# Patient Record
Sex: Female | Born: 1950 | Hispanic: No
Health system: Southern US, Community
[De-identification: ages and names within clinical notes are randomized; demographics above are authoritative.]

## PROBLEM LIST (undated history)

## (undated) DIAGNOSIS — S0990XA Unspecified injury of head, initial encounter: Secondary | ICD-10-CM

## (undated) DIAGNOSIS — E119 Type 2 diabetes mellitus without complications: Secondary | ICD-10-CM

## (undated) DIAGNOSIS — F419 Anxiety disorder, unspecified: Secondary | ICD-10-CM

## (undated) DIAGNOSIS — E785 Hyperlipidemia, unspecified: Secondary | ICD-10-CM

## (undated) DIAGNOSIS — F32A Depression, unspecified: Secondary | ICD-10-CM

## (undated) DIAGNOSIS — K219 Gastro-esophageal reflux disease without esophagitis: Secondary | ICD-10-CM

## (undated) DIAGNOSIS — F329 Major depressive disorder, single episode, unspecified: Secondary | ICD-10-CM

## (undated) HISTORY — DX: Hyperlipidemia, unspecified: E78.5

## (undated) HISTORY — PX: ABDOMINAL HYSTERECTOMY: SHX81

## (undated) HISTORY — DX: Depression, unspecified: F32.A

## (undated) HISTORY — DX: Anxiety disorder, unspecified: F41.9

## (undated) HISTORY — DX: Major depressive disorder, single episode, unspecified: F32.9

## (undated) HISTORY — PX: TUBAL LIGATION: SHX77

## (undated) HISTORY — PX: BACK SURGERY: SHX140

---

## 1998-06-04 ENCOUNTER — Other Ambulatory Visit: Admission: RE | Admit: 1998-06-04 | Discharge: 1998-06-04 | Payer: Self-pay | Admitting: Obstetrics and Gynecology

## 1999-02-13 ENCOUNTER — Inpatient Hospital Stay (HOSPITAL_COMMUNITY): Admission: RE | Admit: 1999-02-13 | Discharge: 1999-02-14 | Payer: Self-pay | Admitting: Neurosurgery

## 1999-02-13 ENCOUNTER — Encounter: Payer: Self-pay | Admitting: Neurosurgery

## 2000-03-24 ENCOUNTER — Other Ambulatory Visit: Admission: RE | Admit: 2000-03-24 | Discharge: 2000-03-24 | Payer: Self-pay | Admitting: Obstetrics and Gynecology

## 2000-04-12 ENCOUNTER — Encounter: Payer: Self-pay | Admitting: Neurology

## 2000-04-12 ENCOUNTER — Encounter: Admission: RE | Admit: 2000-04-12 | Discharge: 2000-04-12 | Payer: Self-pay | Admitting: Neurology

## 2000-05-25 ENCOUNTER — Inpatient Hospital Stay (HOSPITAL_COMMUNITY): Admission: EM | Admit: 2000-05-25 | Discharge: 2000-06-04 | Payer: Self-pay

## 2000-05-25 ENCOUNTER — Encounter: Payer: Self-pay | Admitting: General Surgery

## 2000-05-26 ENCOUNTER — Encounter: Payer: Self-pay | Admitting: General Surgery

## 2000-05-27 ENCOUNTER — Encounter: Payer: Self-pay | Admitting: General Surgery

## 2000-06-03 ENCOUNTER — Encounter: Payer: Self-pay | Admitting: General Surgery

## 2000-07-05 ENCOUNTER — Other Ambulatory Visit: Admission: RE | Admit: 2000-07-05 | Discharge: 2000-07-05 | Payer: Self-pay | Admitting: Gynecology

## 2001-01-28 ENCOUNTER — Emergency Department (HOSPITAL_COMMUNITY): Admission: EM | Admit: 2001-01-28 | Discharge: 2001-01-28 | Payer: Self-pay | Admitting: Emergency Medicine

## 2001-02-09 ENCOUNTER — Encounter: Admission: RE | Admit: 2001-02-09 | Discharge: 2001-02-09 | Payer: Self-pay

## 2001-04-11 ENCOUNTER — Encounter: Admission: RE | Admit: 2001-04-11 | Discharge: 2001-04-11 | Payer: Self-pay | Admitting: Internal Medicine

## 2001-05-30 ENCOUNTER — Inpatient Hospital Stay (HOSPITAL_COMMUNITY): Admission: AD | Admit: 2001-05-30 | Discharge: 2001-05-30 | Payer: Self-pay | Admitting: Obstetrics

## 2001-08-17 ENCOUNTER — Encounter: Admission: RE | Admit: 2001-08-17 | Discharge: 2001-08-17 | Payer: Self-pay

## 2001-08-17 ENCOUNTER — Encounter (INDEPENDENT_AMBULATORY_CARE_PROVIDER_SITE_OTHER): Payer: Self-pay | Admitting: Internal Medicine

## 2001-08-17 ENCOUNTER — Other Ambulatory Visit: Admission: RE | Admit: 2001-08-17 | Discharge: 2001-09-02 | Payer: Self-pay | Admitting: Obstetrics & Gynecology

## 2001-11-02 ENCOUNTER — Encounter: Admission: RE | Admit: 2001-11-02 | Discharge: 2001-11-02 | Payer: Self-pay

## 2001-11-14 ENCOUNTER — Ambulatory Visit (HOSPITAL_COMMUNITY): Admission: RE | Admit: 2001-11-14 | Discharge: 2001-11-14 | Payer: Self-pay | Admitting: Obstetrics

## 2002-01-05 ENCOUNTER — Emergency Department (HOSPITAL_COMMUNITY): Admission: EM | Admit: 2002-01-05 | Discharge: 2002-01-05 | Payer: Self-pay | Admitting: Emergency Medicine

## 2002-01-05 ENCOUNTER — Encounter: Payer: Self-pay | Admitting: Emergency Medicine

## 2002-06-09 ENCOUNTER — Encounter: Admission: RE | Admit: 2002-06-09 | Discharge: 2002-06-09 | Payer: Self-pay | Admitting: Internal Medicine

## 2002-08-18 ENCOUNTER — Emergency Department (HOSPITAL_COMMUNITY): Admission: EM | Admit: 2002-08-18 | Discharge: 2002-08-18 | Payer: Self-pay

## 2002-11-22 ENCOUNTER — Encounter: Admission: RE | Admit: 2002-11-22 | Discharge: 2002-11-22 | Payer: Self-pay | Admitting: Internal Medicine

## 2002-11-22 ENCOUNTER — Ambulatory Visit (HOSPITAL_COMMUNITY): Admission: RE | Admit: 2002-11-22 | Discharge: 2002-11-22 | Payer: Self-pay | Admitting: Internal Medicine

## 2002-11-22 ENCOUNTER — Encounter: Payer: Self-pay | Admitting: Internal Medicine

## 2003-01-05 ENCOUNTER — Emergency Department (HOSPITAL_COMMUNITY): Admission: EM | Admit: 2003-01-05 | Discharge: 2003-01-05 | Payer: Self-pay | Admitting: Emergency Medicine

## 2003-02-11 ENCOUNTER — Emergency Department (HOSPITAL_COMMUNITY): Admission: EM | Admit: 2003-02-11 | Discharge: 2003-02-11 | Payer: Self-pay | Admitting: Emergency Medicine

## 2003-02-15 ENCOUNTER — Encounter: Admission: RE | Admit: 2003-02-15 | Discharge: 2003-02-15 | Payer: Self-pay | Admitting: Internal Medicine

## 2003-02-27 ENCOUNTER — Encounter: Admission: RE | Admit: 2003-02-27 | Discharge: 2003-03-28 | Payer: Self-pay | Admitting: *Deleted

## 2003-03-28 ENCOUNTER — Encounter: Admission: RE | Admit: 2003-03-28 | Discharge: 2003-03-28 | Payer: Self-pay | Admitting: Internal Medicine

## 2003-11-12 ENCOUNTER — Emergency Department (HOSPITAL_COMMUNITY): Admission: EM | Admit: 2003-11-12 | Discharge: 2003-11-12 | Payer: Self-pay | Admitting: Emergency Medicine

## 2003-12-07 ENCOUNTER — Encounter: Admission: RE | Admit: 2003-12-07 | Discharge: 2003-12-07 | Payer: Self-pay | Admitting: Internal Medicine

## 2004-01-02 ENCOUNTER — Ambulatory Visit (HOSPITAL_COMMUNITY): Admission: RE | Admit: 2004-01-02 | Discharge: 2004-01-02 | Payer: Self-pay | Admitting: Internal Medicine

## 2005-01-12 ENCOUNTER — Emergency Department (HOSPITAL_COMMUNITY): Admission: EM | Admit: 2005-01-12 | Discharge: 2005-01-12 | Payer: Self-pay | Admitting: Family Medicine

## 2005-01-21 ENCOUNTER — Ambulatory Visit: Payer: Self-pay | Admitting: Internal Medicine

## 2005-05-22 ENCOUNTER — Ambulatory Visit: Payer: Self-pay | Admitting: Internal Medicine

## 2006-03-16 ENCOUNTER — Ambulatory Visit: Payer: Self-pay | Admitting: Internal Medicine

## 2006-03-25 ENCOUNTER — Ambulatory Visit (HOSPITAL_COMMUNITY): Admission: RE | Admit: 2006-03-25 | Discharge: 2006-03-25 | Payer: Self-pay | Admitting: Internal Medicine

## 2006-04-07 ENCOUNTER — Ambulatory Visit: Payer: Self-pay | Admitting: Internal Medicine

## 2006-04-23 ENCOUNTER — Ambulatory Visit: Payer: Self-pay | Admitting: Internal Medicine

## 2006-05-24 ENCOUNTER — Ambulatory Visit: Payer: Self-pay | Admitting: Internal Medicine

## 2006-09-28 ENCOUNTER — Encounter (INDEPENDENT_AMBULATORY_CARE_PROVIDER_SITE_OTHER): Payer: Self-pay | Admitting: Internal Medicine

## 2006-09-28 DIAGNOSIS — M549 Dorsalgia, unspecified: Secondary | ICD-10-CM | POA: Insufficient documentation

## 2006-09-28 DIAGNOSIS — F411 Generalized anxiety disorder: Secondary | ICD-10-CM | POA: Insufficient documentation

## 2006-09-28 DIAGNOSIS — F3289 Other specified depressive episodes: Secondary | ICD-10-CM | POA: Insufficient documentation

## 2006-09-28 DIAGNOSIS — J309 Allergic rhinitis, unspecified: Secondary | ICD-10-CM | POA: Insufficient documentation

## 2006-09-28 DIAGNOSIS — F329 Major depressive disorder, single episode, unspecified: Secondary | ICD-10-CM

## 2006-09-28 DIAGNOSIS — N951 Menopausal and female climacteric states: Secondary | ICD-10-CM

## 2006-11-02 DIAGNOSIS — E785 Hyperlipidemia, unspecified: Secondary | ICD-10-CM | POA: Insufficient documentation

## 2006-11-24 ENCOUNTER — Telehealth: Payer: Self-pay | Admitting: *Deleted

## 2006-12-23 ENCOUNTER — Ambulatory Visit: Payer: Self-pay | Admitting: Internal Medicine

## 2006-12-23 ENCOUNTER — Encounter (INDEPENDENT_AMBULATORY_CARE_PROVIDER_SITE_OTHER): Payer: Self-pay | Admitting: Unknown Physician Specialty

## 2006-12-23 DIAGNOSIS — K219 Gastro-esophageal reflux disease without esophagitis: Secondary | ICD-10-CM | POA: Insufficient documentation

## 2006-12-23 DIAGNOSIS — R209 Unspecified disturbances of skin sensation: Secondary | ICD-10-CM

## 2007-03-24 ENCOUNTER — Telehealth: Payer: Self-pay | Admitting: *Deleted

## 2007-03-28 ENCOUNTER — Ambulatory Visit (HOSPITAL_COMMUNITY): Admission: RE | Admit: 2007-03-28 | Discharge: 2007-03-28 | Payer: Self-pay | Admitting: Internal Medicine

## 2007-05-13 ENCOUNTER — Telehealth: Payer: Self-pay | Admitting: *Deleted

## 2007-05-31 ENCOUNTER — Ambulatory Visit: Payer: Self-pay | Admitting: Internal Medicine

## 2007-09-08 ENCOUNTER — Telehealth: Payer: Self-pay | Admitting: *Deleted

## 2007-09-20 ENCOUNTER — Telehealth: Payer: Self-pay | Admitting: Internal Medicine

## 2008-01-05 ENCOUNTER — Encounter (INDEPENDENT_AMBULATORY_CARE_PROVIDER_SITE_OTHER): Payer: Self-pay | Admitting: *Deleted

## 2008-01-05 ENCOUNTER — Ambulatory Visit: Payer: Self-pay | Admitting: Infectious Diseases

## 2008-01-13 ENCOUNTER — Encounter (INDEPENDENT_AMBULATORY_CARE_PROVIDER_SITE_OTHER): Payer: Self-pay | Admitting: *Deleted

## 2008-01-13 ENCOUNTER — Ambulatory Visit: Payer: Self-pay | Admitting: Hospitalist

## 2008-01-16 LAB — CONVERTED CEMR LAB
HDL: 54 mg/dL (ref >39)
LDL Cholesterol: 95 mg/dL (ref 0–99)

## 2008-03-28 ENCOUNTER — Ambulatory Visit (HOSPITAL_COMMUNITY): Admission: RE | Admit: 2008-03-28 | Discharge: 2008-03-28 | Payer: Self-pay | Admitting: Internal Medicine

## 2008-03-28 LAB — HM MAMMOGRAPHY: HM Mammogram: NEGATIVE

## 2008-06-08 ENCOUNTER — Telehealth (INDEPENDENT_AMBULATORY_CARE_PROVIDER_SITE_OTHER): Payer: Self-pay | Admitting: Internal Medicine

## 2008-09-25 ENCOUNTER — Telehealth: Payer: Self-pay | Admitting: Internal Medicine

## 2008-10-04 ENCOUNTER — Telehealth: Payer: Self-pay | Admitting: *Deleted

## 2008-11-26 ENCOUNTER — Encounter: Payer: Self-pay | Admitting: Internal Medicine

## 2008-11-26 ENCOUNTER — Ambulatory Visit: Payer: Self-pay | Admitting: Internal Medicine

## 2008-11-26 ENCOUNTER — Telehealth: Payer: Self-pay | Admitting: *Deleted

## 2008-11-29 DIAGNOSIS — S0990XA Unspecified injury of head, initial encounter: Secondary | ICD-10-CM | POA: Insufficient documentation

## 2009-01-07 ENCOUNTER — Telehealth: Payer: Self-pay | Admitting: *Deleted

## 2009-01-22 ENCOUNTER — Telehealth (INDEPENDENT_AMBULATORY_CARE_PROVIDER_SITE_OTHER): Payer: Self-pay | Admitting: Internal Medicine

## 2009-04-03 ENCOUNTER — Telehealth: Payer: Self-pay | Admitting: Internal Medicine

## 2010-07-28 ENCOUNTER — Ambulatory Visit: Payer: Self-pay | Admitting: Internal Medicine

## 2010-07-29 LAB — CONVERTED CEMR LAB
ALT: 35 units/L (ref 0–35)
Albumin: 4.3 g/dL (ref 3.5–5.2)
CO2: 28 meq/L (ref 19–32)
Chloride: 104 meq/L (ref 96–112)
Cholesterol: 230 mg/dL — ABNORMAL HIGH (ref 0–200)
MCV: 79.7 fL (ref >=78.0)
Platelets: 276 10*3/uL (ref 150–400)
Potassium: 4 meq/L (ref 3.5–5.3)
Sodium: 143 meq/L (ref 135–145)
Total Bilirubin: 0.6 mg/dL (ref 0.3–1.2)
Total CHOL/HDL Ratio: 4.2
Total Protein: 6.9 g/dL (ref 6.0–8.3)
Triglycerides: 73 mg/dL (ref <150)
WBC: 5.9 10*3/uL (ref 4.0–10.5)

## 2010-07-31 ENCOUNTER — Telehealth: Payer: Self-pay | Admitting: Internal Medicine

## 2010-10-29 ENCOUNTER — Encounter: Payer: Self-pay | Admitting: Internal Medicine

## 2010-11-25 NOTE — Assessment & Plan Note (Signed)
Summary: EST-CK/FU/MEDS/CFB   Vital Signs:  Patient profile:   60 year old female Height:      64 inches (162.56 cm) Weight:      201.6 pounds (91.64 kg) BMI:     34.73 Temp:     97.1 degrees F (36.17 degrees C) oral Pulse rate:   75 / minute BP sitting:   118 / 81  (right arm)  Vitals Entered By: Stanton Kidney Ditzler RN (July 28, 2010 9:15 AM) Is Patient Diabetic? No Pain Assessment Patient in pain? yes     Location: head and right arm Intensity: 3 Type: nagging Onset of pain  past year Nutritional Status BMI of > 30 = obese Nutritional Status Detail appetite good  Have you ever been in a relationship where you felt threatened, hurt or afraid?denies   Does patient need assistance? Functional Status Self care Ambulation Normal Comments Needs med for seasonal allergies. Discuss right arm pain, h/a and back pain. Refills on meds.   Primary Care Provider:  Laren Everts MD   History of Present Illness: 60 yr old woman with pmhx as described below comes to the clinic for follow up. Reports that now that is getiing cold her back is acting up. Denies any bowel or bladder incontinence. Reports that she hasn't taken anything for pain in about 7 months. Her pain was controlled with Ibuprofen.   Patient had a Colonoscopy in 2009 which was normal.   Depression History:      The patient denies a depressed mood most of the day and a diminished interest in her usual daily activities.         Preventive Screening-Counseling & Management  Alcohol-Tobacco     Smoking Status: never  Caffeine-Diet-Exercise     Does Patient Exercise: no     Type of exercise:   GYM  Problems Prior to Update: 1)  Head Trauma, Mild  (ICD-959.01) 2)  Fall From Other Slipping Tripping or Stumbling  (ICD-E885.9) 3)  Hx of Gerd  (ICD-530.81) 4)  Hx of Numbness  (ICD-782.0) 5)  Hyperlipidemia  (ICD-272.4) 6)  Hot Flashes  (ICD-627.2) 7)  Screen For Condition Nos  (ICD-V82.9) 8)  Back Pain   (ICD-724.5) 9)  Depression  (ICD-311) 10)  Anxiety  (ICD-300.00) 11)  Allergic Rhinitis  (ICD-477.9)  Medications Prior to Update: 1)  Zyrtec  Tabs (Cetirizine Hcl Tabs) .... Take 1 Tablet By Mouth Once A Day 2)  Xanax 0.25 Mg Tabs (Alprazolam) .... Two Times A Day As Needed 3)  Zocor 10 Mg Tabs (Simvastatin) .... Take 1 Tablet By Mouth Once A Day 4)  Ibuprofen 800 Mg Tabs (Ibuprofen) .... Take 1 Tablet By Mouth Three Times A Day As Needed For Pain 5)  Neurontin 300 Mg Caps (Gabapentin) .... Take 1 Tab At Bedtime 6)  Prilosec 20 Mg  Cpdr (Omeprazole) .... Take 1 Tablet By Mouth Once A Day  Current Medications (verified): 1)  Zocor 10 Mg Tabs (Simvastatin) .... Take 1 Tablet By Mouth Once A Day 2)  Ibuprofen 800 Mg Tabs (Ibuprofen) .... Take 1 Tablet By Mouth Three Times A Day As Needed For Pain  Allergies: 1)  ! Aspirin  Past History:  Past Medical History: Last updated: 09/28/2006 Allergic rhinitis Anxiety Depression Hyperlipidemia  Social History: Last updated: 01/05/2008 Works as nanny  Risk Factors: Exercise: no (07/28/2010)  Risk Factors: Smoking Status: never (07/28/2010)  Review of Systems       The patient complains of weight gain.  The  patient denies fever, chest pain, dyspnea on exertion, hemoptysis, abdominal pain, melena, hematochezia, hematuria, and difficulty walking.    Physical Exam  General:  Well-developed,well-nourished,in no acute distress; alert,appropriate and cooperative throughout examination Mouth:  normal OP Neck:  supple and no masses.   Lungs:  normal respiratory effort, normal breath sounds, and no wheezes.   Heart:  normal rate, regular rhythm, and no murmur.   Abdomen:  soft, non-tender, and normal bowel sounds.   Msk:  ttp lumbar paraspinal area, no point tenderness, no joint swelling, no joint warmth, no redness over joints, no joint deformities, and no joint instability.   Extremities:  no edema Neurologic:   Nonfocal   Impression & Recommendations:  Problem # 1:  BACK PAIN (ICD-724.5) Stable. No red flags concerns for cauda equina syndrome.  Her updated medication list for this problem includes:    Ibuprofen 800 Mg Tabs (Ibuprofen) .Marland Kitchen... Take 1 tablet by mouth three times a day as needed for pain  Problem # 2:  HYPERLIPIDEMIA (ICD-272.4) Check labs and restart patient on statin.   Her updated medication list for this problem includes:    Zocor 10 Mg Tabs (Simvastatin) .Marland Kitchen... Take 1 tablet by mouth once a day  Orders: T-CMP with Estimated GFR ([id number]) T-Lipid Profile ([id number]) T-CBC No Diff ([id number]) T-TSH ([id number])  Problem # 3:  Preventive Health Care (ICD-V70.0) Will order Mammogram. Colonsocopy uptodate.  Complete Medication List: 1)  Zocor 10 Mg Tabs (Simvastatin) .... Take 1 tablet by mouth once a day 2)  Ibuprofen 800 Mg Tabs (Ibuprofen) .... Take 1 tablet by mouth three times a day as needed for pain  Other Orders: Mammogram (Screening) (Mammo)  Patient Instructions: 1)  Please schedule a follow-up appointment in 6 months. 2)  Take all medication as directed. 3)  You will be called with any abnormalities in the tests scheduled or performed today.  If you don't hear from [place] within a week from when the test was performed, you can assume that your test was normal.  Prescriptions: ZOCOR 10 MG TABS (SIMVASTATIN) Take 1 tablet by mouth once a day  #30 x 3   Entered and Authorized by:   Laren Everts MD   Signed by:   Laren Everts MD on 07/28/2010   Method used:   Print then Give to Patient   RxID:   [id number] IBUPROFEN 800 MG TABS (IBUPROFEN) Take 1 tablet by mouth three times a day as needed for pain  #90 Tablet x 6   Entered and Authorized by:   Laren Everts MD   Signed by:   Laren Everts MD on 07/28/2010   Method used:   Print then Give to Patient   RxID:   [id number]  Process Orders Tests Sent for  requisitioning (July 28, 2010 9:11 PM):     07/28/2010: [facility] -- T-CMP with Estimated GFR [[id number]] (signed)     07/28/2010: [facility] -- T-Lipid Profile [id number] (signed)     07/28/2010: [facility] -- T-CBC No Diff [[id number]] (signed)     07/28/2010: [facility] -- T-TSH [[id number]] (signed)    Prevention & Chronic Care Immunizations   Influenza vaccine: Not documented   Influenza vaccine deferral: Refused  (07/28/2010)    Tetanus booster: Not documented    Pneumococcal vaccine: Not documented  Colorectal Screening   Hemoccult: Not documented    Colonoscopy: Not documented  Other Screening   Pap smear: Normal  (08/17/2001)    Mammogram: Assessment:  BIRADS 1. [facility]     (03/28/2008)   Mammogram action/deferral: Ordered  (07/28/2010)   Smoking status: never  (07/28/2010)  Lipids   Total Cholesterol: 161  (01/13/2008)   LDL: 95  (01/13/2008)   LDL Direct: Not documented   HDL: 54  (01/13/2008)   Triglycerides: 59  (01/13/2008)    SGOT (AST): Not documented   SGPT (ALT): Not documented   Alkaline phosphatase: Not documented   Total bilirubin: Not documented    Lipid flowsheet reviewed?: Yes   Progress toward LDL goal: Unchanged  Self-Management Support :   Personal Goals (by the next clinic visit) :      Personal LDL goal: 100  (07/28/2010)    Patient will work on the following items until the next clinic visit to reach self-care goals:     Medications and monitoring: take my medicines every day, bring all of my medications to every visit  (07/28/2010)     Eating: eat more vegetables, use fresh or frozen vegetables, eat foods that are low in salt, eat fruit for snacks and desserts  (07/28/2010)     Activity: take a 30 minute walk every day  (07/28/2010)    Lipid self-management support: Written self-care plan, Education handout, Resources for patients  handout  (07/28/2010)   Lipid self-care plan printed.   Lipid education handout printed      Resource handout printed.   Nursing Instructions: Schedule screening mammogram (see order)   Process Orders Tests Sent for requisitioning (July 28, 2010 9:11 PM):     07/28/2010: [facility] -- T-CMP with Estimated GFR [[id number]] (signed)     07/28/2010: [facility] -- T-Lipid Profile [id number] (signed)     07/28/2010: [facility] -- T-CBC No Diff [[id number]] (signed)     07/28/2010: [facility] -- T-TSH [id number] (signed)

## 2010-11-25 NOTE — Progress Notes (Signed)
Summary: REfill/gh  Phone Note Refill Request Message from:  Patient on July 31, 2010 4:36 PM  Refills Requested: Medication #1:  Gabapentin  Method Requested: Electronic Initial call taken by: Angelina Ok RN,  July 31, 2010 4:41 PM  Follow-up for Phone Call        Not on med list. Will send to Dr Cena Benton as he just saw pt. Follow-up by: Blanch Media MD,  August 01, 2010 10:50 AM    New/Updated Medications: NEURONTIN 300 MG CAPS (GABAPENTIN) Take 1 tab by mouth at bedtime Prescriptions: NEURONTIN 300 MG CAPS (GABAPENTIN) Take 1 tab by mouth at bedtime  #30 x 2   Entered and Authorized by:   Laren Everts MD   Signed by:   Laren Everts MD on 08/01/2010   Method used:   Electronically to        [facility]  [address] [phone].* (retail)       [address]       [place], [place]  [address]       Ph: [phone] or [phone]       Fax: [phone]   RxID:   [id number]   Appended Document: REfill/gh Pt. called about Gabapentin Rx ; informed her the Rx was electronic sent Oct. 7.

## 2010-11-27 NOTE — Letter (Signed)
Summary: [facility]  [facility]   Imported By: Shon Hough 11/07/2010 12:31:47  _____________________________________________________________________  External Attachment:    Type:   Image     Comment:   External Document

## 2010-12-19 ENCOUNTER — Other Ambulatory Visit: Payer: Self-pay | Admitting: Internal Medicine

## 2011-01-22 ENCOUNTER — Ambulatory Visit: Payer: Self-pay | Admitting: Internal Medicine

## 2011-01-29 ENCOUNTER — Encounter: Payer: Self-pay | Admitting: Internal Medicine

## 2011-02-06 ENCOUNTER — Ambulatory Visit: Payer: Self-pay | Admitting: Internal Medicine

## 2011-03-13 ENCOUNTER — Encounter: Payer: Self-pay | Admitting: Internal Medicine

## 2011-03-13 NOTE — Discharge Summary (Signed)
[facility]  Patient:    Teresa [name], Teresa [name]                     MRN: [id number] Adm. Date:  [id number] Disc. Date: [id number] Attending:  Henrene Dodge                           Discharge Summary  DISCHARGE DIAGNOSES: 1. Small-bowel obstruction secondary to adhesions. 2. "Hepatitis."  OPERATIONS PERFORMED:  Exploratory laparotomy and lysis of adhesions for mechanical small-bowel obstruction under general anesthesia.  HISTORY OF PRESENT ILLNESS:  Teresa [name] is a 60 year old black female three years status post vaginal hysterectomy who had a 36-hour history of abdominal pain, bloating, nausea, and vomiting.  She has had a chronic problem with gas and constipation.  She takes Correctol on a fairly regular basis about once a week.  She presented here approximately 8 a.m. complaining of cramping, abdominal pain, and was seen, evaluated, and had a CT with p.o. contrast.  There appeared to be a small-bowel obstruction in the distal small bowel.  ADMISSION MEDICATIONS:  She is on no chronic medications.  ALLERGIES:  She has no allergies.  HOSPITAL COURSE:  On admission she had a large amount of stool in her rectum and was given soap suds enemas and appeared improved.  The following morning a KUB showed an improved gas pattern, and I thought clinically she was definitely improving.  There was a large amount of bile from the nasogastric tube, and then repeat enemas had very little additional stool.  The following day the KUB still showed a loop of dilated small intestine, and there was still bile in the nasogastric tube.  At this time we thought that really we were not improving. The initial improvement was really because of the large amount of nasogastric drainage that had been removed with the NG tube. Therefore, on May 27, 2000, she was taken to surgery.  Dr. Carolynne Edouard assisted, and at time of surgery she was noted to have kind of a band sort of  low down in the pelvis and how this had been possibly related to the vaginal hysterectomy I am not sure, but it was thought to be the point of obstruction. Postoperatively, she did satisfactorily, did not have any flatus for the first several days, NG tube was continued, and she had NG drainage that decreased in amounts.  We thought we had basically kind of a postoperative ileus, and on approximately the third or fourth postoperative day she started passing flatus and clinically was definitely better.  Repeat lab studies however at time we noted that really we were having a change in her liver function studies, and even though the patient was not really symptomatic at this time because of the abnormal liver tests, I did send her down for an ultrasound of the gallbladder which did not show any stones and we did not see any dilated common bile duct. The patients admission liver studies had been normal.  Alkaline phosphatase of 19, total bilirubin of 0.8.  Amylase had been 66, but then on August 9 her alkaline phosphatase was 460.  Bilirubin actually went as high as 4.7. Clinically, the patient was completely asymptomatic at this time and wanting to go home.  Her abdomen was soft, her incision appeared to be healed nicely, and she is aware that there may be an abnormality going on in the liver.  If this is possibly related  to some type of medications we are not sure exactly what as she only had antibiotics in the perioperative period and besides pain medication postoperatively was not on a lot of medications.  The patient stated that what she would prefer is to be seen in my office in approximately three days and will repeat a liver test at that time.  She has not had any nausea, vomiting, or symptoms like sort of a hepatitis and do not have a definite explanation for the elevated liver test in this postoperative period.   CONDITION ON DISCHARGE:  The patient was discharged on June 04, 2000, in improved condition.  DISCHARGE MEDICATIONS:  States that she does not need any strong pain medication.  Will use Tylenol, but she is aware that she should not take over six or eight a day especially in this elevated liver test findings.  DISCHARGE INSTRUCTIONS:  She will have repeat liver profile drawn in approximately three days and be seen in my office later that day.  The patient presently is not working since she has had some back problems which I think she is out of work for, but I do not think that that is contributing to her acute problem this hospitalization. DD:  07/09/00 TD:  07/12/00 Job: [id number]

## 2011-03-13 NOTE — Op Note (Signed)
[facility]  Patient:    Teresa [name], Teresa [name]                     MRN: [id number] Proc. Date: 05/27/00 Adm. Date:  [id number] Attending:  Henrene Dodge                           Operative Report  PREOPERATIVE DIAGNOSIS:  Small-bowel obstruction with probably adhesions.  POSTOPERATIVE DIAGNOSIS:  Small-bowel obstruction with probably adhesions.  OPERATION PERFORMED:  Exploratory laparotomy and lysis of adhesions for mechanical small-bowel obstruction.  SURGEON:  Anselm Pancoast. Zachery Dakins, M.D.  ASSISTANT:  Hardie Shackleton, M.D.  ANESTHESIA:  General.  BRIEF HISTORY:  Teresa [name] is a 60 year old female who was admitted to the emergency room on June 31, 2001 with an approximately two day history of progressive severe bleeding, cramping, nausea, vomiting and abdominal pain. 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  The patient was nauseated and given pain medications, and x-rays were obtained which appeared to be dilated small bowel and then a CT was performed and this showed basically a very dilated proximal small bowel and decompressed distal small bowel, and it was felt that this was a high-grade partial blockage.  I saw her and an NG tube was placed, and we repeated the x-ray later that evening and could not tell whether this was better or not.  The patient states she had felt better after the NG tube was placed, therefore we waited an additional 12 hours and repeated the abdominal films.  From these, they thought it was definitely better.  There was some gas in the transverse colon.  The small-bowel loops were certainly not as dilated.  She was however having a large amount of NG drainage that was quite bilious.  We waited another 24 hours and repeated x-rays, and  she still has dilated loops of proximal small bowel, still bilious NG drainage and I recommended we proceed on with exploratory laparotomy.  On the CT, you could see that there was a little weakness at the umbilicus, but I could not appreciate an actual hernia on clinical physical exam.  DESCRIPTION OF PROCEDURE:  The patient preoperatively was given 3 g of Unasyn, PAS stockings and permit for operation performed.  Induction general anesthesia.  Endotracheal tube position.  She already had an NG tube.  A Foley catheter was inserted sterilely.  The abdomen was prepped with Betadine surgical scrub solution and draped in a sterile manner.  A small incision above and below the umbilicus was made.  Sharp dissection down through the skin identifying the fascia.  I made a small opening into the peritoneum just above the umbilicus and placed my finger in the peritoneal cavity and then opened up the fascia right at the midline through the umbilical hernia.  The small bowel proximally was very dilated and I made the incision just big enough to get my hand in and I could feel the pulling of obstruction down as the CT had shown right below the umbilicus slightly to the right of the midline.  I could cut a single band that was the actual focus point of the adhesions and there were numerous other kind of chronic adhesions that were all taken down carefully and freeing up the  small bowel.  The adhesions were really not down into the true pelvis like you would expect if it was somehow another related to the vaginal hysterectomy, but this area was not incarcerated up into the umbilical fascia defect either.  The appendix we inspected that and it was normal by inspection.  I did not remove it.  I could not find any other abnormalities.  We ran the small bowel and then placed it back into its normal anatomical position and then brought the omentum down over the small bowel.  First I removed the umbilical  hernia sac and dissected back to good fascia on both sides and then closed this kind of small midline incision with interrupted sutures of 0 Prolene.  The skin was then closed with staples.  The patient tolerated the procedure nicely.  We will keep the NG tube and Foley in, and hopefully she will start having bowel function in a couple of days.  We will use PCA morphine of postoperative pain control and plan on giving her another couple of doses of Unasyn and discharge on this.  Estimated blood loss is minimal.  Sponge and needle counts were correct x 2. DD:  05/27/00 TD:  05/30/00 Job: [id number]

## 2011-03-13 NOTE — H&P (Signed)
[facility]  Patient:    Teresa [name], Teresa [name]                    MRN: [id number] Adm. Date:  05/25/00 Attending:  Anselm Pancoast. Zachery Dakins, M.D. CC:         Anselm Pancoast. Zachery Dakins, M.D.                         History and Physical  CHIEF COMPLAINT:  Nausea, vomiting and abdominal pain.  HISTORY OF PRESENT ILLNESS:  Teresa [name] is a 60 year old black female, who presented to the emergency room with approximately a 36-hour history of bloating, gaseous and significant nausea and vomiting.  The patient states that she has had kind of a sluggish bowel function, and occasionally takes Correctol but has never had any symptoms like this, and I think she has vomited approximately six times before presenting the emergency room.  She was seen by the ER physician and on abdominal examination she was somewhat bloated.  A plain abdominal film was obtained that showed some dilated small bowel loops with some gas in the colon.  The CT was performed next and this showed a high grade partial obstruction with a very dilated by the first half of the small bowel and then decompressed in the distal portion and it appears that the transition appeared sort of below the umbilicus kind of in the mesentery of the iliac area ileum.  She had sort of a small umbilical hernia but there was no loops of bowel up in this.  The patient states that she has had no abdominal surgery with the exception of the vaginal hysterectomy.  ALLERGIES:  She states she is allergic to ASPIRIN.  CHRONIC MEDICATIONS:  I do not think she was actually on any.  REVIEW OF SYSTEMS:  EYES, EARS, NOSE, AND THROAT:  Denies symptoms. CARDIAC:  Denies high blood pressure or angina.  PULMONARY:  She does not smoke.  GASTROINTESTINAL:  No chronic problems except for a little chronic constipation.  GYNECOLOGIC:  She has had a vaginal hysterectomy.  Her ovaries are still in place.  FAMILY HISTORY:  I think she has two  children.  She is married.  Her husband works for the city.  PHYSICAL EXAMINATION:  GENERAL:  She is a slightly overweight, pleasant, black female in no real acute distress, but a little uncomfortable and an NG tube has just been placed.  VITAL SIGNS:  Blood pressure 143/98, pulse 84, respirations 24, temperature 98.5.  HEENT:  Eyes, ears, nose, and throat appear adequately hydrated.  Throat: Nodes:  No cervical or supraclavicular.  No bruits.  LUNGS:  Clear.  CARDIAC:  Normal sinus rhythm.  BREASTS:  Unremarkable.  ABDOMEN:  She has some bowel sounds and there is no quadrant of her abdomen that is acutely tender.  I do not appreciate an umbilical hernia.  RECTAL:  Examination is unremarkable.  Stool is minimal but Hemoccult negative.  EXTREMITIES:  Unremarkable.  Good peripheral pulses and no edema.  CNS:  Physiologic.  ADMITTING IMPRESSION:  Partial small-bowel obstruction probably secondary to adhesions.  PLAN:  The patient has an NG tube and has been started on intravenous fluids. We will repeat an abdominal x-ray in approximately six hours and make a decision as to whether this is a complete or partial obstruction and whether surgery will or will not be urgently needed. DD:  05/27/00 TD:  05/28/00 Job: [id number]

## 2011-03-15 ENCOUNTER — Encounter: Payer: Self-pay | Admitting: Internal Medicine

## 2011-04-22 ENCOUNTER — Other Ambulatory Visit: Payer: Self-pay | Admitting: *Deleted

## 2011-04-22 MED ORDER — GABAPENTIN 300 MG PO CAPS: 300.0000 mg | ORAL_CAPSULE | Freq: Every day | ORAL | Status: DC

## 2011-04-22 MED ORDER — IBUPROFEN 800 MG PO TABS: 800.0000 mg | ORAL_TABLET | Freq: Three times a day (TID) | ORAL | Status: DC | PRN

## 2011-04-22 MED ORDER — SIMVASTATIN 10 MG PO TABS: 10.0000 mg | ORAL_TABLET | Freq: Every day | ORAL | Status: DC

## 2011-12-09 ENCOUNTER — Encounter: Payer: Self-pay | Admitting: Internal Medicine

## 2011-12-09 ENCOUNTER — Ambulatory Visit (INDEPENDENT_AMBULATORY_CARE_PROVIDER_SITE_OTHER): Payer: Self-pay | Admitting: Internal Medicine

## 2011-12-09 DIAGNOSIS — R200 Anesthesia of skin: Secondary | ICD-10-CM

## 2011-12-09 DIAGNOSIS — N971 Female infertility of tubal origin: Secondary | ICD-10-CM

## 2011-12-09 DIAGNOSIS — E669 Obesity, unspecified: Secondary | ICD-10-CM

## 2011-12-09 DIAGNOSIS — Z79899 Other long term (current) drug therapy: Secondary | ICD-10-CM | POA: Diagnosis not present

## 2011-12-09 DIAGNOSIS — G8929 Other chronic pain: Secondary | ICD-10-CM | POA: Diagnosis not present

## 2011-12-09 DIAGNOSIS — Z0000 Encounter for general adult medical examination without abnormal findings: Secondary | ICD-10-CM

## 2011-12-09 DIAGNOSIS — M549 Dorsalgia, unspecified: Secondary | ICD-10-CM

## 2011-12-09 DIAGNOSIS — R030 Elevated blood-pressure reading, without diagnosis of hypertension: Secondary | ICD-10-CM

## 2011-12-09 DIAGNOSIS — E785 Hyperlipidemia, unspecified: Secondary | ICD-10-CM

## 2011-12-09 DIAGNOSIS — N951 Menopausal and female climacteric states: Secondary | ICD-10-CM

## 2011-12-09 DIAGNOSIS — R209 Unspecified disturbances of skin sensation: Secondary | ICD-10-CM

## 2011-12-09 LAB — POCT GLYCOSYLATED HEMOGLOBIN (HGB A1C): Hemoglobin A1C: 5.7

## 2011-12-09 LAB — LIPID PANEL
Cholesterol: 184 mg/dL (ref 0–200)
HDL: 60 mg/dL (ref >39)
Total CHOL/HDL Ratio: 3.1 Ratio

## 2011-12-09 MED ORDER — SIMVASTATIN 10 MG PO TABS: 10.0000 mg | ORAL_TABLET | Freq: Every day | ORAL | Status: DC

## 2011-12-09 MED ORDER — IBUPROFEN 800 MG PO TABS: 800.0000 mg | ORAL_TABLET | Freq: Three times a day (TID) | ORAL | Status: DC | PRN

## 2011-12-09 MED ORDER — GABAPENTIN 300 MG PO CAPS: 300.0000 mg | ORAL_CAPSULE | Freq: Every day | ORAL | Status: DC

## 2011-12-09 NOTE — Progress Notes (Signed)
Subjective:     Patient ID: Teresa [name], female   DOB: May 05, 1951, 61 y.o.   MRN: [id number]  HPI Teresa [name] presents to clinic for refill of medications. He is requesting that her medications be transferred to a mail order pharmacy. I would like to have her mammogram conducted at [facility]. Reports that she has recently been taking Zyrtec-D for sinus congestion.  Review of Systems  Constitutional: Negative for fever.  HENT: Positive for congestion and rhinorrhea. Negative for sneezing.   Respiratory: Negative for chest tightness and shortness of breath.   Cardiovascular: Negative for chest pain.  Genitourinary: Negative for difficulty urinating and pelvic pain.  Musculoskeletal: Positive for back pain.  Neurological: Negative for dizziness and weakness.  Psychiatric/Behavioral: Negative for dysphoric mood.       Objective:   Physical Exam General: Vital signs reviewed and noted. Well-developed, well-nourished, in no acute distress; alert, appropriate and cooperative throughout examination. Head: Normocephalic, atraumatic. Eyes: PERRL, EOMI, No signs of anemia or jaundince. Nose: Mucous membranes moist, not inflammed, nonerythematous. Throat: Oropharynx nonerythematous, no exudate appreciated.  Neck: No deformities, masses, or tenderness noted.Supple, No carotid Bruits, no JVD. Lungs: Normal respiratory effort. Clear to auscultation BL without crackles or wheezes. Heart: RRR. S1 and S2 normal without gallop, murmur, or rubs. Abdomen: BS normoactive. Soft, Nondistended, non-tender. No masses or organomegaly. Extremities: No pretibial edema. Neurologic: nonfocal       Assessment:         Plan:     #1 hyperlipidemia: Recheck her lipid panel today and refill her simvastatin 10 mg per her mail order pharmacy  #2 chronic back pain: Stable on 800 mg Motrin Q8 hours  #3 lower extremity numbness: Patient reports her symptoms are stable on gabapentin 300 mg  daily  #4 elevated blood pressure: Blood pressure is 150/88 today. Have asked the patient to recheck her blood pressure for the next several days and call Monday with the report. Of note the patient has been taking Zyrtec D. for sinus congestion. Will defer antihypertensive medication for now.  #5 disposition: Patient reports that she has not followed up in the clinic since 2011 because she has moved to [facility] but wishes to continue her care at this clinic. I have given her referral for mammogram at [facility]

## 2011-12-09 NOTE — Assessment & Plan Note (Signed)
Stable on gabapentin.   

## 2011-12-09 NOTE — Assessment & Plan Note (Signed)
We'll check a lipid panel today and continue Zocor

## 2011-12-09 NOTE — Patient Instructions (Signed)
It was nice to meet you Teresa [name]. As we discussed your blood pressure was high today. Over the next several days please check your blood pressure at a local drugstore and right down the numbers. Call our clinic on Monday and leave a message with the numbers. Your blood pressure is still too high I will need to see you back again in 3 months. Until then, I would like you to resume your diet and exercise which will help to lower your blood pressure. I will also give you a prescription for screening mammogram. In addition prescriptions were called into your mail order pharmacy.

## 2011-12-09 NOTE — Assessment & Plan Note (Signed)
Stable patient reports that they have not worsened since last visit in 2011.

## 2011-12-09 NOTE — Assessment & Plan Note (Signed)
Stable on 800 mg Motrin

## 2011-12-15 ENCOUNTER — Telehealth: Payer: Self-pay | Admitting: *Deleted

## 2011-12-15 NOTE — Telephone Encounter (Signed)
Pt called to report Blood Pressure Readings. 2/14    126/78 2/15    125/75   Pt's number [phone]

## 2012-03-14 ENCOUNTER — Ambulatory Visit: Payer: Self-pay

## 2012-03-14 DIAGNOSIS — Z1231 Encounter for screening mammogram for malignant neoplasm of breast: Secondary | ICD-10-CM | POA: Diagnosis not present

## 2012-10-06 ENCOUNTER — Encounter: Payer: Self-pay | Admitting: Internal Medicine

## 2012-10-06 DIAGNOSIS — Z9889 Other specified postprocedural states: Secondary | ICD-10-CM

## 2012-10-06 DIAGNOSIS — Z9181 History of falling: Secondary | ICD-10-CM | POA: Insufficient documentation

## 2012-10-06 HISTORY — DX: History of falling: Z91.81

## 2012-10-06 HISTORY — DX: Other specified postprocedural states: Z98.890

## 2012-12-05 ENCOUNTER — Encounter: Payer: Medicare Other | Admitting: Internal Medicine

## 2012-12-12 ENCOUNTER — Encounter: Payer: Self-pay | Admitting: Internal Medicine

## 2012-12-12 ENCOUNTER — Ambulatory Visit (INDEPENDENT_AMBULATORY_CARE_PROVIDER_SITE_OTHER): Payer: Medicare Other | Admitting: Internal Medicine

## 2012-12-12 VITALS — BP 130/80 | HR 75 | Temp 97.0°F | Ht 64.5 in | Wt 192.1 lb

## 2012-12-12 DIAGNOSIS — F411 Generalized anxiety disorder: Secondary | ICD-10-CM | POA: Diagnosis not present

## 2012-12-12 DIAGNOSIS — J309 Allergic rhinitis, unspecified: Secondary | ICD-10-CM

## 2012-12-12 DIAGNOSIS — F329 Major depressive disorder, single episode, unspecified: Secondary | ICD-10-CM

## 2012-12-12 DIAGNOSIS — R202 Paresthesia of skin: Secondary | ICD-10-CM

## 2012-12-12 DIAGNOSIS — N951 Menopausal and female climacteric states: Secondary | ICD-10-CM

## 2012-12-12 DIAGNOSIS — R209 Unspecified disturbances of skin sensation: Secondary | ICD-10-CM

## 2012-12-12 DIAGNOSIS — R232 Flushing: Secondary | ICD-10-CM

## 2012-12-12 DIAGNOSIS — Z0000 Encounter for general adult medical examination without abnormal findings: Secondary | ICD-10-CM | POA: Insufficient documentation

## 2012-12-12 DIAGNOSIS — M549 Dorsalgia, unspecified: Secondary | ICD-10-CM

## 2012-12-12 DIAGNOSIS — E785 Hyperlipidemia, unspecified: Secondary | ICD-10-CM | POA: Diagnosis not present

## 2012-12-12 MED ORDER — IBUPROFEN 800 MG PO TABS: 800.0000 mg | ORAL_TABLET | Freq: Three times a day (TID) | ORAL | Status: DC | PRN

## 2012-12-12 MED ORDER — SIMVASTATIN 10 MG PO TABS: 10.0000 mg | ORAL_TABLET | Freq: Every day | ORAL | Status: DC

## 2012-12-12 MED ORDER — TRAMADOL HCL 50 MG PO TABS: 50.0000 mg | ORAL_TABLET | Freq: Four times a day (QID) | ORAL | Status: DC | PRN

## 2012-12-12 MED ORDER — GABAPENTIN 300 MG PO CAPS: 300.0000 mg | ORAL_CAPSULE | Freq: Every day | ORAL | Status: DC

## 2012-12-12 NOTE — Assessment & Plan Note (Signed)
Stable on Gabapentin.

## 2012-12-12 NOTE — Patient Instructions (Signed)
It was nice to see you again Teresa [name].   For your back pain, I have prescribed Tramadol.  Take as directed.  It will also help to take before bedtime to help with the discomfort that has been preventing a restful sleep. You may also take the ibuprofen along with the Tramadol to help with the back pain. We will check your cholesterol and other blood tests today.  If anything is abnormal, we will contact you. If you change your mind about the flu shot, contact the clinic. I will like to see you in another year, unless you need to be seen earlier. Have a Happy 62th birthday! I hope that you get the party that you have been wishing for over this past year.

## 2012-12-12 NOTE — Assessment & Plan Note (Signed)
Denies

## 2012-12-12 NOTE — Progress Notes (Signed)
Mammogram was ordered at [facility] at [phone]  at [facility] in [place].  Pt is scheduled for 03/15/2013 at 11:40 AM.  Angelina Ok, RN 12/12/2012 3:49 PM.

## 2012-12-12 NOTE — Assessment & Plan Note (Signed)
Occasional, pt believes that she "has already gone through the change" ie menopause

## 2012-12-12 NOTE — Assessment & Plan Note (Signed)
Controlled with Benadryl Allergy OTC

## 2012-12-12 NOTE — Progress Notes (Signed)
  Subjective:    Patient ID: Teresa [name], female    DOB: July 10, 1951, 62 y.o.   MRN: [id number]  HPI Pt with hx significant for chronic back pain s/p lumbar laminectomy, paresthesia of lower extremities, hyperlipidemia, and allergic rhinitis Presents for yearly f/u. Pt states that the ibuprofen no longer alleviates her back pain which is preventing her from having a restful sleep.  She uses Benadryl allergy as needed for her rhinitis which is sufficient.  Managed with Gabapentin for her leg numbness which has been persistent since lumbar laminectomy.  Denies chest pain, shortness of breath or leg swelling.  States that her feet and legs get achy after standing for hours at her job at [employer].  Otherwise she is without complaint and anticipating her 62th birthday.  Review of Systems As per HPI otherwise negative    Objective:   Physical Exam  Constitutional: She is oriented to person, place, and time. She appears well-developed and well-nourished.  Pleasant AA female  HENT:  Head: Normocephalic and atraumatic.  Eyes: Conjunctivae and EOM are normal. Pupils are equal, round, and reactive to light.  Neck: Normal range of motion. Neck supple. No thyromegaly present.  Cardiovascular: Normal rate, regular rhythm, normal heart sounds and intact distal pulses.   Pulmonary/Chest: Effort normal and breath sounds normal. No respiratory distress. She has no wheezes. She has no rales.  Abdominal: Soft. Bowel sounds are normal. There is no tenderness.  Musculoskeletal: Normal range of motion.  Neurological: She is alert and oriented to person, place, and time.  Skin: Skin is warm and dry.  Psychiatric: She has a normal mood and affect.          Assessment & Plan:  1. Chronic back pain: start Tramadol 50-100 mg q6h prn 2. Hyperlipidemia: cont Zocor 10 mg; check lipid panel today 3.Allergic rhinitis: controlled with Benadryl allergy OTC 4. Leg numbness: stable on Gabapentin, no  recent falls, check CBC today 5. Preventative Health Care -coloconoscopy due 2018 -Mammogram to be schedlued at [facility] in [place] -declines Flu shot -declines Pap Smear 6. Dispo: return in 1 year

## 2012-12-12 NOTE — Assessment & Plan Note (Addendum)
-  Scheduled mammogram at [facility] in [place] per pt request. -pt declines flu shot -colonoscopy due 2018 with normal colonoscopy 2008 per pt report -declines pap smear (feels that she has already "gone through the change")

## 2012-12-12 NOTE — Assessment & Plan Note (Signed)
Controlled on Zocor 10 mg qd; Will check lipid panel today  Lipid Panel     Component Value Date/Time   CHOL 184 12/09/2011 1559   TRIG 69 12/09/2011 1559   HDL 60 12/09/2011 1559   CHOLHDL 3.1 12/09/2011 1559   VLDL 14 12/09/2011 1559   LDLCALC 110* 12/09/2011 1559

## 2012-12-12 NOTE — Assessment & Plan Note (Signed)
S/p lumbar laminectomy in early  2000s.  Pt reporst ibuprofen no longer controlling her discomfort and now preventing restful sleep. -will start Tramadol 50 -100 mg q6hr prn.  Advised to take before work (works at [employer]) and before bedtime.

## 2012-12-13 LAB — CBC
HCT: 39 % (ref 36.0–46.0)
MCHC: 32.8 g/dL (ref 30.0–36.0)
MCV: 79.4 fL (ref 78.0–100.0)
Platelets: 270 10*3/uL (ref 150–400)
RBC: 4.91 MIL/uL (ref 3.87–5.11)
RDW: 14.1 % (ref 11.5–15.5)
WBC: 6.8 10*3/uL (ref 4.0–10.5)

## 2012-12-13 LAB — LIPID PANEL
Cholesterol: 176 mg/dL (ref 0–200)
HDL: 56 mg/dL (ref >39)
LDL Cholesterol: 107 mg/dL — ABNORMAL HIGH (ref 0–99)
Total CHOL/HDL Ratio: 3.1 Ratio

## 2012-12-21 ENCOUNTER — Other Ambulatory Visit: Payer: Self-pay | Admitting: Internal Medicine

## 2013-02-13 ENCOUNTER — Other Ambulatory Visit: Payer: Self-pay | Admitting: *Deleted

## 2013-02-13 ENCOUNTER — Encounter: Payer: Self-pay | Admitting: *Deleted

## 2013-02-13 DIAGNOSIS — E785 Hyperlipidemia, unspecified: Secondary | ICD-10-CM

## 2013-02-13 DIAGNOSIS — R202 Paresthesia of skin: Secondary | ICD-10-CM

## 2013-02-13 DIAGNOSIS — R200 Anesthesia of skin: Secondary | ICD-10-CM

## 2013-02-13 DIAGNOSIS — M549 Dorsalgia, unspecified: Secondary | ICD-10-CM

## 2013-02-14 MED ORDER — SIMVASTATIN 10 MG PO TABS: 10.0000 mg | ORAL_TABLET | Freq: Every day | ORAL | Status: DC

## 2013-02-14 MED ORDER — GABAPENTIN 300 MG PO CAPS: 300.0000 mg | ORAL_CAPSULE | Freq: Every day | ORAL | Status: DC

## 2013-02-14 MED ORDER — IBUPROFEN 800 MG PO TABS: 800.0000 mg | ORAL_TABLET | Freq: Three times a day (TID) | ORAL | Status: DC | PRN

## 2013-03-15 ENCOUNTER — Ambulatory Visit: Payer: Self-pay

## 2013-03-15 DIAGNOSIS — Z1231 Encounter for screening mammogram for malignant neoplasm of breast: Secondary | ICD-10-CM | POA: Diagnosis not present

## 2013-08-15 ENCOUNTER — Other Ambulatory Visit: Payer: Self-pay | Admitting: *Deleted

## 2013-08-15 DIAGNOSIS — M549 Dorsalgia, unspecified: Secondary | ICD-10-CM

## 2013-08-21 MED ORDER — TRAMADOL HCL 50 MG PO TABS: 50.0000 mg | ORAL_TABLET | Freq: Four times a day (QID) | ORAL | Status: DC | PRN

## 2013-08-21 NOTE — Telephone Encounter (Addendum)
Tramadol rx faxed to [facility].

## 2014-01-11 ENCOUNTER — Encounter: Payer: Self-pay | Admitting: Internal Medicine

## 2014-01-22 ENCOUNTER — Ambulatory Visit (INDEPENDENT_AMBULATORY_CARE_PROVIDER_SITE_OTHER): Payer: Medicare Other | Admitting: Internal Medicine

## 2014-01-22 ENCOUNTER — Encounter: Payer: Self-pay | Admitting: Internal Medicine

## 2014-01-22 VITALS — BP 130/89 | HR 78 | Temp 97.2°F | Ht 65.0 in | Wt 191.0 lb

## 2014-01-22 DIAGNOSIS — E785 Hyperlipidemia, unspecified: Secondary | ICD-10-CM

## 2014-01-22 DIAGNOSIS — M549 Dorsalgia, unspecified: Secondary | ICD-10-CM | POA: Diagnosis not present

## 2014-01-22 DIAGNOSIS — Z1239 Encounter for other screening for malignant neoplasm of breast: Secondary | ICD-10-CM | POA: Diagnosis not present

## 2014-01-22 DIAGNOSIS — J309 Allergic rhinitis, unspecified: Secondary | ICD-10-CM

## 2014-01-22 DIAGNOSIS — Z0000 Encounter for general adult medical examination without abnormal findings: Secondary | ICD-10-CM

## 2014-01-22 LAB — LIPID PANEL
CHOL/HDL RATIO: 2.9 ratio
Cholesterol: 174 mg/dL (ref 0–200)
HDL: 60 mg/dL (ref >39)
LDL CALC: 99 mg/dL (ref 0–99)
Triglycerides: 75 mg/dL (ref <150)
VLDL: 15 mg/dL (ref 0–40)

## 2014-01-22 MED ORDER — TRAMADOL HCL 50 MG PO TABS: 50.0000 mg | ORAL_TABLET | Freq: Four times a day (QID) | ORAL | Status: DC | PRN

## 2014-01-22 MED ORDER — CHLORPHENIRAMINE MALEATE 4 MG PO TABS: 4.0000 mg | ORAL_TABLET | Freq: Two times a day (BID) | ORAL | Status: DC | PRN

## 2014-01-22 MED ORDER — CETIRIZINE HCL 10 MG PO TABS: 10.0000 mg | ORAL_TABLET | Freq: Every day | ORAL | Status: DC

## 2014-01-22 NOTE — Patient Instructions (Addendum)
We have refilled your Tramadol and prescribed medications for your allergies. We will schedule get you scheduled for a mammogram in June 2015.  They will call you to [facility] out a date. Thank you for bringing your medicines today. This helps [place] keep you safe from mistakes.   Allergic Rhinitis Allergic rhinitis is when the mucous membranes in the nose respond to allergens. Allergens are particles in the air that cause your body to have an allergic reaction. This causes you to release allergic antibodies. Through a chain of events, these eventually cause you to release histamine into the blood stream. Although meant to protect the body, it is this release of histamine that causes your discomfort, such as frequent sneezing, congestion, and an itchy, runny nose.  CAUSES  Seasonal allergic rhinitis (hay fever) is caused by pollen allergens that may come from grasses, trees, and weeds. Year-round allergic rhinitis (perennial allergic rhinitis) is caused by allergens such as house dust mites, pet dander, and mold spores.  SYMPTOMS   Nasal stuffiness (congestion).  Itchy, runny nose with sneezing and tearing of the eyes. DIAGNOSIS  Your health care provider can help you determine the allergen or allergens that trigger your symptoms. If you and your health care provider are unable to determine the allergen, skin or blood testing may be used. TREATMENT  Allergic Rhinitis does not have a cure, but it can be controlled by:  Medicines and allergy shots (immunotherapy).  Avoiding the allergen. Hay fever may often be treated with antihistamines in pill or nasal spray forms. Antihistamines block the effects of histamine. There are over-the-counter medicines that may help with nasal congestion and swelling around the eyes. Check with your health care provider before taking or giving this medicine.  If avoiding the allergen or the medicine prescribed do not work, there are many new medicines your health care  provider can prescribe. Stronger medicine may be used if initial measures are ineffective. Desensitizing injections can be used if medicine and avoidance does not work. Desensitization is when a patient is given ongoing shots until the body becomes less sensitive to the allergen. Make sure you follow up with your health care provider if problems continue. HOME CARE INSTRUCTIONS It is not possible to completely avoid allergens, but you can reduce your symptoms by taking steps to limit your exposure to them. It helps to know exactly what you are allergic to so that you can avoid your specific triggers. SEEK MEDICAL CARE IF:   You have a fever.  You develop a cough that does not stop easily (persistent).  You have shortness of breath.  You start wheezing.  Symptoms interfere with normal daily activities. Document Released: 07/07/2001 Document Revised: 08/02/2013 Document Reviewed: 06/19/2013 [facility] Patient Information 2014 [place], [place].  Chlorpheniramine tablets What is this medicine? CHLORPHENIRAMINE (klor fen IR a meen) is an antihistamine. It is used to treat a runny nose from allergies or a cold. It is also used to treat the symptoms an allergic reaction. This medicine will not treat an infection. This medicine may be used for other purposes; ask your health care provider or pharmacist if you have questions. COMMON BRAND NAME(S): AHIST, Aller-Chlor , Allergy , Chlor-Pheniton, Chlor-Trimeton, Diabetic Tussin Allergy Relief, ED-Chlortan, Teldrin HBP What should I tell my health care provider before I take this medicine? They need to know if you have any of these conditions: -glaucoma -heart disease -high blood pressure -lung or breathing disease, like asthma -pain or difficulty passing urine -prostate trouble -ulcers or  other stomach problems -an unusual or allergic reaction to chlorpheniramine, other medicines, foods, dyes, or preservatives -pregnant or trying to get  pregnant -breast-feeding How should I use this medicine? Take this medicine by mouth with a full glass of water. Follow the directions on the prescription label. Take your doses at regular intervals. Do not take your medicine more often than directed. Talk to your pediatrician regarding the use of this medicine in children. While this drug may be prescribed for selected conditions, precautions do apply. Patients over 22 years old may have a stronger reaction and need a smaller dose. Overdosage: If you think you have taken too much of this medicine contact a poison control center or emergency room at once. NOTE: This medicine is only for you. Do not share this medicine with others. What if I miss a dose? If you miss a dose, take it as soon as you can. If it is almost time for your next dose, take only that dose. Do not take double or extra doses. What may interact with this medicine? -alcohol -barbiturate medicines for sleep or treating seizures -MAOIs like Carbex, Eldepryl, Marplan, Nardil, and Parnate -medicines for allergies -medicines for depression, anxiety, or psychotic disturbances -medicines for sleep -some antibiotics This list may not describe all possible interactions. Give your health care provider a list of all the medicines, herbs, non-prescription drugs, or dietary supplements you use. Also tell them if you smoke, drink alcohol, or use illegal drugs. Some items may interact with your medicine. What should I watch for while using this medicine? Visit your doctor or health care professional for regular check ups. Tell your doctor if your symptoms do not improve or if they get worse. Your mouth may get dry. Chewing sugarless gum or sucking hard candy, and drinking plenty of water may help. Contact your doctor if the problem does not go away or is severe. This medicine may cause dry eyes and blurred vision. If you wear contact lenses you may feel some discomfort. Lubricating drops may  help. See your eye doctor if the problem does not go away or is severe. You may get drowsy or dizzy. Do not drive, use machinery, or do anything that needs mental alertness until you know how this medicine affects you. Do not stand or sit up quickly, especially if you are an older patient. This reduces the risk of dizzy or fainting spells. Alcohol may interfere with the effect of this medicine. Avoid alcoholic drinks. This medicine can make you more sensitive to the sun. Keep out of the sun. If you cannot avoid being in the sun, wear protective clothing and use sunscreen. Do not use sun lamps or tanning beds/booths. What side effects may I notice from receiving this medicine? Side effects that you should report to your doctor or health care professional as soon as possible: -allergic reactions like skin rash, itching or hives, swelling of the face, lips, or tongue -breathing problems -changes in vision -confused, agitated, nervous -fast, irregular heartbeat -feeling faint, dizzy -seizures -tremor -trouble passing urine or change in the amount of urine -unusual sweating -unusually weak or tired Side effects that usually do not require medical attention (report to your doctor or health care professional if they continue or are bothersome): -constipation or diarrhea -drowsy -dry mouth, nose, throat -headache -loss of appetite -stomach upset, vomiting -trouble sleeping This list may not describe all possible side effects. Call your doctor for medical advice about side effects. You may report side effects to FDA  at 1-800-FDA-1088. Where should I keep my medicine? Keep out of the reach of children. Store at room temperature between 15 and 30 degrees C (59 and 86 degrees F). Keep container tightly closed. Throw away any unused medicine after the expiration date. NOTE: This sheet is a summary. It may not cover all possible information. If you have questions about this medicine, talk to your  doctor, pharmacist, or health care provider.  2014, Elsevier/Gold Standard. (2008-01-25 17:37:35)  Cetirizine tablets What is this medicine? CETIRIZINE (se TI ra zeen) is an antihistamine. This medicine is used to treat or prevent symptoms of allergies. It is also used to help reduce itchy skin rash and hives. This medicine may be used for other purposes; ask your health care provider or pharmacist if you have questions. COMMON BRAND NAME(S): All Day Allergy , Zyrtec Hives Relief , Zyrtec What should I tell my health care provider before I take this medicine? They need to know if you have any of these conditions: -kidney disease -liver disease -an unusual or allergic reaction to cetirizine, hydroxyzine, other medicines, foods, dyes, or preservatives -pregnant or trying to get pregnant -breast-feeding How should I use this medicine? Take this medicine by mouth with a glass of water. Follow the directions on the prescription label. You can take this medicine with food or on an empty stomach. Take your medicine at regular times. Do not take more often than directed. You may need to take this medicine for several days before your symptoms improve. Talk to your pediatrician regarding the use of this medicine in children. Special care may be needed. While this drug may be prescribed for children as young as 626 years of age for selected conditions, precautions do apply. Overdosage: If you think you have taken too much of this medicine contact a poison control center or emergency room at once. NOTE: This medicine is only for you. Do not share this medicine with others. What if I miss a dose? If you miss a dose, take it as soon as you can. If it is almost time for your next dose, take only that dose. Do not take double or extra doses. What may interact with this medicine? -other medicines for colds or allergies -theophylline This list may not describe all possible interactions. Give your health care  provider a list of all the medicines, herbs, non-prescription drugs, or dietary supplements you use. Also tell them if you smoke, drink alcohol, or use illegal drugs. Some items may interact with your medicine. What should I watch for while using this medicine? Visit your doctor or health care professional for regular checks on your health. Tell your doctor if your symptoms do not improve. You may get drowsy or dizzy. Do not drive, use machinery, or do anything that needs mental alertness until you know how this medicine affects you. Do not stand or sit up quickly, especially if you are an older patient. This reduces the risk of dizzy or fainting spells. Your mouth may get dry. Chewing sugarless gum or sucking hard candy, and drinking plenty of water may help. Contact your doctor if the problem does not go away or is severe. What side effects may I notice from receiving this medicine? Side effects that you should report to your doctor or health care professional as soon as possible: -allergic reactions like skin rash, itching or hives, swelling of the face, lips, or tongue -changes in vision or hearing -fast heartbeat -high blood pressure -infection -trouble passing urine or  change in the amount of urine Side effects that usually do not require medical attention (report to your doctor or health care professional if they continue or are bothersome): -irritability -loss of sleep -sore throat -stomach pain -swelling This list may not describe all possible side effects. Call your doctor for medical advice about side effects. You may report side effects to FDA at 1-800-FDA-1088. Where should I keep my medicine? Keep out of the reach of children. Store at room temperature between 15 and 30 degrees C (59 and 86 degrees F). Throw away any unused medicine after the expiration date. NOTE: This sheet is a summary. It may not cover all possible information. If you have questions about this medicine, talk  to your doctor, pharmacist, or health care provider.  2014, Elsevier/Gold Standard. (2007-12-19 18:28:02)

## 2014-01-22 NOTE — Assessment & Plan Note (Signed)
Mammo for June 2014 Colonoscopy due 2018 Declines vaccination including flu and tDap

## 2014-01-22 NOTE — Assessment & Plan Note (Signed)
Zyrtec and chlorpheniramine added to regimen of OTC nasacort and saline spray

## 2014-01-22 NOTE — Progress Notes (Signed)
   Subjective:    Patient ID: Teresa [name], female    DOB: Jun 10, 1951, 63 y.o.   MRN: [id number]  HPI  63 yo presents today for general follow-up.  Hx is significant for hyperlipidemia, allergic rhinitis, and chronic back pain with leg numbness s/p lumbar laminectomy.  She reports doing will with complaints of "allergies" starting to bother her including runny, stuffy nose, and watery eyes especially if she is outdoors.  States that she has been trying to eat healthy and lately has been making home made smoothies.  She plans to get a stationary inclined bike to lose 10 pounds over the next few months.  Review of Systems  Constitutional: Negative.   HENT: Positive for postnasal drip, rhinorrhea and sinus pressure.   Eyes: Negative for visual disturbance.  Respiratory: Negative for shortness of breath.   Cardiovascular: Negative for chest pain.  Gastrointestinal: Negative.   Genitourinary: Negative.   Skin: Negative.   Neurological: Negative.   Psychiatric/Behavioral: Negative.        Objective:   Physical Exam  Constitutional: She is oriented to person, place, and time. She appears well-developed and well-nourished. No distress.  HENT:  Head: Normocephalic and atraumatic.  Right Ear: External ear normal.  Left Ear: External ear normal.  Nose: Mucosal edema and rhinorrhea present. Right sinus exhibits no maxillary sinus tenderness and no frontal sinus tenderness. Left sinus exhibits no maxillary sinus tenderness and no frontal sinus tenderness.  Mouth/Throat: Oropharynx is clear and moist.  Eyes: Conjunctivae and EOM are normal. Pupils are equal, round, and reactive to light.  Neck: Neck supple. No thyromegaly present.  Cardiovascular: Normal rate, regular rhythm, normal heart sounds and intact distal pulses.   Pulmonary/Chest: Effort normal and breath sounds normal.  Abdominal: Soft. Bowel sounds are normal.  Musculoskeletal: Normal range of motion. She exhibits no edema.    Neurological: She is alert and oriented to person, place, and time.  Skin: Skin is warm and dry.  Psychiatric: She has a normal mood and affect.          Assessment & Plan:  See separate problem-list charting for detailed A/P.

## 2014-01-22 NOTE — Assessment & Plan Note (Signed)
Tramadol refilled for intermittent back pain.

## 2014-01-25 NOTE — Progress Notes (Signed)
Case discussed with Dr. Schooler soon after the resident saw the patient.  We reviewed the resident's history and exam and pertinent patient test results.  I agree with the assessment, diagnosis, and plan of care documented in the resident's note. 

## 2014-02-27 ENCOUNTER — Other Ambulatory Visit: Payer: Self-pay | Admitting: Internal Medicine

## 2014-03-08 ENCOUNTER — Other Ambulatory Visit: Payer: Self-pay | Admitting: *Deleted

## 2014-03-08 DIAGNOSIS — M549 Dorsalgia, unspecified: Secondary | ICD-10-CM

## 2014-03-08 DIAGNOSIS — R200 Anesthesia of skin: Secondary | ICD-10-CM

## 2014-03-08 DIAGNOSIS — J309 Allergic rhinitis, unspecified: Secondary | ICD-10-CM

## 2014-03-08 DIAGNOSIS — R202 Paresthesia of skin: Secondary | ICD-10-CM

## 2014-03-08 MED ORDER — GABAPENTIN 300 MG PO CAPS: 300.0000 mg | ORAL_CAPSULE | Freq: Every day | ORAL | Status: DC

## 2014-03-08 MED ORDER — CHLORPHENIRAMINE MALEATE 4 MG PO TABS: 4.0000 mg | ORAL_TABLET | Freq: Two times a day (BID) | ORAL | Status: DC | PRN

## 2014-03-08 MED ORDER — TRAMADOL HCL 50 MG PO TABS: 50.0000 mg | ORAL_TABLET | Freq: Four times a day (QID) | ORAL | Status: DC | PRN

## 2014-03-09 NOTE — Telephone Encounter (Signed)
Tramadol faxed.

## 2014-03-20 ENCOUNTER — Other Ambulatory Visit: Payer: Self-pay | Admitting: *Deleted

## 2014-03-20 DIAGNOSIS — M549 Dorsalgia, unspecified: Secondary | ICD-10-CM

## 2014-03-21 MED ORDER — IBUPROFEN 800 MG PO TABS: 800.0000 mg | ORAL_TABLET | Freq: Three times a day (TID) | ORAL | Status: DC | PRN

## 2014-04-02 ENCOUNTER — Ambulatory Visit (INDEPENDENT_AMBULATORY_CARE_PROVIDER_SITE_OTHER): Payer: Medicare Other | Admitting: Internal Medicine

## 2014-04-02 ENCOUNTER — Encounter: Payer: Self-pay | Admitting: Internal Medicine

## 2014-04-02 VITALS — BP 122/77 | HR 88 | Temp 98.3°F | Ht 64.0 in | Wt 194.6 lb

## 2014-04-02 DIAGNOSIS — J309 Allergic rhinitis, unspecified: Secondary | ICD-10-CM | POA: Diagnosis not present

## 2014-04-02 DIAGNOSIS — Z0000 Encounter for general adult medical examination without abnormal findings: Secondary | ICD-10-CM | POA: Diagnosis not present

## 2014-04-02 DIAGNOSIS — Z1239 Encounter for other screening for malignant neoplasm of breast: Secondary | ICD-10-CM | POA: Diagnosis not present

## 2014-04-02 DIAGNOSIS — N3000 Acute cystitis without hematuria: Secondary | ICD-10-CM | POA: Diagnosis not present

## 2014-04-02 DIAGNOSIS — E785 Hyperlipidemia, unspecified: Secondary | ICD-10-CM | POA: Diagnosis not present

## 2014-04-02 DIAGNOSIS — R319 Hematuria, unspecified: Secondary | ICD-10-CM | POA: Diagnosis not present

## 2014-04-02 DIAGNOSIS — M549 Dorsalgia, unspecified: Secondary | ICD-10-CM | POA: Diagnosis not present

## 2014-04-02 HISTORY — DX: Acute cystitis without hematuria: N30.00

## 2014-04-02 LAB — POCT URINALYSIS DIPSTICK
Bilirubin, UA: NEGATIVE
GLUCOSE UA: NEGATIVE
Ketones, UA: NEGATIVE
NITRITE UA: NEGATIVE
UROBILINOGEN UA: 0.2
pH, UA: 5.5

## 2014-04-02 MED ORDER — SULFAMETHOXAZOLE-TRIMETHOPRIM 400-80 MG PO TABS: 1.0000 | ORAL_TABLET | Freq: Two times a day (BID) | ORAL | Status: AC

## 2014-04-02 NOTE — Patient Instructions (Signed)
General Instructions:   Please try to bring all your medicines next time. This will help [place] keep you safe from mistakes.   Progress Toward Treatment Goals:  No flowsheet data found.  Self Care Goals & Plans:  No flowsheet data found.  No flowsheet data found.   Care Management & Community Referrals:  No flowsheet data found.     Urinary Tract Infection A urinary tract infection (UTI) can occur any place along the urinary tract. The tract includes the kidneys, ureters, bladder, and urethra. A type of germ called bacteria often causes a UTI. UTIs are often helped with antibiotic medicine.  HOME CARE   If given, take antibiotics as told by your doctor. Finish them even if you start to feel better.  Drink enough fluids to keep your pee (urine) clear or pale yellow.  Avoid tea, drinks with caffeine, and bubbly (carbonated) drinks.  Pee often. Avoid holding your pee in for a long time.  Pee before and after having sex (intercourse).  Wipe from front to back after you poop (bowel movement) if you are a woman. Use each tissue only once. GET HELP RIGHT AWAY IF:   You have back pain.  You have lower belly (abdominal) pain.  You have chills.  You feel sick to your stomach (nauseous).  You throw up (vomit).  Your burning or discomfort with peeing does not go away.  You have a fever.  Your symptoms are not better in 3 days. MAKE SURE YOU:   Understand these instructions.  Will watch your condition.  Will get help right away if you are not doing well or get worse. Document Released: 03/30/2008 Document Revised: 07/06/2012 Document Reviewed: 05/12/2012 [facility] Patient Information 2014 [place], [place].

## 2014-04-02 NOTE — Progress Notes (Signed)
Subjective:    Patient ID: Teresa [name], female    DOB: [birth year], 63 y.o.   MRN: [id number]  HPI Ms. Kopitzke is a 63 yo woman pmh as listed below presents for "blood in urine."  Pt states that she has noticed darkness in her urine for the past 1-2 days. Other symptoms include some intermittent very brief suprapubic pelvic pain that was relieved after micturition. She has also noticed some increased urinary frequency going anywhere between 8-10 times per day which is abnormal for the patient. She denies any new sexual partners, increased sexual activity, new douches, or new vaginal creams. She has not noticed any frank blood or new vaginal discharge. She denied any fevers or chills or nausea/vomiting/diarrhea. She does have chronic back pain but nothing new. The patient is not a smoker but her husband is a smoker. The patient does not have a personal history or family history of kidney stones.  Using the bathroom relieves the symptoms and nothing really seems to exacerbates her symptoms. She has not had episodes like this in the past.   Past Medical History  Diagnosis Date  . Depression   . Hyperlipidemia   . Anxiety   . Allergic rhinitis    Current Outpatient Prescriptions on File Prior to Visit  Medication Sig Dispense Refill  . cetirizine (ZYRTEC) 10 MG tablet Take 1 tablet (10 mg total) by mouth daily.  30 tablet  3  . chlorpheniramine (CHLOR-TRIMETON) 4 MG tablet Take 1 tablet (4 mg total) by mouth 2 (two) times daily as needed for allergies.  14 tablet  0  . gabapentin (NEURONTIN) 300 MG capsule Take 1 capsule (300 mg total) by mouth daily.  90 capsule  4  . ibuprofen (ADVIL,MOTRIN) 800 MG tablet Take 1 tablet (800 mg total) by mouth every 8 (eight) hours as needed.  30 tablet  11  . simvastatin (ZOCOR) 10 MG tablet TAKE 1 TABLET BY MOUTH AT  BEDTIME  30 tablet  11  . traMADol (ULTRAM) 50 MG tablet Take 1-2 tablets (50-100 mg total) by mouth every 6 (six) hours as needed.  60  tablet  3   No current facility-administered medications on file prior to visit.   Social, surgical, family history reviewed with patient and updated in appropriate chart locations.   Review of Systems  History obtained from chart review and the patient General ROS: negative for - chills or fever Respiratory ROS: no cough, shortness of breath, or wheezing Cardiovascular ROS: no chest pain or dyspnea on exertion Gastrointestinal ROS: no abdominal pain, change in bowel habits, or black or bloody stools positive for - one episode of lower pelvic pain Genito-Urinary ROS: positive for - dysuria, hematuria, pelvic pain and urinary frequency/urgency negative for - change in urinary stream, dyspareunia, genital discharge, genital ulcers, incontinence, nocturia or vulvar/vaginal symptoms Neurological ROS: negative for - dizziness or headaches     Objective:   Physical Exam Filed Vitals:   04/02/14 1350  BP: 122/77  Pulse: 88  Temp: 98.3 F (36.8 C)   General: sitting in chair, NAD  HEENT: PERRL, EOMI, no scleral icterus Cardiac: RRR, no rubs, murmurs or gallops Pulm: clear to auscultation bilaterally, moving normal volumes of air Abd: soft, nontender, nondistended, BS present, no CVA tenderness, no vaginal discharge, vaginal dryness, no suprapubic tenderness Ext: warm and well perfused, no pedal edema Neuro: alert and oriented X3, cranial nerves II-XII grossly intact     Assessment & Plan:  Please see problem oriented  charting  Pt discussed with Dr. Meredith PelJoines

## 2014-04-02 NOTE — Assessment & Plan Note (Addendum)
Pt presents with "dark urine" she thought was blood and some suprapubic tenderness (not found on exam today) along with some blood and leuks in her urine in the setting of the patient increased frequency thought maybe UTI vs acute cystitis. Doesn't appear to be kidney stones w/o CVA tenderness or pyelo given lack of systemic findings such as fever. This is also less likely malignancy in the setting of no hx of smoking (although should be considered in setting of significant second hand smoke exposure) but pt is symptomatic therefore will treat for infection and then follow up if symptoms don't resolve. Another caveat would be if the micro doesn't show blood investigation into other causes should be pursued with further testing.  -Ua with reflex micro, UCx -Bactrim DS BID x 3 days -supportive management with oral fluids for hydration

## 2014-04-03 LAB — URINALYSIS, ROUTINE W REFLEX MICROSCOPIC
Bilirubin Urine: NEGATIVE
GLUCOSE, UA: NEGATIVE mg/dL
Nitrite: NEGATIVE
PROTEIN: 30 mg/dL — AB
Specific Gravity, Urine: 1.028 (ref 1.005–1.030)
UROBILINOGEN UA: 0.2 mg/dL (ref 0.0–1.0)
pH: 5.5 (ref 5.0–8.0)

## 2014-04-03 LAB — URINE CULTURE
COLONY COUNT: NO GROWTH
Organism ID, Bacteria: NO GROWTH

## 2014-04-03 LAB — URINALYSIS, MICROSCOPIC ONLY
CASTS: NONE SEEN
CRYSTALS: NONE SEEN

## 2014-04-03 NOTE — Progress Notes (Signed)
Case discussed with Dr. Sadek at the time of the visit.  We reviewed the resident's history and exam and pertinent patient test results.  I agree with the assessment, diagnosis, and plan of care documented in the resident's note. 

## 2014-04-19 ENCOUNTER — Ambulatory Visit: Payer: Self-pay | Admitting: Internal Medicine

## 2014-04-19 DIAGNOSIS — Z1231 Encounter for screening mammogram for malignant neoplasm of breast: Secondary | ICD-10-CM | POA: Diagnosis not present

## 2015-02-19 ENCOUNTER — Telehealth: Payer: Self-pay | Admitting: Internal Medicine

## 2015-02-19 NOTE — Telephone Encounter (Signed)
Call to patient to confirm appointment for 02/20/15 at 8:15 lmtcb

## 2015-02-20 ENCOUNTER — Ambulatory Visit (INDEPENDENT_AMBULATORY_CARE_PROVIDER_SITE_OTHER): Payer: Medicare Other | Admitting: Internal Medicine

## 2015-02-20 ENCOUNTER — Encounter: Payer: Self-pay | Admitting: Internal Medicine

## 2015-02-20 VITALS — BP 140/87 | HR 76 | Temp 97.9°F | Ht 64.0 in | Wt 195.4 lb

## 2015-02-20 DIAGNOSIS — M5416 Radiculopathy, lumbar region: Secondary | ICD-10-CM

## 2015-02-20 DIAGNOSIS — E785 Hyperlipidemia, unspecified: Secondary | ICD-10-CM

## 2015-02-20 DIAGNOSIS — J309 Allergic rhinitis, unspecified: Secondary | ICD-10-CM

## 2015-02-20 DIAGNOSIS — Z0000 Encounter for general adult medical examination without abnormal findings: Secondary | ICD-10-CM

## 2015-02-20 DIAGNOSIS — R748 Abnormal levels of other serum enzymes: Secondary | ICD-10-CM | POA: Insufficient documentation

## 2015-02-20 DIAGNOSIS — R519 Headache, unspecified: Secondary | ICD-10-CM | POA: Insufficient documentation

## 2015-02-20 DIAGNOSIS — R51 Headache: Secondary | ICD-10-CM | POA: Diagnosis not present

## 2015-02-20 HISTORY — DX: Radiculopathy, lumbar region: M54.16

## 2015-02-20 LAB — COMPLETE METABOLIC PANEL WITH GFR
ALBUMIN: 4.2 g/dL (ref 3.5–5.2)
ALT: 22 U/L (ref 0–35)
AST: 21 U/L (ref 0–37)
Alkaline Phosphatase: 56 U/L (ref 39–117)
BUN: 15 mg/dL (ref 6–23)
CALCIUM: 9.2 mg/dL (ref 8.4–10.5)
CHLORIDE: 104 meq/L (ref 96–112)
CO2: 26 meq/L (ref 19–32)
CREATININE: 0.69 mg/dL (ref 0.50–1.10)
GFR, Est Non African American: >89 mL/min
Glucose, Bld: 104 mg/dL — ABNORMAL HIGH (ref 70–99)
POTASSIUM: 3.9 meq/L (ref 3.5–5.3)
SODIUM: 140 meq/L (ref 135–145)
TOTAL PROTEIN: 6.5 g/dL (ref 6.0–8.3)
Total Bilirubin: 0.6 mg/dL (ref 0.2–1.2)

## 2015-02-20 LAB — CK TOTAL AND CKMB (NOT AT ARMC)
CK, MB: 3.2 ng/mL (ref 0.3–4.0)
Relative Index: 1.2 (ref 0.0–2.5)
Total CK: 267 U/L — ABNORMAL HIGH (ref 7–177)

## 2015-02-20 MED ORDER — GABAPENTIN 300 MG PO CAPS: 300.0000 mg | ORAL_CAPSULE | Freq: Every day | ORAL | Status: DC

## 2015-02-20 MED ORDER — SIMVASTATIN 10 MG PO TABS: ORAL_TABLET | ORAL | Status: DC

## 2015-02-20 MED ORDER — CETIRIZINE HCL 10 MG PO TABS: 10.0000 mg | ORAL_TABLET | Freq: Every day | ORAL | Status: DC

## 2015-02-20 MED ORDER — ACETAMINOPHEN 500 MG PO TABS: 500.0000 mg | ORAL_TABLET | ORAL | Status: DC | PRN

## 2015-02-20 MED ORDER — IBUPROFEN 800 MG PO TABS: 800.0000 mg | ORAL_TABLET | Freq: Three times a day (TID) | ORAL | Status: DC | PRN

## 2015-02-20 NOTE — Addendum Note (Signed)
Addended by: Bufford SpikesFULCHER, Caleb Prigmore N on: 02/20/2015 09:35 AM   Modules accepted: Orders

## 2015-02-20 NOTE — Progress Notes (Signed)
   Subjective:    Patient ID: Teresa LaymanMable T [name], female    DOB: 1950-11-17, 64 y.o.   MRN: [id number]  HPI Comments: 64 yo PMH lumbar laminectomy, HLD, GERD, chronic back pain (back), allergies   She presents for 1. Leg pain (entire post. Leg) 8/10 intermittent worse with cold weather, walking and standing and at times assoc with low back pain. She denies GU incontinence, saddle anesthesia, leg weakness currently.  She states since the winter time this year her posterior legs hurt so bad that she has to sit and rest and it "hurts" and feels like a "pulled muscle".  She tried her son in laws muscle relaxer with some relief last night   2. H/o recent h/a this week x 1 which was frontal, b/l temples w/o radiation.  She does drink caffeine and has a h/o allergies   3. Obesity-trying to exercise to lose wt  HM-will refer for mammogram      Review of Systems  Respiratory: Negative for shortness of breath.   Cardiovascular: Negative for chest pain and leg swelling.  Gastrointestinal: Negative for abdominal pain.  Musculoskeletal: Positive for back pain.       Objective:   Physical Exam  Constitutional: She is oriented to person, place, and time. Vital signs are normal. She appears well-developed and well-nourished. She is cooperative. No distress.  HENT:  Head: Normocephalic and atraumatic.  Eyes: Conjunctivae are normal. Pupils are equal, round, and reactive to light. Right eye exhibits no discharge. Left eye exhibits no discharge. No scleral icterus.  Cardiovascular: Normal rate, regular rhythm and normal heart sounds.   No murmur heard. Neg leg edema   Pulmonary/Chest: Effort normal and breath sounds normal. No respiratory distress. She has no wheezes.  Abdominal: Soft. Bowel sounds are normal. There is no tenderness.  Neurological: She is alert and oriented to person, place, and time. Gait normal.  CN 2-12 grossly intact   Skin: Skin is warm and dry. No rash noted. She is not  diaphoretic.  Psychiatric: She has a normal mood and affect. Her speech is normal and behavior is normal. Judgment and thought content normal. Cognition and memory are normal.  Nursing note and vitals reviewed.         Assessment & Plan:  F/u in 1-2 months prn otherwise establish with Dr. Bosie ClosSchooler 09/2015

## 2015-02-20 NOTE — Patient Instructions (Signed)
Please follow up in 1-2 months, sooner if needed  We will refer you for physical therapy and mammogram  Take care   Lumbosacral Radiculopathy Lumbosacral radiculopathy is a pinched nerve or nerves in the low back (lumbosacral area). When this happens you may have weakness in your legs and may not be able to stand on your toes. You may have pain going down into your legs. There may be difficulties with walking normally. There are many causes of this problem. Sometimes this may happen from an injury, or simply from arthritis or boney problems. It may also be caused by other illnesses such as diabetes. If there is no improvement after treatment, further studies may be done to find the exact cause. DIAGNOSIS  X-rays may be needed if the problems become long standing. Electromyograms may be done. This study is one in which the working of nerves and muscles is studied. HOME CARE INSTRUCTIONS   Applications of ice packs may be helpful. Ice can be used in a plastic bag with a towel around it to prevent frostbite to skin. This may be used every 2 hours for 20 to 30 minutes, or as needed, while awake, or as directed by your caregiver.  Only take over-the-counter or prescription medicines for pain, discomfort, or fever as directed by your caregiver.  If physical therapy was prescribed, follow your caregiver's directions. SEEK IMMEDIATE MEDICAL CARE IF:   You have pain not controlled with medications.  You seem to be getting worse rather than better.  You develop increasing weakness in your legs.  You develop loss of bowel or bladder control.  You have difficulty with walking or balance, or develop clumsiness in the use of your legs.  You have a fever. MAKE SURE YOU:   Understand these instructions.  Will watch your condition.  Will get help right away if you are not doing well or get worse. Document Released: 10/12/2005 Document Revised: 01/04/2012 Document Reviewed: 06/01/2008 [facility]  Patient Information 2015 [place], [place]. This information is not intended to replace advice given to you by your health care provider. Make sure you discuss any questions you have with your health care provider.

## 2015-02-20 NOTE — Assessment & Plan Note (Signed)
Rx refill Zyrtec  

## 2015-02-20 NOTE — Assessment & Plan Note (Signed)
Refer to mammogram

## 2015-02-20 NOTE — Assessment & Plan Note (Signed)
No alarm sx's could be allergic/tension Prn NSAID, Zyrtec RTC if not resolving

## 2015-02-20 NOTE — Progress Notes (Signed)
INTERNAL MEDICINE TEACHING ATTENDING ADDENDUM - Sulema Braid, MD: I reviewed and discussed at the time of visit with the resident Dr. McLean, the patient's medical history, physical examination, diagnosis and results of pertinent tests and treatment and I agree with the patient's care as documented.  

## 2015-02-20 NOTE — Addendum Note (Signed)
Addended by: Bufford SpikesFULCHER, Chelci [place] N on: 02/20/2015 09:45 AM   Modules accepted: Orders

## 2015-02-20 NOTE — Addendum Note (Signed)
Addended by: Bufford SpikesFULCHER, Lenzi Marmo N on: 02/20/2015 09:38 AM   Modules accepted: Orders

## 2015-02-20 NOTE — Assessment & Plan Note (Addendum)
No alarm sx's at this time. Likely lumbar radiculopathy less likely claudication b/c entire leg  Check CMET today. Also check CPK to see if leg pain could be related to statin use  Will try Tylenol alternating with Ibuprofen, Gabapentin qhs Will get PT outpatient

## 2015-02-21 ENCOUNTER — Encounter: Payer: Self-pay | Admitting: Internal Medicine

## 2015-03-05 ENCOUNTER — Encounter: Payer: Self-pay | Admitting: Physical Therapy

## 2015-03-05 ENCOUNTER — Ambulatory Visit: Payer: Medicare Other | Admitting: Physical Therapy

## 2015-03-05 ENCOUNTER — Encounter: Payer: Self-pay | Admitting: *Deleted

## 2015-03-05 DIAGNOSIS — M5416 Radiculopathy, lumbar region: Secondary | ICD-10-CM | POA: Diagnosis not present

## 2015-03-05 NOTE — Therapy (Signed)
[place] [facility] MAIN [facility] [address] [place], [place], [address] Phone: [phone]   Fax:  [phone]  Physical Therapy Evaluation  Patient Details  Name: Teresa [name] MRN: [id number] Date of Birth: 1951-04-07 Referring Provider:  Annett GulaMcLean, Tracy N, MD  Encounter Date: 03/05/2015      PT End of Session - 03/05/15 1203    Visit Number 1   Number of Visits 17   Date for PT Re-Evaluation 04/30/15   PT Start Time 1100   PT Stop Time 1204   PT Time Calculation (min) 64 min   Activity Tolerance Patient tolerated treatment well   Behavior During Therapy [facility] for tasks assessed/performed      Past Medical History  Diagnosis Date  . Depression   . Hyperlipidemia   . Anxiety   . Allergic rhinitis     Past Surgical History  Procedure Laterality Date  . Abdominal hysterectomy    . Tubal ligation    . Back surgery      2000    There were no vitals filed for this visit.  Visit Diagnosis:  Acute radicular low back pain - Plan: PT plan of care cert/re-cert, PT plan of care cert/re-cert      Subjective Assessment - 03/05/15 1110    Subjective Patient had back pain in 1999 and she is having pain in BLE buttocks to calf intermittent 5/10 to 9/10.   Currently in Pain? Yes   Pain Score 4    Pain Location Calf   Pain Orientation Right;Left   Pain Descriptors / Indicators Aching;Cramping   Pain Type Chronic pain   Pain Frequency Constant            [facility] PT Assessment - 03/05/15 0001    Assessment   Medical Diagnosis M54.16 (ICD-10-CM) - Lumbar radiculopathy   Onset Date 03/01/15   Prior Therapy no   Precautions   Precautions None   Required Braces or Orthoses Other Brace/Splint   Other Brace/Splint R AFO but no longer needs it   Balance Screen   Has the patient fallen in the past 6 months No   Has the patient had a decrease in activity level because of a fear of falling?  No   Is the patient reluctant to leave their  home because of a fear of falling?  No   Home Environment   Living Enviornment Private residence   Living Arrangements Spouse/significant other   Available Help at Discharge Family   Type of Home House   Home Access Stairs to enter   Entrance Stairs-Number of Steps 1   Entrance Stairs-Rails None   Home Layout One level   Home Equipment None   Observation/Other Assessments   Oswestry Disability Index  11%   Sensation   Light Touch Appears Intact  except right heel   Functional Tests   Functional tests Sit to Stand   Sit to Stand   Comments 15.04sec  for 5x   ROM / Strength   AROM / PROM / Strength Strength   Strength   Overall Strength Within functional limits for tasks performed   Overall Strength Comments except -3 with hip ext bilaterally   Palpation   Palpation L2-5 central with positive spring test   Special Tests    Special Tests Lumbar   FABER test   findings Positive   Side Right   Comment only   Prone Knee Bend Test   Findings Negative   Comment  bilaterally   Straight Leg Raise   Findings Negative   Comment bilaterally   Ambulation/Gait   Gait velocity 1.2755m/sec   6 Minute Walk- Baseline   6 Minute Walk- Baseline yes   Modified Borg Scale for Dyspnea --  123285ft   Timed Up and Go Test   Normal TUG (seconds) 10.02                   [facility] Adult PT Treatment/Exercise - 03/05/15 0001    Exercises   Exercises Lumbar   Lumbar Exercises: Stretches   Prone on Elbows Stretch 30 seconds   Press Ups 10 seconds  2 sets                PT Education - 03/05/15 1143    Education provided Yes   Education Details encouraged to return to walking program; prone pressups or standing trunk ext every hour.  Showed pt the spine model to explain exercise; lumbar roll   Person(s) Educated Patient   Methods Explanation;Demonstration   Comprehension Verbalized understanding;Verbal cues required;Returned demonstration          PT Short Term Goals -  03/05/15 1204    PT SHORT TERM GOAL #1   Title Pt independent in HEP for reducing back pain and radicular symptoms   Time 8   Period Weeks   Status New           PT Long Term Goals - 03/05/15 1237    PT LONG TERM GOAL #1   Title Patient will increase 10 meter walk test to >1.7557m/s as to improve gait speed for better community ambulation and to reduce fall risk.04/30/15   Status New   PT LONG TERM GOAL #2   Title Patient will be independent in home exercise program to improve strength/mobility for better functional independence with ADLs.04/30/15   Status New   PT LONG TERM GOAL #3   Title Patient will reduce modified Oswestry score to <20 as to demonstrate minimal disability with ADLs including improved sleeping tolerance, walking/sitting tolerance etc for better mobility with ADLs. 04/30/15   Baseline Patient will increase six minute walk test distance to >1500 for progression to community ambulator and improve gait ability 05/10/15   Status New               Plan - 03/05/15 1232    Clinical Impression Statement Patient is 64 year old female with constant LBP and BLE pain. She has weakness in BLE, + FABER RLE, decreased 5 x sit to stand, decreased 6 MW. Patient is not able to stand for longer than 1/2 hour secondary to pain. She will benefit from skilled PT to improve mobiity and decrease pain.    Pt will benefit from skilled therapeutic intervention in order to improve on the following deficits Decreased activity tolerance;Decreased endurance;Decreased safety awareness;Difficulty walking;Pain   Rehab Potential Good   PT Frequency 2x / week   PT Duration 4 weeks   PT Treatment/Interventions Moist Heat;Electrical Stimulation;Therapeutic exercise;Therapeutic activities;Patient/family education   PT Next Visit Plan assess benefits from prone press ups and if patient made a lumbar roll for home and car.   Consulted and Agree with Plan of Care Patient          G-Codes - 03/05/15  1255    Functional Assessment Tool Used 6mw odi   Functional Limitation Mobility: Walking and moving around   Mobility: Walking and Moving Around Current Status ([id number]) At least 60 percent but less  than 80 percent impaired, limited or restricted   Mobility: Walking and Moving Around Goal Status [phone]) At least 40 percent but less than 60 percent impaired, limited or restricted       Problem List Patient Active Problem List   Diagnosis Date Noted  . Lumbar radiculopathy 02/20/2015  . Headache 02/20/2015  . Elevated CPK 02/20/2015  . Cystitis, acute 04/02/2014  . Breast cancer screening 01/22/2014  . Preventative health care 12/12/2012  . Status post lumbar laminectomy 10/06/2012  . History of falling 10/06/2012  . HEAD TRAUMA, MILD 11/29/2008  . GERD 12/23/2006  . NUMBNESS, leg 12/23/2006  . Dyslipidemia 11/02/2006  . Allergic rhinitis 09/28/2006  . HOT FLASHES 09/28/2006  . BACK PAIN 09/28/2006   Thank you for this referral. Ezekiel Ina, PT, DPT  03/05/2015, 2:12 PM  [place] [facility] MAIN [facility] SERVICES [address] [place], [place], [address] Phone: [phone]   Fax:  [phone]

## 2015-03-05 NOTE — Patient Instructions (Signed)
Trunk: Prone Extension (Press-Ups)   Lie on stomach on firm, flat surface. Relax bottom and legs. Raise chest in air with elbows straight. Keep hips flat on surface, sag stomach. Hold ___1_ seconds. Repeat 10____ times. Do every hour during day. CAUTION: Movement should be gentle and slow.  Copyright  VHI. All rights reserved.  Trunk Extension   Standing, place back of open hands on low back. Straighten spine then arch the back and move shoulders back. Repeat __10__ times per session. Can do if bed not available Variation: Seated  Copyright  VHI. All rights reserved.

## 2015-03-07 ENCOUNTER — Other Ambulatory Visit: Payer: Self-pay | Admitting: *Deleted

## 2015-03-07 ENCOUNTER — Ambulatory Visit: Payer: Medicare Other | Attending: Internal Medicine | Admitting: Physical Therapy

## 2015-03-07 ENCOUNTER — Encounter: Payer: Self-pay | Admitting: Physical Therapy

## 2015-03-07 DIAGNOSIS — M5416 Radiculopathy, lumbar region: Secondary | ICD-10-CM | POA: Diagnosis not present

## 2015-03-07 DIAGNOSIS — J309 Allergic rhinitis, unspecified: Secondary | ICD-10-CM

## 2015-03-07 MED ORDER — CETIRIZINE HCL 10 MG PO TABS: 10.0000 mg | ORAL_TABLET | Freq: Every day | ORAL | Status: DC

## 2015-03-07 MED ORDER — ACETAMINOPHEN 500 MG PO TABS: 500.0000 mg | ORAL_TABLET | ORAL | Status: DC | PRN

## 2015-03-07 NOTE — Telephone Encounter (Signed)
Pt wants these meds sent to mail order.  Please resend

## 2015-03-07 NOTE — Therapy (Signed)
[place] [facility] [address] , [place], [address] Phone: [phone]   Fax:  [phone]  Physical Therapy Treatment  Patient Details  Name: Teresa [name] MRN: [id number] Date of Birth: October 19, 1951 Referring Provider:  Annett GulaMcLean, Tracy N, MD  Encounter Date: 03/07/2015      PT End of Session - 03/07/15 1322    Visit Number 2   Number of Visits 17   Date for PT Re-Evaluation 04/30/15   PT Start Time 1310   PT Stop Time 1348   PT Time Calculation (min) 38 min   Activity Tolerance Patient tolerated treatment well   Behavior During Therapy [facility] for tasks assessed/performed      Past Medical History  Diagnosis Date  . Depression   . Hyperlipidemia   . Anxiety   . Allergic rhinitis     Past Surgical History  Procedure Laterality Date  . Abdominal hysterectomy    . Tubal ligation    . Back surgery      2000    There were no vitals filed for this visit.  Visit Diagnosis:  Acute radicular low back pain      Subjective Assessment - 03/07/15 1321    Subjective Pateint is feeling much better with pain only in left posterior thigh and 3/10 and no pain in RLE.       manual therapy L1-L5 PA glides grade 2 and 3 x 30 bouts x 3 and 30 bouts with overpressure x 3 Patient progressed to theraeputic exericses for core strengtheing Therapeutic exericise  including TA contraction and hiooklying hip abd/ER with RTB x 20 x 3 sets, TA and hooklying marching x 20 x 3 sets, TA and heel slides x 20 x 3 BLE Patient educated in importance of HEP and walking.  Clinician recommended to continue her HEP of prone extension exercises every hour in prone or standing.  Educated in Estate manager/land agentbody mechanics for back pain.                            PT Education - 03/07/15 1322    Education provided Yes   Person(s) Educated Patient   Methods Demonstration;Explanation   Comprehension Verbalized understanding;Returned  demonstration;Verbal cues required          PT Short Term Goals - 03/05/15 1204    PT SHORT TERM GOAL #1   Title Pt independent in HEP for reducing back pain and radicular symptoms   Time 8   Period Weeks   Status New           PT Long Term Goals - 03/05/15 1237    PT LONG TERM GOAL #1   Title Patient will increase 10 meter walk test to >1.5766m/s as to improve gait speed for better community ambulation and to reduce fall risk.04/30/15   Status New   PT LONG TERM GOAL #2   Title Patient will be independent in home exercise program to improve strength/mobility for better functional independence with ADLs.04/30/15   Status New   PT LONG TERM GOAL #3   Title Patient will reduce modified Oswestry score to <20 as to demonstrate minimal disability with ADLs including improved sleeping tolerance, walking/sitting tolerance etc for better mobility with ADLs. 04/30/15   Baseline Patient will increase six minute walk test distance to >1500 for progression to community ambulator and improve gait ability 05/10/15   Status New  Plan - 03/07/15 1323    Clinical Impression Statement Patient is having less pain today and has been able to decrease her pain from 3/10 to 1/10 following manual therapy and exercise to core.         Problem List Patient Active Problem List   Diagnosis Date Noted  . Lumbar radiculopathy 02/20/2015  . Headache 02/20/2015  . Elevated CPK 02/20/2015  . Cystitis, acute 04/02/2014  . Breast cancer screening 01/22/2014  . Preventative health care 12/12/2012  . Status post lumbar laminectomy 10/06/2012  . History of falling 10/06/2012  . HEAD TRAUMA, MILD 11/29/2008  . GERD 12/23/2006  . NUMBNESS, leg 12/23/2006  . Dyslipidemia 11/02/2006  . Allergic rhinitis 09/28/2006  . HOT FLASHES 09/28/2006  . BACK PAIN 09/28/2006    Ezekiel InaMansfield, Tannon Peerson S 03/07/2015, 1:47 PM  [place] [facility] [address] [place], [place], [address] Phone: [phone]   Fax:  [phone]

## 2015-03-08 MED ORDER — ACETAMINOPHEN 500 MG PO TABS: 500.0000 mg | ORAL_TABLET | ORAL | Status: AC | PRN

## 2015-03-08 MED ORDER — CETIRIZINE HCL 10 MG PO TABS: 10.0000 mg | ORAL_TABLET | Freq: Every day | ORAL | Status: AC

## 2015-03-08 NOTE — Telephone Encounter (Signed)
These are for mail order.  You need to send electronically, please

## 2015-03-08 NOTE — Addendum Note (Signed)
Addended by: Harold BarbanNGO, Lalana Wachter on: 03/08/2015 07:05 PM   Modules accepted: Orders

## 2015-03-11 ENCOUNTER — Ambulatory Visit: Payer: Medicare Other | Admitting: Physical Therapy

## 2015-03-11 ENCOUNTER — Encounter: Payer: Self-pay | Admitting: Physical Therapy

## 2015-03-11 DIAGNOSIS — M5416 Radiculopathy, lumbar region: Secondary | ICD-10-CM | POA: Diagnosis not present

## 2015-03-11 NOTE — Therapy (Signed)
[place] [facility] [address] , [place], [address] Phone: [phone]   Fax:  [phone]  Physical Therapy Treatment  Patient Details  Name: Teresa [name] MRN: [id number] Date of Birth: 20-Jun-1951 Referring Provider:  Annett GulaMcLean, Tracy N, MD  Encounter Date: 03/11/2015      PT End of Session - 03/11/15 1146    Visit Number 3   Number of Visits 17   Date for PT Re-Evaluation 04/30/15   PT Start Time 1100   PT Stop Time 1138   PT Time Calculation (min) 38 min   Equipment Utilized During Treatment Other (comment)  theraband   Activity Tolerance Patient tolerated treatment well   Behavior During Therapy [facility] for tasks assessed/performed      Past Medical History  Diagnosis Date  . Depression   . Hyperlipidemia   . Anxiety   . Allergic rhinitis     Past Surgical History  Procedure Laterality Date  . Abdominal hysterectomy    . Tubal ligation    . Back surgery      2000    There were no vitals filed for this visit.  Visit Diagnosis:  Acute radicular low back pain      Subjective Assessment - 03/11/15 1146    Subjective Patient continues to improve with no pain today or numbness.       Patient progressed theraeputic exericses for core strengtheing Therapeutic exericise including TA contraction and hiooklying hip abd/ER with RTB x 20 x 3 sets, TA and hooklying marching x 20 x 3 sets, TA and heel slides x 20 x 3 BLE Standing core strengthening with rotation left and right with YTB x 20, supine bridging x 20 Patient educated in importance of HEP and walking.  Clinician recommended to continue her HEP of prone extension exercises every hour in prone or standing.  Educated in Estate manager/land agentbody mechanics for back pain.                           PT Education - 03/11/15 1146    Person([name]) Educated Patient          PT Short Term Goals - 03/05/15 1204    PT SHORT TERM GOAL #1   Title Pt  independent in HEP for reducing back pain and radicular symptoms   Time 8   Period Weeks   Status New           PT Long Term Goals - 03/05/15 1237    PT LONG TERM GOAL #1   Title Patient will increase 10 meter walk test to >1.5372m/[name] as to improve gait speed for better community ambulation and to reduce fall risk.04/30/15   Status New   PT LONG TERM GOAL #2   Title Patient will be independent in home exercise program to improve strength/mobility for better functional independence with ADLs.04/30/15   Status New   PT LONG TERM GOAL #3   Title Patient will reduce modified Oswestry score to <20 as to demonstrate minimal disability with ADLs including improved sleeping tolerance, walking/sitting tolerance etc for better mobility with ADLs. 04/30/15   Baseline Patient will increase six minute walk test distance to >1500 for progression to community ambulator and improve gait ability 05/10/15   Status New               Plan - 03/11/15 1147    Clinical Impression Statement Patient responds well to standing extension exercises.  She reports that her right shoulder is getting sore from prome extension exercises and clinician recommended to begin the exercises in standing position to allow right shoulder rest. Patient was instructed in 2 more exercises for HEP and has no increase in pain during therapy session.    Pt will benefit from skilled therapeutic intervention in order to improve on the following deficits Decreased activity tolerance;Decreased endurance;Decreased safety awareness;Difficulty walking;Pain   Rehab Potential Good   PT Frequency 2x / week   PT Duration 4 weeks   PT Treatment/Interventions Moist Heat;Electrical Stimulation;Therapeutic exercise;Therapeutic activities;Patient/family education   PT Next Visit Plan assess benefits from prone press ups and if patient made a lumbar roll for home and car.   Consulted and Agree with Plan of Care Patient        Problem List Patient  Active Problem List   Diagnosis Date Noted  . Lumbar radiculopathy 02/20/2015  . Headache 02/20/2015  . Elevated CPK 02/20/2015  . Cystitis, acute 04/02/2014  . Breast cancer screening 01/22/2014  . Preventative health care 12/12/2012  . Status post lumbar laminectomy 10/06/2012  . History of falling 10/06/2012  . HEAD TRAUMA, MILD 11/29/2008  . GERD 12/23/2006  . NUMBNESS, leg 12/23/2006  . Dyslipidemia 11/02/2006  . Allergic rhinitis 09/28/2006  . HOT FLASHES 09/28/2006  . BACK PAIN 09/28/2006  Ezekiel InaKristine [name] Etienne Mowers, PT, DPT  [name], Teresa [name] 03/11/2015, 11:53 AM   [facility] [address] [place], [place], [address] Phone: [phone]   Fax:  [phone]

## 2015-03-14 ENCOUNTER — Encounter: Payer: Self-pay | Admitting: Physical Therapy

## 2015-03-14 ENCOUNTER — Ambulatory Visit: Payer: Medicare Other | Admitting: Physical Therapy

## 2015-03-14 DIAGNOSIS — M5416 Radiculopathy, lumbar region: Secondary | ICD-10-CM | POA: Diagnosis not present

## 2015-03-14 NOTE — Therapy (Signed)
[place] [facility] MAIN [facility] SERVICES [address] [place], [place], [address] Phone: [phone]   Fax:  [phone]  Physical Therapy Treatment/dc summary  Patient Details  Name: SAHVANNA MCMANIGAL MRN: [id number] Date of Birth: [birth year] Referring Provider:  Annett Gula, MD  Encounter Date: 03/14/2015      PT End of Session - 03/14/15 1322    Visit Number 4   Number of Visits 17   Date for PT Re-Evaluation 04/30/15   PT Start Time 0115   PT Stop Time (p) 0153   PT Time Calculation (min) (p) 38 min   Activity Tolerance (p) Patient tolerated treatment well   Behavior During Therapy (p) WFL for tasks assessed/performed      Past Medical History  Diagnosis Date  . Depression   . Hyperlipidemia   . Anxiety   . Allergic rhinitis     Past Surgical History  Procedure Laterality Date  . Abdominal hysterectomy    . Tubal ligation    . Back surgery      2000    There were no vitals filed for this visit.  Visit Diagnosis:  Acute radicular low back pain      Subjective Assessment - 03/14/15 1321    Subjective Patient is having no pain today and she is not having to walk bent over due to pain.    Pain Score 0-No pain           ODI is 10% 10 MW = 1.20 5 x sit to stand 15.13 sec 6 MW test= 1350 feet TUG= 11 sec                      PT Education - 03/14/15 1322    Education provided Yes   Education Details Instruction in HEP   Person(s) Educated Patient   Methods Explanation   Comprehension Verbalized understanding;Returned demonstration          PT Short Term Goals - 03/05/15 1204    PT SHORT TERM GOAL #1   Title Pt independent in HEP for reducing back pain and radicular symptoms   Time 8   Period Weeks   Status New           PT Long Term Goals - 03/14/15 1326    PT LONG TERM GOAL #1   Title Patient will increase 10 meter walk test to >1.1m/s as to improve gait speed for better community  ambulation and to reduce fall risk.04/30/15   Status Achieved   PT LONG TERM GOAL #2   Title Patient will be independent in home exercise program to improve strength/mobility for better functional independence with ADLs.04/30/15   Status Achieved   PT LONG TERM GOAL #3   Title Patient will reduce modified Oswestry score to <20 as to demonstrate minimal disability with ADLs including improved sleeping tolerance, walking/sitting tolerance etc for better mobility with ADLs. 04/30/15   Status Achieved               Plan - 03/14/15 1323    Clinical Impression Statement Patient has responded well to therapeutic exercise  and is not having any pain in her back or symptoms down her LE's. Patietnt will be DC from PT to HEP.    Rehab Potential Good   PT Treatment/Interventions Moist Heat;Electrical Stimulation;Therapeutic exercise;Therapeutic activities;Patient/family education   PT Next Visit Plan DC from PT   Consulted and Agree with Plan of Care Patient  Problem List Patient Active Problem List   Diagnosis Date Noted  . Lumbar radiculopathy 02/20/2015  . Headache 02/20/2015  . Elevated CPK 02/20/2015  . Cystitis, acute 04/02/2014  . Breast cancer screening 01/22/2014  . Preventative health care 12/12/2012  . Status post lumbar laminectomy 10/06/2012  . History of falling 10/06/2012  . HEAD TRAUMA, MILD 11/29/2008  . GERD 12/23/2006  . NUMBNESS, leg 12/23/2006  . Dyslipidemia 11/02/2006  . Allergic rhinitis 09/28/2006  . HOT FLASHES 09/28/2006  . BACK PAIN 09/28/2006   PHYSICAL THERAPY DISCHARGE SUMMARY   Current functional level related to goals / functional outcomes: See abov  Remaining deficits:see above   Education / Equipment: See above Plan: Patient agrees to discharge.  Patient goals were met. Patient is being discharged due to meeting the stated rehab goals.  ?????       Alanson Puls, PT, DPT [place], [place] S 03/14/2015, 2:07 PM  [place] MAIN [facility] SERVICES [address] [place], [place], [address] Phone: [phone]   Fax:  [phone]

## 2015-03-18 ENCOUNTER — Ambulatory Visit: Payer: Medicare Other | Admitting: Physical Therapy

## 2015-04-04 ENCOUNTER — Encounter: Payer: Self-pay | Admitting: Physical Therapy

## 2015-04-04 NOTE — Therapy (Signed)
[place] [facility] REHAB SERVICES [address] [place], [place], [address] Phone: [phone]   Fax:  [phone]  Patient Details  Name: Teresa [name] MRN: [id number] Date of Birth: 12/23/1950 Referring Provider:  No ref. provider found  Encounter Date: 04/04/2015      PT Long Term Goals - 03/14/15 1326    PT LONG TERM GOAL #1   Title Patient will increase 10 meter walk test to >1.33m/s as to improve gait speed for better community ambulation and to reduce fall risk.04/30/15   Status Achieved   PT LONG TERM GOAL #2   Title Patient will be independent in home exercise program to improve strength/mobility for better functional independence with ADLs.04/30/15   Status Achieved   PT LONG TERM GOAL #3   Title Patient will reduce modified Oswestry score to <20 as to demonstrate minimal disability with ADLs including improved sleeping tolerance, walking/sitting tolerance etc for better mobility with ADLs. 04/30/15   Status Achieved     PHYSICAL THERAPY DISCHARGE SUMMARY  Visits from Start of Care: see above  Current functional level related to goals / functional outcomes: See above   Remaining deficits:core weakness   Education / Equipment: HEP Plan: Patient agrees to discharge.  Patient goals were not met. Patient is being discharged due to meeting the stated rehab goals.  ?????      Mansfield, Kristine S 04/04/2015, 5:26 PM  [place] [facility] REHAB SERVICES [address] [place], [place], [address] Phone: [phone]   Fax:  [phone] 

## 2015-04-22 ENCOUNTER — Ambulatory Visit
Admission: RE | Admit: 2015-04-22 | Discharge: 2015-04-22 | Disposition: A | Discharge status: Home / Self Care | Payer: Medicare Other | Source: Ambulatory Visit | Attending: Internal Medicine | Admitting: Internal Medicine

## 2015-04-22 ENCOUNTER — Other Ambulatory Visit: Payer: Self-pay | Admitting: Internal Medicine

## 2015-04-22 DIAGNOSIS — Z0000 Encounter for general adult medical examination without abnormal findings: Secondary | ICD-10-CM

## 2015-04-22 DIAGNOSIS — M545 Low back pain: Secondary | ICD-10-CM | POA: Diagnosis not present

## 2015-04-22 DIAGNOSIS — Z7189 Other specified counseling: Secondary | ICD-10-CM | POA: Diagnosis not present

## 2015-04-22 DIAGNOSIS — Z1231 Encounter for screening mammogram for malignant neoplasm of breast: Secondary | ICD-10-CM | POA: Diagnosis not present

## 2015-05-09 DIAGNOSIS — M5441 Lumbago with sciatica, right side: Secondary | ICD-10-CM | POA: Diagnosis not present

## 2015-05-09 DIAGNOSIS — M5442 Lumbago with sciatica, left side: Secondary | ICD-10-CM | POA: Diagnosis not present

## 2015-05-09 DIAGNOSIS — E780 Pure hypercholesterolemia: Secondary | ICD-10-CM | POA: Diagnosis not present

## 2015-05-09 DIAGNOSIS — Z79899 Other long term (current) drug therapy: Secondary | ICD-10-CM | POA: Diagnosis not present

## 2015-08-06 DIAGNOSIS — Z79899 Other long term (current) drug therapy: Secondary | ICD-10-CM | POA: Diagnosis not present

## 2015-08-06 DIAGNOSIS — E7800 Pure hypercholesterolemia, unspecified: Secondary | ICD-10-CM | POA: Diagnosis not present

## 2015-08-08 DIAGNOSIS — Z79899 Other long term (current) drug therapy: Secondary | ICD-10-CM | POA: Diagnosis not present

## 2015-08-08 DIAGNOSIS — E7800 Pure hypercholesterolemia, unspecified: Secondary | ICD-10-CM | POA: Diagnosis not present

## 2015-08-09 ENCOUNTER — Other Ambulatory Visit: Payer: Self-pay | Admitting: *Deleted

## 2015-08-09 DIAGNOSIS — E785 Hyperlipidemia, unspecified: Secondary | ICD-10-CM

## 2015-08-09 MED ORDER — SIMVASTATIN 10 MG PO TABS: ORAL_TABLET | ORAL | Status: DC

## 2015-09-13 ENCOUNTER — Other Ambulatory Visit: Payer: Self-pay

## 2015-09-13 DIAGNOSIS — E785 Hyperlipidemia, unspecified: Secondary | ICD-10-CM

## 2015-09-13 MED ORDER — SIMVASTATIN 10 MG PO TABS: ORAL_TABLET | ORAL | Status: DC

## 2015-10-10 ENCOUNTER — Other Ambulatory Visit: Payer: Self-pay | Admitting: *Deleted

## 2015-10-10 DIAGNOSIS — M5416 Radiculopathy, lumbar region: Secondary | ICD-10-CM

## 2015-10-10 MED ORDER — GABAPENTIN 300 MG PO CAPS: 300.0000 mg | ORAL_CAPSULE | Freq: Every day | ORAL | Status: DC

## 2015-10-10 MED ORDER — IBUPROFEN 800 MG PO TABS: 800.0000 mg | ORAL_TABLET | Freq: Three times a day (TID) | ORAL | Status: DC | PRN

## 2015-10-10 NOTE — Telephone Encounter (Signed)
Both Rx called in to pharmacy. 

## 2016-01-20 DIAGNOSIS — Z79899 Other long term (current) drug therapy: Secondary | ICD-10-CM | POA: Diagnosis not present

## 2016-01-20 DIAGNOSIS — E7800 Pure hypercholesterolemia, unspecified: Secondary | ICD-10-CM | POA: Diagnosis not present

## 2016-02-03 DIAGNOSIS — R739 Hyperglycemia, unspecified: Secondary | ICD-10-CM | POA: Diagnosis not present

## 2016-02-03 DIAGNOSIS — E7800 Pure hypercholesterolemia, unspecified: Secondary | ICD-10-CM | POA: Diagnosis not present

## 2016-02-03 DIAGNOSIS — Z79899 Other long term (current) drug therapy: Secondary | ICD-10-CM | POA: Diagnosis not present

## 2016-02-03 DIAGNOSIS — Z0000 Encounter for general adult medical examination without abnormal findings: Secondary | ICD-10-CM | POA: Diagnosis not present

## 2016-02-11 ENCOUNTER — Other Ambulatory Visit: Payer: Self-pay | Admitting: Family Medicine

## 2016-02-11 DIAGNOSIS — Z1231 Encounter for screening mammogram for malignant neoplasm of breast: Secondary | ICD-10-CM

## 2016-04-22 ENCOUNTER — Other Ambulatory Visit: Payer: Self-pay | Admitting: Family Medicine

## 2016-04-22 ENCOUNTER — Ambulatory Visit
Admission: RE | Admit: 2016-04-22 | Discharge: 2016-04-22 | Disposition: A | Discharge status: Home / Self Care | Payer: Medicare Other | Source: Ambulatory Visit | Attending: Family Medicine | Admitting: Family Medicine

## 2016-04-22 DIAGNOSIS — Z1231 Encounter for screening mammogram for malignant neoplasm of breast: Secondary | ICD-10-CM

## 2016-06-01 DIAGNOSIS — G8929 Other chronic pain: Secondary | ICD-10-CM | POA: Diagnosis not present

## 2016-06-01 DIAGNOSIS — M545 Low back pain: Secondary | ICD-10-CM | POA: Diagnosis not present

## 2016-06-17 ENCOUNTER — Ambulatory Visit: Payer: Medicare Other | Attending: Family Medicine

## 2016-06-17 DIAGNOSIS — M6281 Muscle weakness (generalized): Secondary | ICD-10-CM | POA: Diagnosis not present

## 2016-06-17 DIAGNOSIS — M545 Low back pain: Secondary | ICD-10-CM | POA: Insufficient documentation

## 2016-06-17 DIAGNOSIS — R262 Difficulty in walking, not elsewhere classified: Secondary | ICD-10-CM | POA: Insufficient documentation

## 2016-06-17 NOTE — Therapy (Signed)
[facility] MEDICINE [address], [place], [address] Phone: [phone]   Fax:  [phone]  Physical Therapy Evaluation  Patient Details  Name: Teresa [name] MRN: [id number] Date of Birth: [birth year] Referring Provider: Nadara Mustard, MD  Encounter Date: 06/17/2016      PT End of Session - 06/17/16 0935    Visit Number 1   Number of Visits 13   Date for PT Re-Evaluation 07/30/16   Authorization Type 1   Authorization Time Period of 10   PT Start Time 0935   PT Stop Time 1033   PT Time Calculation (min) 58 min   Activity Tolerance Patient tolerated treatment well   Behavior During Therapy [facility] for tasks assessed/performed      Past Medical History:  Diagnosis Date  . Allergic rhinitis   . Anxiety   . Depression   . Hyperlipidemia     Past Surgical History:  Procedure Laterality Date  . ABDOMINAL HYSTERECTOMY    . BACK SURGERY     2000  . TUBAL LIGATION      There were no vitals filed for this visit.       Subjective Assessment - 06/17/16 0938    Subjective Back pain: 8/10 at worst; 5/10 currently (pt sitting), 5/10 at best. Bilateral LE (posterior LE): 6/10 at worst, 2/10 current (pt sitting).    Pertinent History Pt states that walking (3-4 minutes) a lot bothers her back, as well as sitting for a long period of time  (1 hour). Pt also states needing a shoping cart to help her walk in the grocery store. Difficulty standing up straight. Had a back surgery for a ruptured disc in her back in 2000 which helped. MD told pt that she will always have pain. Symptoms increased in 2016 but pt does not know what caused it. Patient also states feeling pain bilateral posterior LE especially when she stands up straight.   Pt also adds that following her back surgery in 2000, she had R foot drop which got better.    Patient Stated Goals I want to walk in the park without my back hurting.    Currently in  Pain? Yes   Pain Score 5    Pain Location Back   Aggravating Factors  sitting greater than an hour, standing too long, walking greater than 3-4 min, doing chores   Pain Relieving Factors rest, wearing shoes with support or memory foam            [facility] PT Assessment - 06/17/16 0937      Assessment   Medical Diagnosis Chronic midline low back pain without sciatica   Referring Provider Nadara Mustard, MD   Onset Date/Surgical Date 06/11/16  Date PT referral signed. Chronic condition worse in 2016   Prior Therapy Had prior PT 2016 for her back. Still has sharp pain. MD told her she will always have some back problems.      Precautions   Precaution Comments hx of back surgery     Balance Screen   Has the patient fallen in the past 6 months No   Has the patient had a decrease in activity level because of a fear of falling?  No   Is the patient reluctant to leave their home because of a fear of falling?  No     Prior Function   Vocation On disability   Vocation Requirements PLOF: better able to sit and  stand for a longer period of time. Better able to perform chores and walk.      Observation/Other Assessments   Observations (+) long sit test suggesting anterior nutation of L innominate   Modified Oswertry 24%     Posture/Postural Control   Posture Comments L lateral shift, bilateral foot pronation, forward flexed, decreased lordosis      AROM   Lumbar Flexion WFL. Pt feels relief of symptoms    Lumbar Extension limited with low back pain with bilateral LE symptoms   Lumbar - Right Side Bend limited with bilateral LE discomfort   Lumbar - Left Side Bend limited with bilateral LE discomfort   Lumbar - Right Rotation limited with low back and bilateral LE pain   Lumbar - Left Rotation limited with low back and bilateral LE pain     PROM   Overall PROM Comments R hip extension: -7 degrees   Left Hip Extension -5 degrees     Strength   Right Hip Flexion 4/5   Right  Hip Extension 3-/5   Right Hip ABduction 3+/5   Left Hip Flexion 4-/5   Left Hip Extension 3-/5   Left Hip ABduction 3+/5   Right Knee Flexion 4-/5   Right Knee Extension 5/5   Left Knee Flexion 4-/5   Left Knee Extension 5/5   Right Ankle Dorsiflexion 4-/5   Left Ankle Dorsiflexion 4/5     Palpation   Palpation comment decreased P to A mobility mid thoracic spine     Ambulation/Gait   Gait Comments Lateral lean during ipsilateral stance bilaterally           Objectives   There-ex  Directed patient with prone glute max/quad set 10x5 seconds, then 5x5 seconds each LE  Reviewed and given as part of her HEP. Pt demonstrated and verbalized understanding.    Improved exercise technique, movement at target joints, use of target muscles after mod verbal, visual, tactile cues.                    PT Education - 06/17/16 1024    Education provided Yes   Education Details ther-ex, HEP, plan of care   Person(s) Educated Patient   Methods Explanation;Demonstration;Tactile cues;Verbal cues;Handout   Comprehension Returned demonstration;Verbalized understanding             PT Long Term Goals - 06/17/16 1930      PT LONG TERM GOAL #1   Title Patient will have a decrease in back pain to 4/10 or less at worst to promote ability to ambulate, perform chores, tolerate sitting and standing.    Baseline 8/10 low back pain at worst   Time 6   Period Weeks   Status New     PT LONG TERM GOAL #2   Title Patient will have a decrease in bilateral LE pain to 2/10 or less at worst to promote ability to ambulate, perform chores, tolerate sitting and standing.    Baseline 6/10 bilateral LE pain at worst.    Time 6   Period Weeks   Status New     PT LONG TERM GOAL #3   Title Patient will improve bilateral hip extension ROM to at least 0 degrees to promote ability to tolerate standing and perform standing tasks.    Baseline PROM: -7 degrees R hip extension, -5 degrees L  hip extension    Time 6   Period Weeks   Status New     PT  LONG TERM GOAL #4   Title Patient will improve bilateral hip extension and hip abduction strength by at least 1/2 MMT grade to promote ability to ambulate, and perform chores with less back and LE pain.    Time 6   Period Weeks   Status New     PT LONG TERM GOAL #5   Title Patient will improve her Modified Oswestry Low Back Pain Disability Questionnaire score by at least 10% as a demonstration of improved function.    Baseline 24%   Time 6   Period Weeks               Plan - 06/17/16 1026    Clinical Impression Statement Patient is a 65 year old female who came to physical therapy secondary to low back and bilateral LE pain. She also presents with altered gait pattern and posture, bilateral hip weakness, decreased lumbopelvic control, reproduction of symptoms with lumbar AROM; decreased thoracic mobility, decreased hip extension, and difficulty performing functional tasks such as walking, and performing chores.  Patient will benefit from skilled physical therapy services to address the aforementioned deficits.    Rehab Potential Fair   Clinical Impairments Affecting Rehab Potential Chronicity of condition   PT Frequency 2x / week   PT Duration 6 weeks   PT Treatment/Interventions Therapeutic exercise;Therapeutic activities;Neuromuscular re-education;Patient/family education;Manual techniques;Dry needling;Gait training;Electrical Stimulation;Ultrasound;Aquatic Therapy   PT Next Visit Plan posture, hip strengthening, lumbopelvic control   Consulted and Agree with Plan of Care Patient      Patient will benefit from skilled therapeutic intervention in order to improve the following deficits and impairments:  Pain, Postural dysfunction, Improper body mechanics, Difficulty walking, Decreased strength, Decreased range of motion  Visit Diagnosis: Bilateral low back pain, with sciatica presence unspecified - Plan: PT plan of  care cert/re-cert  Muscle weakness (generalized) - Plan: PT plan of care cert/re-cert  Difficulty in walking, not elsewhere classified - Plan: PT plan of care cert/re-cert      G-Codes - 06/17/16 1956    Functional Assessment Tool Used Modified Oswestry Low Back Pain Disability Questionnaire, clinical presentation, patient interview   Functional Limitation Mobility: Walking and moving around   Mobility: Walking and Moving Around Current Status ([id number]) At least 40 percent but less than 60 percent impaired, limited or restricted   Mobility: Walking and Moving Around Goal Status [phone](G8979) At least 20 percent but less than 40 percent impaired, limited or restricted       Problem List Patient Active Problem List   Diagnosis Date Noted  . Lumbar radiculopathy 02/20/2015  . Headache 02/20/2015  . Elevated CPK 02/20/2015  . Cystitis, acute 04/02/2014  . Breast cancer screening 01/22/2014  . Preventative health care 12/12/2012  . Status post lumbar laminectomy 10/06/2012  . History of falling 10/06/2012  . HEAD TRAUMA, MILD 11/29/2008  . GERD 12/23/2006  . NUMBNESS, leg 12/23/2006  . Dyslipidemia 11/02/2006  . Allergic rhinitis 09/28/2006  . HOT FLASHES 09/28/2006  . BACK PAIN 09/28/2006    Thank you for your referral.   Loralyn FreshwaterMiguel Tiffannie Sloss PT, DPT   06/17/2016, 8:02 PM  [facility] [address] [place], [place], [address] Phone: [phone]   Fax:  [phone]  Name: Teresa [name] MRN: [id number] Date of Birth: 08-07-1951

## 2016-06-17 NOTE — Patient Instructions (Signed)
   Quadriceps Set (Prone)   With toes supporting lower legs, squeeze rear end muscles and tighten thigh muscles to straighten knees. Hold _5___ seconds. Relax. Repeat __10__ times per set. Do ___3_ sets per session. Do ___1_ sessions per day.  http://orth.exer.us/726   Copyright  VHI. All rights reserved.   

## 2016-06-23 ENCOUNTER — Ambulatory Visit: Payer: Medicare Other

## 2016-06-25 ENCOUNTER — Ambulatory Visit: Payer: Medicare Other

## 2016-06-25 DIAGNOSIS — M545 Low back pain: Secondary | ICD-10-CM

## 2016-06-25 DIAGNOSIS — R262 Difficulty in walking, not elsewhere classified: Secondary | ICD-10-CM

## 2016-06-25 DIAGNOSIS — M6281 Muscle weakness (generalized): Secondary | ICD-10-CM

## 2016-06-25 NOTE — Patient Instructions (Addendum)
     Transversus abdominis contraction.    Pretend that there is a string attached from one side of your pelvis to the other.    Tighten that "string."   Hold for 5 seconds.   Perform throughout the day.     Also gave seated L hip extension isometrics, and seated R hip flexion isometrics 10x3 with 5 seconds as part of her HEP. Handout provided. Also gave pelvic floor muscle contraction 5 seconds throughout the day as part of her HEP. Pt demonstrated and verbalized understanding.

## 2016-06-25 NOTE — Therapy (Signed)
[facility]ORTS MEDICINE [address], [place], [address] Phone: [phone]   Fax:  [phone]  Physical Therapy Treatment  Patient Details  Name: Teresa [name] MRN: [id number] Date of Birth: Jun 12, 1951 Referring Provider: Nadara Mustard, MD  Encounter Date: 06/25/2016      PT End of Session - 06/25/16 1616    Visit Number 2   Number of Visits 13   Date for PT Re-Evaluation 07/30/16   Authorization Type 2   Authorization Time Period of 10   PT Start Time 1616   PT Stop Time 1700   PT Time Calculation (min) 44 min   Activity Tolerance Patient tolerated treatment well   Behavior During Therapy [facility] for tasks assessed/performed      Past Medical History:  Diagnosis Date  . Allergic rhinitis   . Anxiety   . Depression   . Hyperlipidemia     Past Surgical History:  Procedure Laterality Date  . ABDOMINAL HYSTERECTOMY    . BACK SURGERY     2000  . TUBAL LIGATION      There were no vitals filed for this visit.      Subjective Assessment - 06/25/16 1621    Subjective Has been doing her exercises. Has a hard time with her L LE afterwards. Had to wait a couple of days for it to feel better. 6/10 back pain currently.    Pertinent History Pt states that walking (3-4 minutes) a lot bothers her back, as well as sitting for a long period of time  (1 hour). Pt also states needing a shoping cart to help her walk in the grocery store. Difficulty standing up straight. Had a back surgery for a ruptured disc in her back in 2000 which helped. MD told pt that she will always have pain. Symptoms increased in 2016 but pt does not know what caused it. Patient also states feeling pain bilateral posterior LE especially when she stands up straight.   Pt also adds that following her back surgery in 2000, she had R foot drop which got better.    Patient Stated Goals I want to walk in the park without my back hurting.                             Objectives   There-ex  Directed patient with seated L hip extension isometrics 10x3 with 5 second holds Seated R hip flexion isometrics 10x3 with 5 second holds Then both at the same time 10x3 with 5 second holds  Corrected prone glute max/quad set exercise (pt was extending both LE up)  Directed patient with 10x5 seconds for 3 sets. No pain.   Supine transversus abdominis contraction 10x3 with 5 seconds   Supine pelvic floor contraction 10x5 seconds.     Improved exercise technique, movement at target joints, use of target muscles after mod verbal, visual, tactile cues.      Decreased back pain with walking after using muscles to promote proper pelvic positioning followed by activating core muscles. No back pain after session.        PT Education - 06/25/16 1625    Education provided Yes   Education Details ther-ex   [employer]) Educated Patient   Methods Explanation;Demonstration;Tactile cues;Verbal cues   Comprehension Returned demonstration;Verbalized understanding             PT Long Term Goals - 06/17/16 1930  PT LONG TERM GOAL #1   Title Patient will have a decrease in back pain to 4/10 or less at worst to promote ability to ambulate, perform chores, tolerate sitting and standing.    Baseline 8/10 low back pain at worst   Time 6   Period Weeks   Status New     PT LONG TERM GOAL #2   Title Patient will have a decrease in bilateral LE pain to 2/10 or less at worst to promote ability to ambulate, perform chores, tolerate sitting and standing.    Baseline 6/10 bilateral LE pain at worst.    Time 6   Period Weeks   Status New     PT LONG TERM GOAL #3   Title Patient will improve bilateral hip extension ROM to at least 0 degrees to promote ability to tolerate standing and perform standing tasks.    Baseline PROM: -7 degrees R hip extension, -5 degrees L hip extension    Time 6   Period Weeks   Status New      PT LONG TERM GOAL #4   Title Patient will improve bilateral hip extension and hip abduction strength by at least 1/2 MMT grade to promote ability to ambulate, and perform chores with less back and LE pain.    Time 6   Period Weeks   Status New     PT LONG TERM GOAL #5   Title Patient will improve her Modified Oswestry Low Back Pain Disability Questionnaire score by at least 10% as a demonstration of improved function.    Baseline 24%   Time 6   Period Weeks               Plan - 06/25/16 1627    Clinical Impression Statement Decreased back pain with walking after using muscles to promote proper pelvic positioning followed by activating core muscles. No back pain after session.   Rehab Potential Fair   Clinical Impairments Affecting Rehab Potential Chronicity of condition   PT Frequency 2x / week   PT Duration 6 weeks   PT Treatment/Interventions Therapeutic exercise;Therapeutic activities;Neuromuscular re-education;Patient/family education;Manual techniques;Dry needling;Gait training;Electrical Stimulation;Ultrasound;Aquatic Therapy   PT Next Visit Plan posture, hip strengthening, lumbopelvic control   Consulted and Agree with Plan of Care Patient      Patient will benefit from skilled therapeutic intervention in order to improve the following deficits and impairments:  Pain, Postural dysfunction, Improper body mechanics, Difficulty walking, Decreased strength, Decreased range of motion  Visit Diagnosis: Bilateral low back pain, with sciatica presence unspecified  Muscle weakness (generalized)  Difficulty in walking, not elsewhere classified     Problem List Patient Active Problem List   Diagnosis Date Noted  . Lumbar radiculopathy 02/20/2015  . Headache 02/20/2015  . Elevated CPK 02/20/2015  . Cystitis, acute 04/02/2014  . Breast cancer screening 01/22/2014  . Preventative health care 12/12/2012  . Status post lumbar laminectomy 10/06/2012  . History of  falling 10/06/2012  . HEAD TRAUMA, MILD 11/29/2008  . GERD 12/23/2006  . NUMBNESS, leg 12/23/2006  . Dyslipidemia 11/02/2006  . Allergic rhinitis 09/28/2006  . HOT FLASHES 09/28/2006  . BACK PAIN 09/28/2006    Loralyn FreshwaterMiguel Keslyn Teater PT, DPT   06/25/2016, 7:32 PM  [facility] [address] [place], [place], [address] Phone: [phone]   Fax:  [phone]  Name: Teresa [name] MRN: [id number] Date of Birth: Jul 06, 1951

## 2016-07-01 ENCOUNTER — Ambulatory Visit: Payer: Medicare Other

## 2016-07-07 ENCOUNTER — Ambulatory Visit: Payer: Medicare Other

## 2016-07-09 ENCOUNTER — Ambulatory Visit: Payer: Medicare Other | Attending: Family Medicine

## 2016-07-09 ENCOUNTER — Telehealth: Payer: Self-pay

## 2016-07-09 DIAGNOSIS — R262 Difficulty in walking, not elsewhere classified: Secondary | ICD-10-CM

## 2016-07-09 DIAGNOSIS — M545 Low back pain: Secondary | ICD-10-CM

## 2016-07-09 DIAGNOSIS — M6281 Muscle weakness (generalized): Secondary | ICD-10-CM

## 2016-07-09 NOTE — Therapy (Signed)
[place] [facility] SPORTS MEDIC[facility] [address], [place], [address] Phone: [phone]   Fax:  [phone]  Physical Therapy Discharge Summary (06/17/2016 - 07/09/2016)  Patient Details  Name: Teresa [name] MRN: [id number] Date of Birth: 1951-04-22 Referring Provider: Barnabas Lister, MD  Encounter Date: 07/09/2016     The above patient had been seen in Physical Therapy 2 times of 8 treatments scheduled with 1 no shows and 5 cancellations.     Past Medical History:  Diagnosis Date  . Allergic rhinitis   . Anxiety   . Depression   . Hyperlipidemia     Past Surgical History:  Procedure Laterality Date  . ABDOMINAL HYSTERECTOMY    . BACK SURGERY     2000  . TUBAL LIGATION      There were no vitals filed for this visit.            PT Long Term Goals - 07/09/16 1008      PT LONG TERM GOAL #1   Title Patient will have a decrease in back pain to 4/10 or less at worst to promote ability to ambulate, perform chores, tolerate sitting and standing.    Baseline 8/10 low back pain at worst. Pt still has pain > 4/10 (07/09/16)   Time 6   Period Weeks   Status Not Met     PT LONG TERM GOAL #2   Title Patient will have a decrease in bilateral LE pain to 2/10 or less at worst to promote ability to ambulate, perform chores, tolerate sitting and standing.    Baseline 6/10 bilateral LE pain at worst. Pt still has pain > 2/10 (07/09/16)   Time 6   Period Weeks   Status Not Met     PT LONG TERM GOAL #3   Title Patient will improve bilateral hip extension ROM to at least 0 degrees to promote ability to tolerate standing and perform standing tasks.    Baseline PROM: -7 degrees R hip extension, -5 degrees L hip extension. Unable to assess secondary to pt only being able to attend 2 sessions (07/09/16).   Time 6   Period Weeks   Status Unable to assess     PT LONG TERM GOAL #4   Title Patient will improve bilateral hip  extension and hip abduction strength by at least 1/2 MMT grade to promote ability to ambulate, and perform chores with less back and LE pain.    Baseline Unable to assess secondary to pt only able to attend 2 sessions (07/09/16)   Time 6   Period Weeks   Status Unable to assess     PT LONG TERM GOAL #5   Title Patient will improve her Modified Oswestry Low Back Pain Disability Questionnaire score by at least 10% as a demonstration of improved function.    Baseline 24%.  Unable to assess secondary to pt only able to attend 2 sessions (07/09/16)   Time 6   Period Weeks   Status Unable to assess               Plan - 07/09/16 1024    Clinical Impression Statement Patient was able to attend only 2 physical therapy visits (evaluation and 1 follow up appointment). Patient tolerated her most recent physical therapy session (06/25/2016) very well with no back pain (per pt) walking afterwards. Pt however was unable to attend her following appointments due to having to pick up her granddaughter from  school as well as due to back pain. Called patient this morning to check up on her and pt said that she is currently at the hospital due to pain but she will be ok.  Pt also said that her doctor told her that due to her surgery, she will always have back pain. Pt states wanting to hold off physical therapy for now but will try to continue her home exercises. Skilled physical therapy services discharged secondary to patient request.    Rehab Potential Fair   Clinical Impairments Affecting Rehab Potential Chronicity of condition   PT Treatment/Interventions Therapeutic exercise;Therapeutic activities;Neuromuscular re-education;Patient/family education   PT Next Visit Plan Skilled physical therapy services discharged per pt request.    Consulted and Agree with Plan of Care Patient      Patient will benefit from skilled therapeutic intervention in order to improve the following deficits and impairments:   Pain, Postural dysfunction, Improper body mechanics, Difficulty walking, Decreased strength, Decreased range of motion  Visit Diagnosis: Bilateral low back pain, with sciatica presence unspecified  Muscle weakness (generalized)  Difficulty in walking, not elsewhere classified       G-Codes - Aug 07, 2016 1011    Functional Assessment Tool Used Patient interview, clinical prestentation/notes (such as visits and reasons for cancellations   Functional Limitation Mobility: Walking and moving around   Mobility: Walking and Moving Around Current Status (C7893) At least 40 percent but less than 60 percent impaired, limited or restricted   Mobility: Walking and Moving Around Goal Status [phone]) --   Mobility: Walking and Moving Around Discharge Status [phone]) At least 60 percent but less than 80 percent impaired, limited or restricted      Problem List Patient Active Problem List   Diagnosis Date Noted  . Lumbar radiculopathy 02/20/2015  . Headache 02/20/2015  . Elevated CPK 02/20/2015  . Cystitis, acute 04/02/2014  . Breast cancer screening 01/22/2014  . Preventative health care 12/12/2012  . Status post lumbar laminectomy 10/06/2012  . History of falling 10/06/2012  . HEAD TRAUMA, MILD 11/29/2008  . GERD 12/23/2006  . NUMBNESS, leg 12/23/2006  . Dyslipidemia 11/02/2006  . Allergic rhinitis 09/28/2006  . HOT FLASHES 09/28/2006  . BACK PAIN 09/28/2006    Thank you for your referral.   Joneen Boers PT, DPT   07/10/2016, 2:11 PM  [facility] [address], [place], [address] Phone: [phone]   Fax:  [phone]  Name: Teresa [name] MRN: [id number] Date of Birth: 09/28/1951

## 2016-07-09 NOTE — Telephone Encounter (Signed)
No show. Called patient who said that she is in the hospital right now due to back pain. Said that she will be ok. Also said that her doctor told her that due to the surgery, she will always have back pain. Wants to hold off physical therapy for now but will try to continue her home exercises.

## 2016-07-13 ENCOUNTER — Ambulatory Visit: Payer: Medicare Other

## 2016-08-11 DIAGNOSIS — Z79899 Other long term (current) drug therapy: Secondary | ICD-10-CM | POA: Diagnosis not present

## 2016-08-11 DIAGNOSIS — E7800 Pure hypercholesterolemia, unspecified: Secondary | ICD-10-CM | POA: Diagnosis not present

## 2016-08-11 DIAGNOSIS — R739 Hyperglycemia, unspecified: Secondary | ICD-10-CM | POA: Diagnosis not present

## 2016-08-11 DIAGNOSIS — M545 Low back pain: Secondary | ICD-10-CM | POA: Diagnosis not present

## 2016-08-11 DIAGNOSIS — G8929 Other chronic pain: Secondary | ICD-10-CM | POA: Diagnosis not present

## 2017-03-12 ENCOUNTER — Other Ambulatory Visit: Payer: Self-pay | Admitting: Family Medicine

## 2017-03-12 DIAGNOSIS — Z1231 Encounter for screening mammogram for malignant neoplasm of breast: Secondary | ICD-10-CM

## 2017-04-23 ENCOUNTER — Ambulatory Visit
Admission: RE | Admit: 2017-04-23 | Discharge: 2017-04-23 | Disposition: A | Discharge status: Home / Self Care | Payer: Medicare HMO | Source: Ambulatory Visit | Attending: Family Medicine | Admitting: Family Medicine

## 2017-04-23 DIAGNOSIS — Z1231 Encounter for screening mammogram for malignant neoplasm of breast: Secondary | ICD-10-CM | POA: Insufficient documentation

## 2017-06-23 DIAGNOSIS — E7800 Pure hypercholesterolemia, unspecified: Secondary | ICD-10-CM | POA: Diagnosis not present

## 2017-06-23 DIAGNOSIS — R739 Hyperglycemia, unspecified: Secondary | ICD-10-CM | POA: Diagnosis not present

## 2017-06-23 DIAGNOSIS — Z79899 Other long term (current) drug therapy: Secondary | ICD-10-CM | POA: Diagnosis not present

## 2017-06-25 DIAGNOSIS — Z0000 Encounter for general adult medical examination without abnormal findings: Secondary | ICD-10-CM | POA: Diagnosis not present

## 2017-06-25 DIAGNOSIS — Z79899 Other long term (current) drug therapy: Secondary | ICD-10-CM | POA: Diagnosis not present

## 2017-06-25 DIAGNOSIS — M5442 Lumbago with sciatica, left side: Secondary | ICD-10-CM | POA: Diagnosis not present

## 2017-06-25 DIAGNOSIS — E7800 Pure hypercholesterolemia, unspecified: Secondary | ICD-10-CM | POA: Diagnosis not present

## 2017-06-25 DIAGNOSIS — E119 Type 2 diabetes mellitus without complications: Secondary | ICD-10-CM | POA: Diagnosis not present

## 2017-06-25 DIAGNOSIS — G8929 Other chronic pain: Secondary | ICD-10-CM | POA: Diagnosis not present

## 2017-06-25 DIAGNOSIS — M5441 Lumbago with sciatica, right side: Secondary | ICD-10-CM | POA: Diagnosis not present

## 2017-08-27 ENCOUNTER — Other Ambulatory Visit: Payer: Self-pay | Admitting: Physical Medicine and Rehabilitation

## 2017-08-27 DIAGNOSIS — M5136 Other intervertebral disc degeneration, lumbar region: Secondary | ICD-10-CM | POA: Diagnosis not present

## 2017-08-27 DIAGNOSIS — M5416 Radiculopathy, lumbar region: Secondary | ICD-10-CM

## 2017-09-03 ENCOUNTER — Ambulatory Visit: Payer: Medicare Other

## 2017-09-17 ENCOUNTER — Ambulatory Visit: Payer: Medicare HMO

## 2017-11-08 ENCOUNTER — Ambulatory Visit: Payer: Medicare HMO

## 2017-11-12 ENCOUNTER — Ambulatory Visit: Payer: Medicare HMO

## 2017-11-15 ENCOUNTER — Ambulatory Visit: Payer: Medicare Other

## 2017-11-22 ENCOUNTER — Ambulatory Visit
Admission: RE | Admit: 2017-11-22 | Discharge: 2017-11-22 | Disposition: A | Discharge status: Home / Self Care | Payer: Medicare HMO | Source: Ambulatory Visit | Attending: Physical Medicine and Rehabilitation | Admitting: Physical Medicine and Rehabilitation

## 2017-11-22 DIAGNOSIS — M4807 Spinal stenosis, lumbosacral region: Secondary | ICD-10-CM | POA: Insufficient documentation

## 2017-11-22 DIAGNOSIS — M5126 Other intervertebral disc displacement, lumbar region: Secondary | ICD-10-CM | POA: Diagnosis not present

## 2017-11-22 DIAGNOSIS — M5127 Other intervertebral disc displacement, lumbosacral region: Secondary | ICD-10-CM | POA: Insufficient documentation

## 2017-11-22 DIAGNOSIS — M5134 Other intervertebral disc degeneration, thoracic region: Secondary | ICD-10-CM | POA: Insufficient documentation

## 2017-11-22 DIAGNOSIS — M5416 Radiculopathy, lumbar region: Secondary | ICD-10-CM | POA: Insufficient documentation

## 2017-11-22 DIAGNOSIS — M1288 Other specific arthropathies, not elsewhere classified, other specified site: Secondary | ICD-10-CM | POA: Insufficient documentation

## 2017-11-22 DIAGNOSIS — M545 Low back pain: Secondary | ICD-10-CM | POA: Diagnosis not present

## 2017-11-22 DIAGNOSIS — M48061 Spinal stenosis, lumbar region without neurogenic claudication: Secondary | ICD-10-CM | POA: Insufficient documentation

## 2017-12-06 DIAGNOSIS — M5416 Radiculopathy, lumbar region: Secondary | ICD-10-CM | POA: Diagnosis not present

## 2017-12-06 DIAGNOSIS — M48062 Spinal stenosis, lumbar region with neurogenic claudication: Secondary | ICD-10-CM | POA: Diagnosis not present

## 2017-12-06 DIAGNOSIS — M5136 Other intervertebral disc degeneration, lumbar region: Secondary | ICD-10-CM | POA: Diagnosis not present

## 2018-01-14 DIAGNOSIS — E7800 Pure hypercholesterolemia, unspecified: Secondary | ICD-10-CM | POA: Diagnosis not present

## 2018-01-14 DIAGNOSIS — M545 Low back pain: Secondary | ICD-10-CM | POA: Diagnosis not present

## 2018-01-14 DIAGNOSIS — G8929 Other chronic pain: Secondary | ICD-10-CM | POA: Diagnosis not present

## 2018-01-14 DIAGNOSIS — E119 Type 2 diabetes mellitus without complications: Secondary | ICD-10-CM | POA: Diagnosis not present

## 2018-01-14 DIAGNOSIS — Z79899 Other long term (current) drug therapy: Secondary | ICD-10-CM | POA: Diagnosis not present

## 2018-01-14 DIAGNOSIS — R739 Hyperglycemia, unspecified: Secondary | ICD-10-CM | POA: Diagnosis not present

## 2018-03-10 ENCOUNTER — Other Ambulatory Visit: Payer: Self-pay | Admitting: Family Medicine

## 2018-03-10 DIAGNOSIS — Z1231 Encounter for screening mammogram for malignant neoplasm of breast: Secondary | ICD-10-CM

## 2018-03-14 DIAGNOSIS — M5416 Radiculopathy, lumbar region: Secondary | ICD-10-CM | POA: Diagnosis not present

## 2018-03-14 DIAGNOSIS — M5136 Other intervertebral disc degeneration, lumbar region: Secondary | ICD-10-CM | POA: Diagnosis not present

## 2018-03-14 DIAGNOSIS — M48062 Spinal stenosis, lumbar region with neurogenic claudication: Secondary | ICD-10-CM | POA: Diagnosis not present

## 2018-04-25 ENCOUNTER — Ambulatory Visit
Admission: RE | Admit: 2018-04-25 | Discharge: 2018-04-25 | Disposition: A | Discharge status: Home / Self Care | Payer: Medicare HMO | Source: Ambulatory Visit | Attending: Family Medicine | Admitting: Family Medicine

## 2018-04-25 DIAGNOSIS — Z1231 Encounter for screening mammogram for malignant neoplasm of breast: Secondary | ICD-10-CM | POA: Diagnosis not present

## 2018-09-09 DIAGNOSIS — Z79899 Other long term (current) drug therapy: Secondary | ICD-10-CM | POA: Diagnosis not present

## 2018-09-09 DIAGNOSIS — E7800 Pure hypercholesterolemia, unspecified: Secondary | ICD-10-CM | POA: Diagnosis not present

## 2018-09-09 DIAGNOSIS — R739 Hyperglycemia, unspecified: Secondary | ICD-10-CM | POA: Diagnosis not present

## 2018-09-16 DIAGNOSIS — Z0000 Encounter for general adult medical examination without abnormal findings: Secondary | ICD-10-CM | POA: Diagnosis not present

## 2018-09-16 DIAGNOSIS — Z23 Encounter for immunization: Secondary | ICD-10-CM | POA: Diagnosis not present

## 2019-04-13 ENCOUNTER — Other Ambulatory Visit: Payer: Self-pay | Admitting: Family Medicine

## 2019-04-13 DIAGNOSIS — Z1231 Encounter for screening mammogram for malignant neoplasm of breast: Secondary | ICD-10-CM

## 2019-05-24 ENCOUNTER — Ambulatory Visit
Admission: RE | Admit: 2019-05-24 | Discharge: 2019-05-24 | Disposition: A | Discharge status: Home / Self Care | Payer: Medicare HMO | Source: Ambulatory Visit | Attending: Family Medicine | Admitting: Family Medicine

## 2019-05-24 ENCOUNTER — Other Ambulatory Visit: Payer: Self-pay

## 2019-05-24 DIAGNOSIS — Z1231 Encounter for screening mammogram for malignant neoplasm of breast: Secondary | ICD-10-CM

## 2019-12-22 ENCOUNTER — Other Ambulatory Visit: Payer: Self-pay | Admitting: Student

## 2019-12-22 DIAGNOSIS — M48062 Spinal stenosis, lumbar region with neurogenic claudication: Secondary | ICD-10-CM

## 2019-12-22 DIAGNOSIS — M4807 Spinal stenosis, lumbosacral region: Secondary | ICD-10-CM

## 2019-12-22 DIAGNOSIS — G9519 Other vascular myelopathies: Secondary | ICD-10-CM

## 2020-04-15 ENCOUNTER — Other Ambulatory Visit: Payer: Self-pay | Admitting: Family Medicine

## 2020-04-15 DIAGNOSIS — Z1231 Encounter for screening mammogram for malignant neoplasm of breast: Secondary | ICD-10-CM

## 2020-05-27 ENCOUNTER — Ambulatory Visit
Admission: RE | Admit: 2020-05-27 | Discharge: 2020-05-27 | Disposition: A | Discharge status: Home / Self Care | Payer: Medicare HMO | Source: Ambulatory Visit | Attending: Family Medicine | Admitting: Family Medicine

## 2020-05-27 ENCOUNTER — Other Ambulatory Visit: Payer: Self-pay

## 2020-05-27 DIAGNOSIS — Z1231 Encounter for screening mammogram for malignant neoplasm of breast: Secondary | ICD-10-CM | POA: Diagnosis not present

## 2021-05-13 ENCOUNTER — Other Ambulatory Visit: Payer: Self-pay | Admitting: Family Medicine

## 2021-05-13 DIAGNOSIS — Z1231 Encounter for screening mammogram for malignant neoplasm of breast: Secondary | ICD-10-CM

## 2021-06-03 ENCOUNTER — Ambulatory Visit
Admission: RE | Admit: 2021-06-03 | Discharge: 2021-06-03 | Disposition: A | Discharge status: Home / Self Care | Payer: Medicare HMO | Source: Ambulatory Visit | Attending: Family Medicine | Admitting: Family Medicine

## 2021-06-03 ENCOUNTER — Other Ambulatory Visit: Payer: Self-pay

## 2021-06-03 DIAGNOSIS — Z1231 Encounter for screening mammogram for malignant neoplasm of breast: Secondary | ICD-10-CM | POA: Insufficient documentation

## 2021-09-23 DIAGNOSIS — M5416 Radiculopathy, lumbar region: Secondary | ICD-10-CM | POA: Diagnosis not present

## 2021-10-01 DIAGNOSIS — Z79899 Other long term (current) drug therapy: Secondary | ICD-10-CM | POA: Diagnosis not present

## 2021-10-01 DIAGNOSIS — E119 Type 2 diabetes mellitus without complications: Secondary | ICD-10-CM | POA: Diagnosis not present

## 2021-10-01 DIAGNOSIS — E7800 Pure hypercholesterolemia, unspecified: Secondary | ICD-10-CM | POA: Diagnosis not present

## 2021-10-07 DIAGNOSIS — Z0000 Encounter for general adult medical examination without abnormal findings: Secondary | ICD-10-CM | POA: Diagnosis not present

## 2021-10-07 DIAGNOSIS — Z23 Encounter for immunization: Secondary | ICD-10-CM | POA: Diagnosis not present

## 2021-10-07 DIAGNOSIS — E1169 Type 2 diabetes mellitus with other specified complication: Secondary | ICD-10-CM | POA: Diagnosis not present

## 2021-10-07 DIAGNOSIS — Z1331 Encounter for screening for depression: Secondary | ICD-10-CM | POA: Diagnosis not present

## 2021-10-07 DIAGNOSIS — E785 Hyperlipidemia, unspecified: Secondary | ICD-10-CM | POA: Diagnosis not present

## 2021-10-17 DIAGNOSIS — M5416 Radiculopathy, lumbar region: Secondary | ICD-10-CM | POA: Diagnosis not present

## 2021-10-31 DIAGNOSIS — M5416 Radiculopathy, lumbar region: Secondary | ICD-10-CM | POA: Diagnosis not present

## 2022-07-01 ENCOUNTER — Other Ambulatory Visit: Payer: Self-pay | Admitting: Family Medicine

## 2022-07-01 DIAGNOSIS — Z1231 Encounter for screening mammogram for malignant neoplasm of breast: Secondary | ICD-10-CM

## 2022-11-03 IMAGING — MG MM DIGITAL SCREENING BILAT W/ TOMO AND CAD
6 of 10 series · 6 of 30 positions shown · non-contrast
Comparison: Previous exam(s).

CLINICAL DATA: Screening.

EXAM:
DIGITAL SCREENING BILATERAL MAMMOGRAM WITH TOMOSYNTHESIS AND CAD
TECHNIQUE: Bilateral screening digital craniocaudal and mediolateral oblique
mammograms were obtained. Bilateral screening digital breast
tomosynthesis was performed. The images were evaluated with
computer-aided detection.

[R CC synth-2D (1 of 2)]
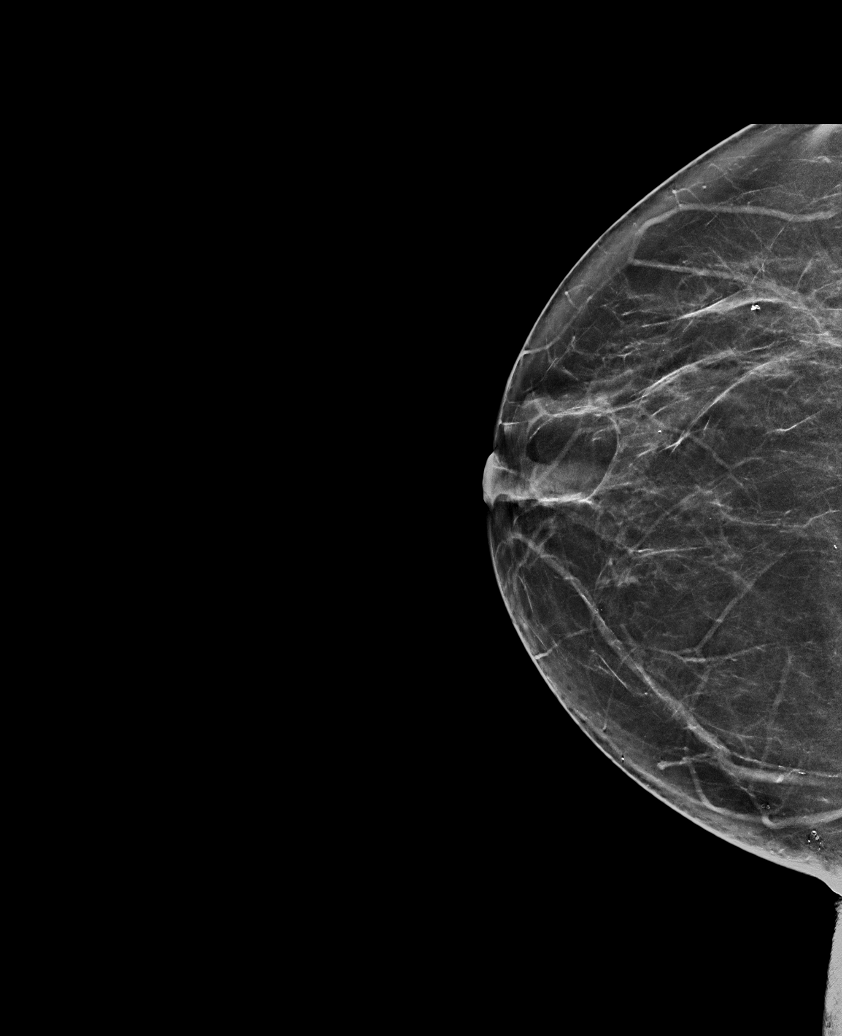

[R CC synth-2D (2 of 2)]
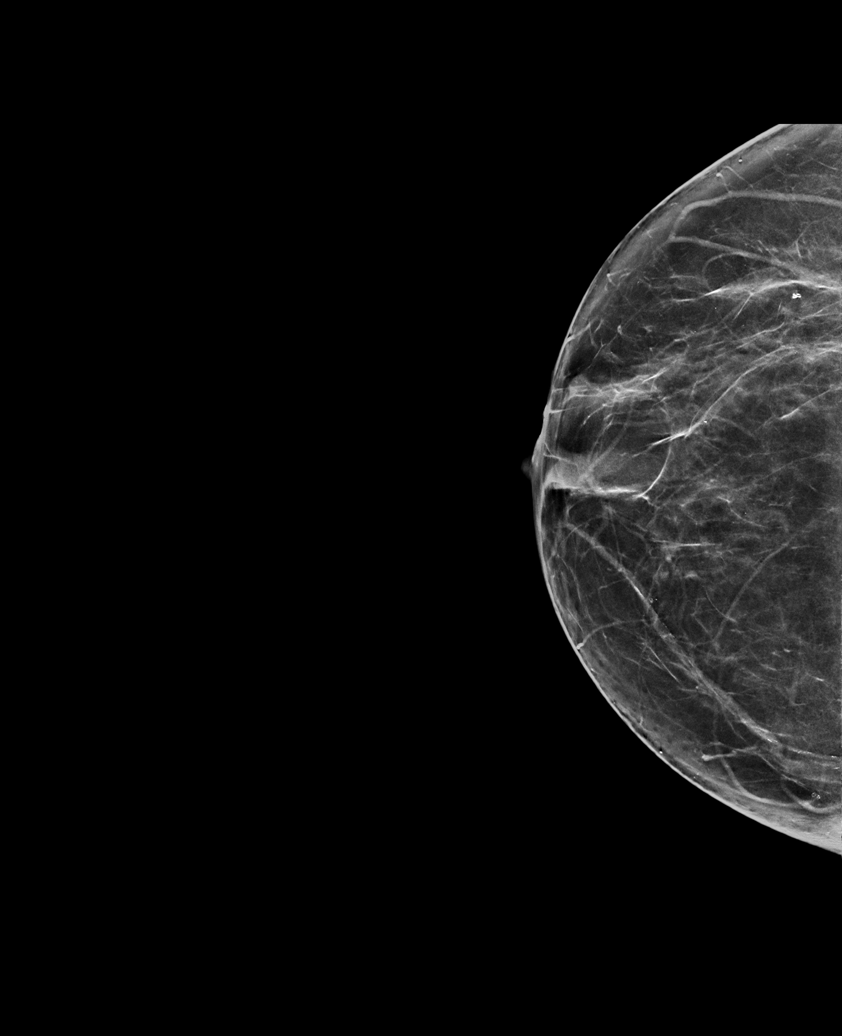

[L MLO synth-2D]
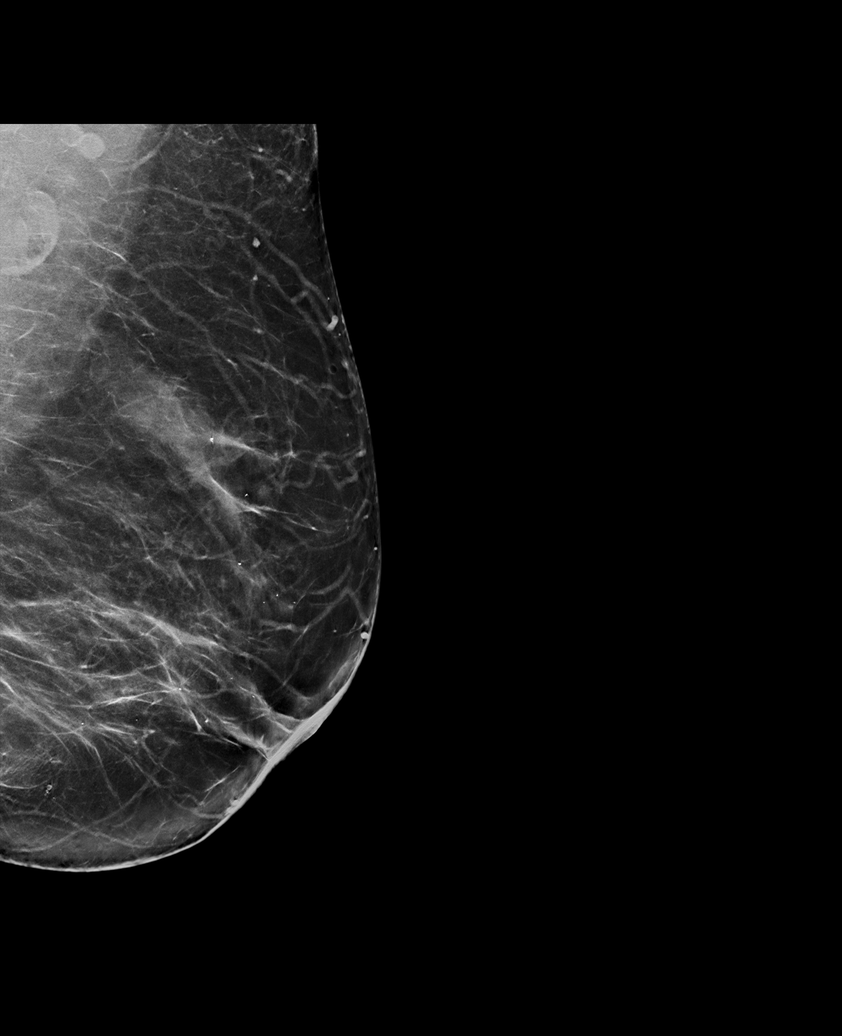

[L CC synth-2D]
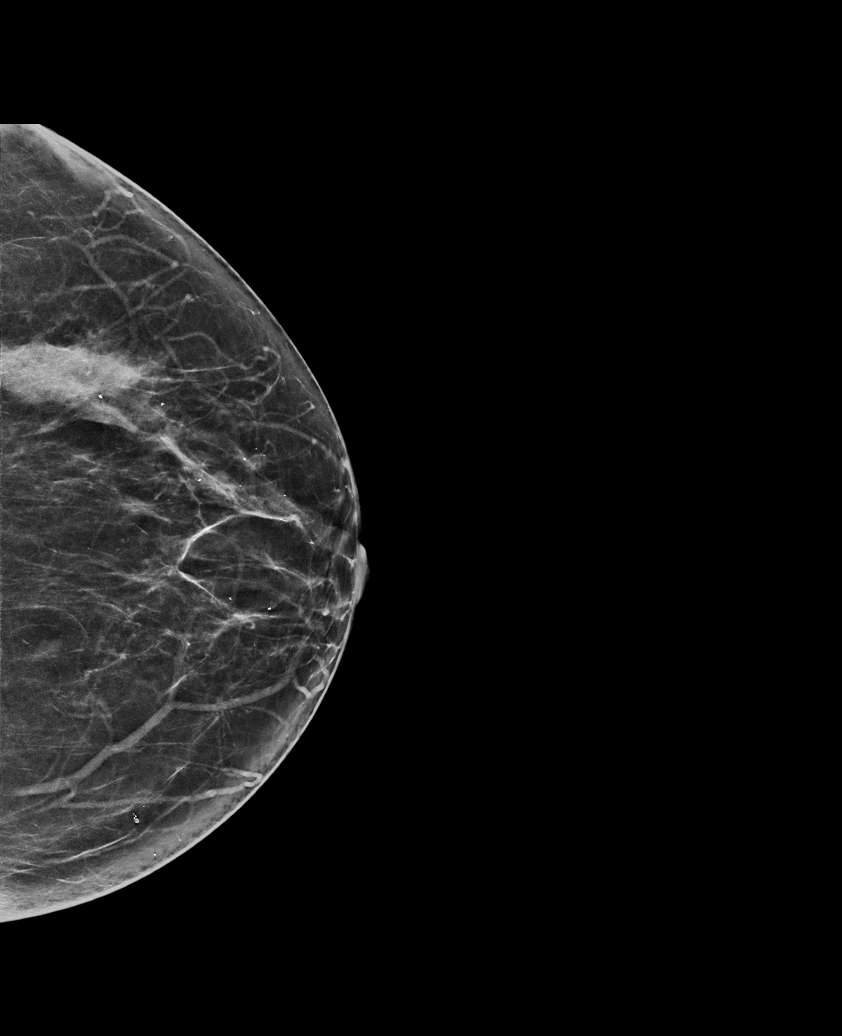

[R MLO synth-2D]
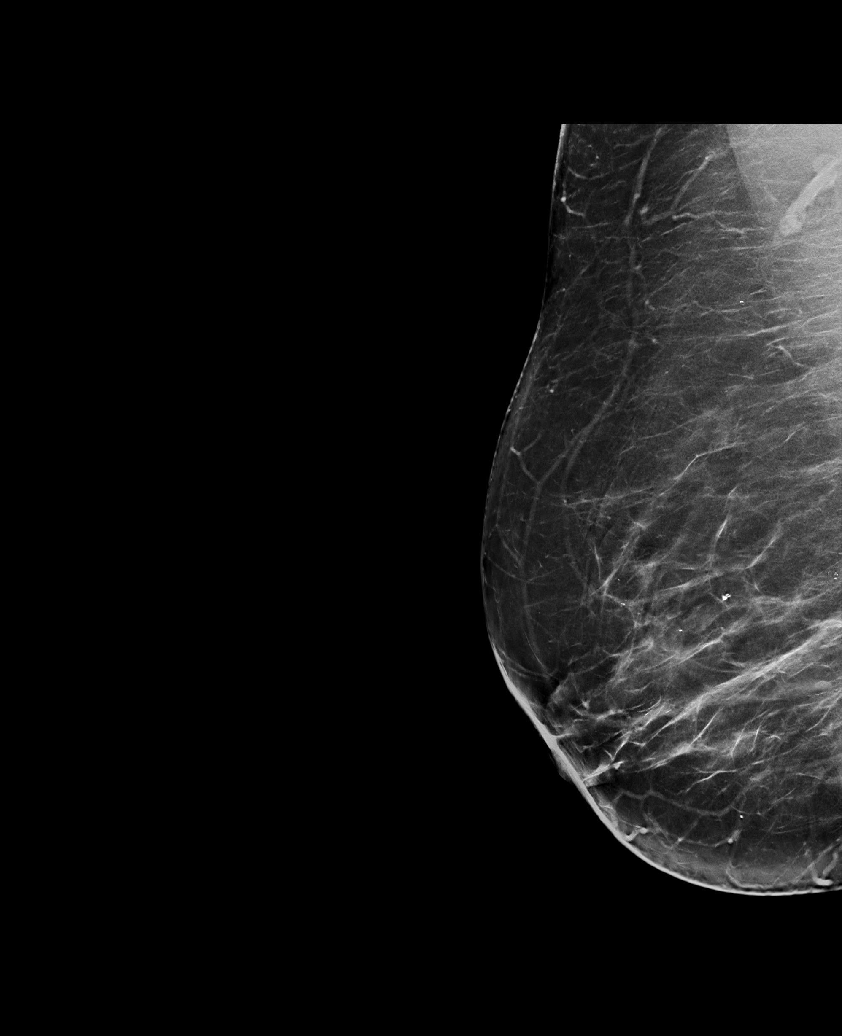

[L MLO tomo · tomo slice 42/83.0]
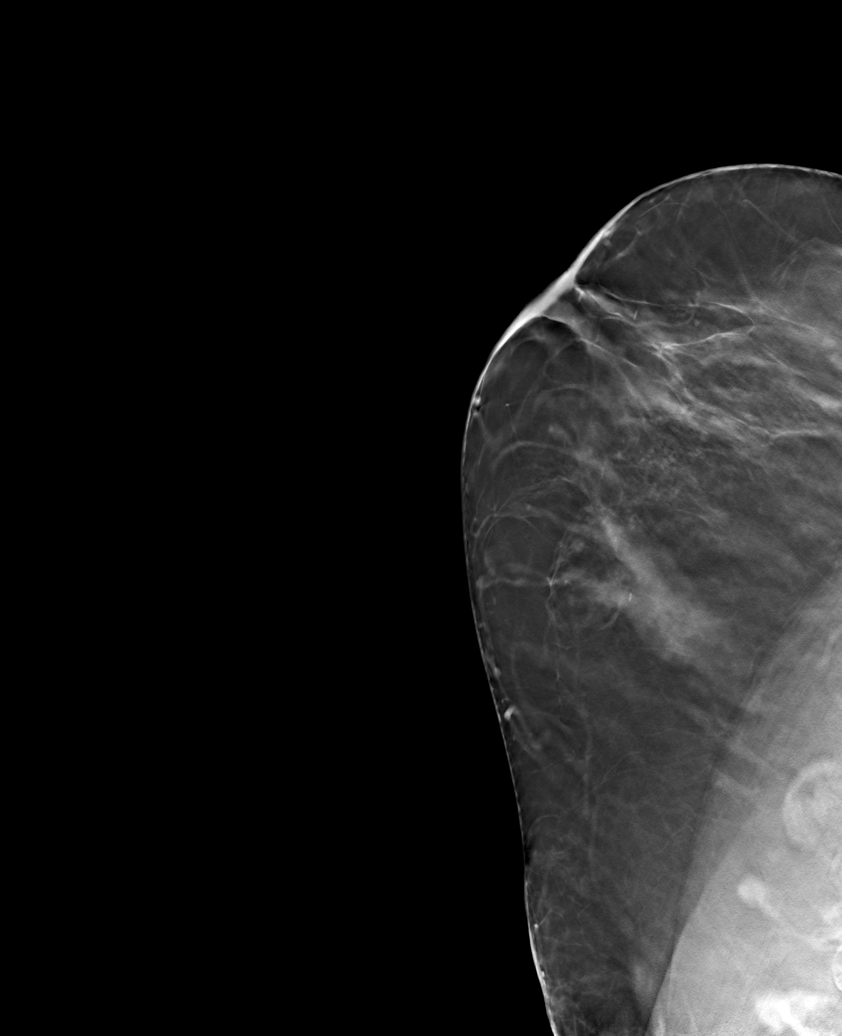

[6 of 30 positions shown; findings below may reference images not displayed]

ACR Breast Density Category b: There are scattered areas of
fibroglandular density.
FINDINGS: There are no findings suspicious for malignancy.
IMPRESSION: No mammographic evidence of malignancy. A result letter of this
screening mammogram will be mailed directly to the patient.

RECOMMENDATION:
Screening mammogram in one year. (Code:51-O-LD2)

BI-RADS CATEGORY  1: Negative.

## 2022-12-20 ENCOUNTER — Emergency Department
Admission: EM | Admit: 2022-12-20 | Discharge: 2022-12-20 | Disposition: A | Discharge status: Home / Self Care | Payer: Medicare HMO | Attending: Emergency Medicine | Admitting: Emergency Medicine

## 2022-12-20 ENCOUNTER — Other Ambulatory Visit: Payer: Self-pay

## 2022-12-20 DIAGNOSIS — I10 Essential (primary) hypertension: Secondary | ICD-10-CM | POA: Insufficient documentation

## 2022-12-20 DIAGNOSIS — R000 Tachycardia, unspecified: Secondary | ICD-10-CM | POA: Diagnosis not present

## 2022-12-20 DIAGNOSIS — R112 Nausea with vomiting, unspecified: Secondary | ICD-10-CM | POA: Diagnosis not present

## 2022-12-20 DIAGNOSIS — K529 Noninfective gastroenteritis and colitis, unspecified: Secondary | ICD-10-CM

## 2022-12-20 DIAGNOSIS — R1110 Vomiting, unspecified: Secondary | ICD-10-CM | POA: Diagnosis present

## 2022-12-20 LAB — COMPREHENSIVE METABOLIC PANEL
ALT: 20 U/L (ref 0–44)
AST: 31 U/L (ref 15–41)
Albumin: 4.6 g/dL (ref 3.5–5.0)
Alkaline Phosphatase: 61 U/L (ref 38–126)
Anion gap: 13 (ref 5–15)
BUN: 17 mg/dL (ref 8–23)
CO2: 20 mmol/L — ABNORMAL LOW (ref 22–32)
Calcium: 9.3 mg/dL (ref 8.9–10.3)
Chloride: 104 mmol/L (ref 98–111)
Creatinine, Ser: 0.74 mg/dL (ref 0.44–1.00)
GFR, Estimated: >60 mL/min (ref >60)
Glucose, Bld: 201 mg/dL — ABNORMAL HIGH (ref 70–99)
Potassium: 3 mmol/L — ABNORMAL LOW (ref 3.5–5.1)
Sodium: 137 mmol/L (ref 135–145)
Total Bilirubin: 0.7 mg/dL (ref 0.3–1.2)
Total Protein: 7.5 g/dL (ref 6.5–8.1)

## 2022-12-20 LAB — CBC WITH DIFFERENTIAL/PLATELET
Abs Immature Granulocytes: 0.02 10*3/uL (ref 0.00–0.07)
Basophils Absolute: 0 10*3/uL (ref 0.0–0.1)
Basophils Relative: 0 %
Eosinophils Absolute: 0 10*3/uL (ref 0.0–0.5)
Eosinophils Relative: 1 %
HCT: 43.8 % (ref 36.0–46.0)
Hemoglobin: 14.2 g/dL (ref 12.0–15.0)
Immature Granulocytes: 0 %
Lymphocytes Relative: 26 %
Lymphs Abs: 2.2 10*3/uL (ref 0.7–4.0)
MCH: 26.8 pg (ref 26.0–34.0)
MCHC: 32.4 g/dL (ref 30.0–36.0)
MCV: 82.8 fL (ref 80.0–100.0)
Monocytes Absolute: 0.6 10*3/uL (ref 0.1–1.0)
Monocytes Relative: 7 %
Neutro Abs: 5.7 10*3/uL (ref 1.7–7.7)
Neutrophils Relative %: 66 %
Platelets: 292 10*3/uL (ref 150–400)
RBC: 5.29 MIL/uL — ABNORMAL HIGH (ref 3.87–5.11)
RDW: 13.6 % (ref 11.5–15.5)
WBC: 8.6 10*3/uL (ref 4.0–10.5)
nRBC: 0 % (ref 0.0–0.2)

## 2022-12-20 LAB — LIPASE, BLOOD: Lipase: 31 U/L (ref 11–51)

## 2022-12-20 LAB — TROPONIN I (HIGH SENSITIVITY): Troponin I (High Sensitivity): 9 ng/L (ref <18)

## 2022-12-20 MED ORDER — ONDANSETRON 4 MG PO TBDP
4.0000 mg | ORAL_TABLET | Freq: Once | ORAL | Status: AC
  Administered 2022-12-20: 4 mg via ORAL
  Filled 2022-12-20: qty 1

## 2022-12-20 MED ORDER — ONDANSETRON 4 MG PO TBDP: 4.0000 mg | ORAL_TABLET | Freq: Three times a day (TID) | ORAL | 0 refills | Status: DC | PRN

## 2022-12-20 NOTE — ED Triage Notes (Signed)
Patient is here today for a single episode of vomiting [place] [employer] [place]; Per patient, she does not have a history of high blood pressure but she was hypertensive with EMS at 210/100 and hypertensive upon arrival here at 178/85; Does report a headache, no blurry vision at this time

## 2022-12-20 NOTE — ED Provider Notes (Signed)
[facility] Provider Note    Event Date/Time   First MD Initiated Contact with Patient 12/20/22 1652     (approximate)  History   Chief Complaint: Vomiting (Patient is here today for a single episode of vomiting while she was eating at the [employer] in the mall; Per patient, she does not have a history of high blood pressure but she was hypertensive with EMS at 210/100 and hypertensive upon arrival here at 178/85; Does report a headache, no blurry vision at this time)  HPI  Teresa [name] is a 72 y.o. female with a past medical history of anxiety, depression, hyperlipidemia presents to the emergency department for an episode of vomiting and high blood pressure.  According to the patient she was at a restaurant eating dinner when her stomach began hurting and the patient had an episode of vomiting at the restaurant.  She states EMS arrived and found her blood pressure to be over 200 so they transported her to the emergency department.  Patient denies any chest pain states mild headache initially no headache currently.  States she feels much better.  Did have a bowel movement but no diarrhea.  No recent fever cough or congestion.  No sick contacts known to her.  Physical Exam   Triage Vital Signs: ED Triage Vitals  Enc Vitals Group     BP 12/20/22 1612 (!) 178/85     Pulse Rate 12/20/22 1612 (!) 107     Resp 12/20/22 1612 20     Temp 12/20/22 1612 98.5 F (36.9 C)     Temp Source 12/20/22 1612 Oral     SpO2 12/20/22 1612 100 %     Weight 12/20/22 1610 180 lb (81.6 kg)     Height 12/20/22 1610 '5\' 4"'$  (1.626 m)     Head Circumference --      Peak Flow --      Pain Score 12/20/22 1607 0     Pain Loc --      Pain Edu? --      Excl. in [place]? --     Most recent vital signs: Vitals:   12/20/22 1612  BP: (!) 178/85  Pulse: (!) 107  Resp: 20  Temp: 98.5 F (36.9 C)  SpO2: 100%    General: Awake, no distress.  CV:  Good peripheral perfusion.  Regular  rate and rhythm  Resp:  Normal effort.  Equal breath sounds bilaterally.  Abd:  No distention.  Soft, nontender.  No rebound or guarding.  Benign abdomen  ED Results / Procedures / Treatments   EKG  EKG viewed and interpreted by myself shows sinus tachycardia 111 bpm with a narrow QRS, normal axis, normal intervals, no concerning ST changes.  MEDICATIONS ORDERED IN ED: Medications  ondansetron (ZOFRAN-ODT) disintegrating tablet 4 mg (has no administration in time range)     IMPRESSION / MDM / ASSESSMENT AND PLAN / ED COURSE  I reviewed the triage vital signs and the nursing notes.  Patient's presentation is most consistent with acute presentation with potential threat to life or bodily function.  Patient presents emergency department for episode of vomiting and found to be hypertensive by EMS.  Currently patient's blood pressure down to 160/85 during my evaluation.  Patient denies any symptoms.  Benign abdomen on exam.  Lab work is reassuring including chemistry, normal CBC with normal white blood cell count negative lipase and negative troponin.  Patient's pharmacy is now closed we will dose a  one-time dose of ODT Zofran and discharged the patient home with same.  Differential would include foodborne illness, viral gastroenteritis or norovirus.  Discussed with the patient if she develops abdominal pain, worsening nausea vomiting or fever she should return to the emergency department for evaluation.  Patient agreeable to plan of care.  FINAL CLINICAL IMPRESSION(S) / ED DIAGNOSES   Nausea vomiting   Note:  This document was prepared using Dragon voice recognition software and may include unintentional dictation errors.   Harvest Dark, MD 12/20/22 1705

## 2022-12-20 NOTE — ED Notes (Signed)
Pt asked to not be placed in the bed and would like to stay in the wheelchair. Pt informed RN that she just had a large BM after arriving here in the ER and feels better and has no abdominal pain at this time. Pt did inform RN she was at [employer] when her abdominal pain started.

## 2022-12-20 NOTE — ED Notes (Signed)
This RN roomed pt. Introduced self to pt. Pt stating "when can I leave. I am ready to leave. How long do I have to be here?" RN educated her she just got here and would now see the doctor,

## 2023-11-30 DIAGNOSIS — Z79899 Other long term (current) drug therapy: Secondary | ICD-10-CM | POA: Diagnosis not present

## 2023-11-30 DIAGNOSIS — E1169 Type 2 diabetes mellitus with other specified complication: Secondary | ICD-10-CM | POA: Diagnosis not present

## 2023-11-30 DIAGNOSIS — E785 Hyperlipidemia, unspecified: Secondary | ICD-10-CM | POA: Diagnosis not present

## 2023-12-03 DIAGNOSIS — M546 Pain in thoracic spine: Secondary | ICD-10-CM | POA: Diagnosis not present

## 2023-12-07 DIAGNOSIS — Z1331 Encounter for screening for depression: Secondary | ICD-10-CM | POA: Diagnosis not present

## 2023-12-07 DIAGNOSIS — Z79899 Other long term (current) drug therapy: Secondary | ICD-10-CM | POA: Diagnosis not present

## 2023-12-07 DIAGNOSIS — E1169 Type 2 diabetes mellitus with other specified complication: Secondary | ICD-10-CM | POA: Diagnosis not present

## 2023-12-07 DIAGNOSIS — Z0000 Encounter for general adult medical examination without abnormal findings: Secondary | ICD-10-CM | POA: Diagnosis not present

## 2023-12-07 DIAGNOSIS — I10 Essential (primary) hypertension: Secondary | ICD-10-CM | POA: Diagnosis not present

## 2023-12-07 DIAGNOSIS — E785 Hyperlipidemia, unspecified: Secondary | ICD-10-CM | POA: Diagnosis not present

## 2023-12-28 DIAGNOSIS — I10 Essential (primary) hypertension: Secondary | ICD-10-CM | POA: Diagnosis not present

## 2023-12-28 DIAGNOSIS — Z008 Encounter for other general examination: Secondary | ICD-10-CM | POA: Diagnosis not present

## 2023-12-28 DIAGNOSIS — E1169 Type 2 diabetes mellitus with other specified complication: Secondary | ICD-10-CM | POA: Diagnosis not present

## 2023-12-28 DIAGNOSIS — Z6827 Body mass index (BMI) 27.0-27.9, adult: Secondary | ICD-10-CM | POA: Diagnosis not present

## 2023-12-28 DIAGNOSIS — E785 Hyperlipidemia, unspecified: Secondary | ICD-10-CM | POA: Diagnosis not present

## 2023-12-28 DIAGNOSIS — R2681 Unsteadiness on feet: Secondary | ICD-10-CM | POA: Diagnosis not present

## 2023-12-28 DIAGNOSIS — E663 Overweight: Secondary | ICD-10-CM | POA: Diagnosis not present

## 2024-02-01 DIAGNOSIS — M25561 Pain in right knee: Secondary | ICD-10-CM | POA: Diagnosis not present

## 2024-02-10 DIAGNOSIS — M25561 Pain in right knee: Secondary | ICD-10-CM | POA: Diagnosis not present

## 2024-05-01 DIAGNOSIS — M25562 Pain in left knee: Secondary | ICD-10-CM | POA: Diagnosis not present

## 2024-05-13 ENCOUNTER — Other Ambulatory Visit: Payer: Self-pay

## 2024-05-13 ENCOUNTER — Emergency Department

## 2024-05-13 ENCOUNTER — Inpatient Hospital Stay
Admission: EM | Admit: 2024-05-13 | Discharge: 2024-05-15 | DRG: 071 | Disposition: A | Discharge status: Home Health | Attending: Hospitalist | Admitting: Hospitalist

## 2024-05-13 DIAGNOSIS — I10 Essential (primary) hypertension: Secondary | ICD-10-CM | POA: Diagnosis not present

## 2024-05-13 DIAGNOSIS — E1142 Type 2 diabetes mellitus with diabetic polyneuropathy: Secondary | ICD-10-CM | POA: Diagnosis present

## 2024-05-13 DIAGNOSIS — R509 Fever, unspecified: Secondary | ICD-10-CM | POA: Diagnosis not present

## 2024-05-13 DIAGNOSIS — M25531 Pain in right wrist: Secondary | ICD-10-CM | POA: Diagnosis not present

## 2024-05-13 DIAGNOSIS — Z79899 Other long term (current) drug therapy: Secondary | ICD-10-CM | POA: Diagnosis not present

## 2024-05-13 DIAGNOSIS — Z90710 Acquired absence of both cervix and uterus: Secondary | ICD-10-CM

## 2024-05-13 DIAGNOSIS — E538 Deficiency of other specified B group vitamins: Secondary | ICD-10-CM

## 2024-05-13 DIAGNOSIS — R350 Frequency of micturition: Secondary | ICD-10-CM | POA: Diagnosis not present

## 2024-05-13 DIAGNOSIS — R0689 Other abnormalities of breathing: Secondary | ICD-10-CM | POA: Diagnosis not present

## 2024-05-13 DIAGNOSIS — N649 Disorder of breast, unspecified: Secondary | ICD-10-CM | POA: Diagnosis present

## 2024-05-13 DIAGNOSIS — M85841 Other specified disorders of bone density and structure, right hand: Secondary | ICD-10-CM | POA: Diagnosis not present

## 2024-05-13 DIAGNOSIS — I6523 Occlusion and stenosis of bilateral carotid arteries: Secondary | ICD-10-CM | POA: Diagnosis not present

## 2024-05-13 DIAGNOSIS — R531 Weakness: Secondary | ICD-10-CM | POA: Diagnosis not present

## 2024-05-13 DIAGNOSIS — D72829 Elevated white blood cell count, unspecified: Secondary | ICD-10-CM | POA: Diagnosis not present

## 2024-05-13 DIAGNOSIS — Z1152 Encounter for screening for COVID-19: Secondary | ICD-10-CM

## 2024-05-13 DIAGNOSIS — G9340 Encephalopathy, unspecified: Secondary | ICD-10-CM | POA: Diagnosis not present

## 2024-05-13 DIAGNOSIS — A419 Sepsis, unspecified organism: Secondary | ICD-10-CM | POA: Diagnosis not present

## 2024-05-13 DIAGNOSIS — R6510 Systemic inflammatory response syndrome (SIRS) of non-infectious origin without acute organ dysfunction: Secondary | ICD-10-CM | POA: Diagnosis present

## 2024-05-13 DIAGNOSIS — Z886 Allergy status to analgesic agent status: Secondary | ICD-10-CM

## 2024-05-13 DIAGNOSIS — M79641 Pain in right hand: Secondary | ICD-10-CM | POA: Diagnosis not present

## 2024-05-13 DIAGNOSIS — Z0389 Encounter for observation for other suspected diseases and conditions ruled out: Secondary | ICD-10-CM | POA: Diagnosis not present

## 2024-05-13 DIAGNOSIS — R410 Disorientation, unspecified: Secondary | ICD-10-CM | POA: Diagnosis not present

## 2024-05-13 DIAGNOSIS — M19031 Primary osteoarthritis, right wrist: Secondary | ICD-10-CM | POA: Diagnosis not present

## 2024-05-13 DIAGNOSIS — E785 Hyperlipidemia, unspecified: Secondary | ICD-10-CM | POA: Diagnosis present

## 2024-05-13 DIAGNOSIS — R000 Tachycardia, unspecified: Secondary | ICD-10-CM | POA: Diagnosis not present

## 2024-05-13 DIAGNOSIS — I700 Atherosclerosis of aorta: Secondary | ICD-10-CM | POA: Diagnosis not present

## 2024-05-13 HISTORY — DX: Deficiency of other specified B group vitamins: E53.8

## 2024-05-13 HISTORY — DX: Systemic inflammatory response syndrome (sirs) of non-infectious origin without acute organ dysfunction: R65.10

## 2024-05-13 HISTORY — DX: Type 2 diabetes mellitus with diabetic polyneuropathy: E11.42

## 2024-05-13 HISTORY — DX: Encephalopathy, unspecified: G93.40

## 2024-05-13 LAB — CBC WITH DIFFERENTIAL/PLATELET
Abs Immature Granulocytes: 0.1 K/uL — ABNORMAL HIGH (ref 0.00–0.07)
Basophils Absolute: 0 K/uL (ref 0.0–0.1)
Basophils Relative: 0 %
Eosinophils Absolute: 0 K/uL (ref 0.0–0.5)
Eosinophils Relative: 0 %
HCT: 42.1 % (ref 36.0–46.0)
Hemoglobin: 13.8 g/dL (ref 12.0–15.0)
Immature Granulocytes: 1 %
Lymphocytes Relative: 16 %
Lymphs Abs: 2.8 K/uL (ref 0.7–4.0)
MCH: 26.4 pg (ref 26.0–34.0)
MCHC: 32.8 g/dL (ref 30.0–36.0)
MCV: 80.7 fL (ref 80.0–100.0)
Monocytes Absolute: 1.8 K/uL — ABNORMAL HIGH (ref 0.1–1.0)
Monocytes Relative: 10 %
Neutro Abs: 12.7 K/uL — ABNORMAL HIGH (ref 1.7–7.7)
Neutrophils Relative %: 73 %
Platelets: 321 K/uL (ref 150–400)
RBC: 5.22 MIL/uL — ABNORMAL HIGH (ref 3.87–5.11)
RDW: 13.7 % (ref 11.5–15.5)
WBC: 17.5 K/uL — ABNORMAL HIGH (ref 4.0–10.5)
nRBC: 0 % (ref 0.0–0.2)

## 2024-05-13 LAB — URINALYSIS, ROUTINE W REFLEX MICROSCOPIC
Bilirubin Urine: NEGATIVE
Glucose, UA: NEGATIVE mg/dL
Hgb urine dipstick: NEGATIVE
Ketones, ur: NEGATIVE mg/dL
Leukocytes,Ua: NEGATIVE
Nitrite: NEGATIVE
Protein, ur: NEGATIVE mg/dL
Specific Gravity, Urine: 1.019 (ref 1.005–1.030)
pH: 6 (ref 5.0–8.0)

## 2024-05-13 LAB — COMPREHENSIVE METABOLIC PANEL WITH GFR
ALT: 28 U/L (ref 0–44)
AST: 36 U/L (ref 15–41)
Albumin: 3.8 g/dL (ref 3.5–5.0)
Alkaline Phosphatase: 50 U/L (ref 38–126)
Anion gap: 12 (ref 5–15)
BUN: 17 mg/dL (ref 8–23)
CO2: 23 mmol/L (ref 22–32)
Calcium: 9.2 mg/dL (ref 8.9–10.3)
Chloride: 103 mmol/L (ref 98–111)
Creatinine, Ser: 0.79 mg/dL (ref 0.44–1.00)
GFR, Estimated: >60 mL/min (ref >60)
Glucose, Bld: 139 mg/dL — ABNORMAL HIGH (ref 70–99)
Potassium: 4.4 mmol/L (ref 3.5–5.1)
Sodium: 138 mmol/L (ref 135–145)
Total Bilirubin: 1.3 mg/dL — ABNORMAL HIGH (ref 0.0–1.2)
Total Protein: 7.5 g/dL (ref 6.5–8.1)

## 2024-05-13 LAB — LACTIC ACID, PLASMA
Lactic Acid, Venous: 1.6 mmol/L (ref 0.5–1.9)
Lactic Acid, Venous: 1.4 mmol/L (ref 0.5–1.9)

## 2024-05-13 LAB — PROCALCITONIN: Procalcitonin: <0.1 ng/mL

## 2024-05-13 LAB — RESP PANEL BY RT-PCR (RSV, FLU A&B, COVID)  RVPGX2
Influenza A by PCR: NEGATIVE
Influenza B by PCR: NEGATIVE
Resp Syncytial Virus by PCR: NEGATIVE
SARS Coronavirus 2 by RT PCR: NEGATIVE

## 2024-05-13 LAB — PROTIME-INR
INR: 1.1 (ref 0.8–1.2)
Prothrombin Time: 14.3 s (ref 11.4–15.2)

## 2024-05-13 MED ORDER — SODIUM CHLORIDE 0.9 % IV SOLN
2.0000 g | Freq: Three times a day (TID) | INTRAVENOUS | Status: DC
  Administered 2024-05-14: 2 g via INTRAVENOUS
  Filled 2024-05-13: qty 12.5

## 2024-05-13 MED ORDER — IOHEXOL 300 MG/ML  SOLN
100.0000 mL | Freq: Once | INTRAMUSCULAR | Status: AC | PRN
  Administered 2024-05-13: 100 mL via INTRAVENOUS

## 2024-05-13 MED ORDER — LACTATED RINGERS IV SOLN
150.0000 mL/h | INTRAVENOUS | Status: DC
  Administered 2024-05-13 – 2024-05-14 (×2): 150 mL/h via INTRAVENOUS

## 2024-05-13 MED ORDER — LACTATED RINGERS IV BOLUS
1000.0000 mL | Freq: Once | INTRAVENOUS | Status: AC
  Administered 2024-05-13: 1000 mL via INTRAVENOUS

## 2024-05-13 MED ORDER — VANCOMYCIN HCL 2000 MG/400ML IV SOLN
2000.0000 mg | Freq: Once | INTRAVENOUS | Status: AC
  Administered 2024-05-13: 2000 mg via INTRAVENOUS
  Filled 2024-05-13: qty 400

## 2024-05-13 MED ORDER — ENOXAPARIN SODIUM 40 MG/0.4ML IJ SOSY
40.0000 mg | PREFILLED_SYRINGE | INTRAMUSCULAR | Status: DC
  Administered 2024-05-13 – 2024-05-14 (×2): 40 mg via SUBCUTANEOUS
  Filled 2024-05-13 (×2): qty 0.4

## 2024-05-13 MED ORDER — ACETAMINOPHEN 650 MG RE SUPP: 650.0000 mg | Freq: Four times a day (QID) | RECTAL | Status: DC | PRN

## 2024-05-13 MED ORDER — SODIUM CHLORIDE 0.9 % IV SOLN: 2.0000 g | Freq: Once | INTRAVENOUS | Status: DC

## 2024-05-13 MED ORDER — LORATADINE 10 MG PO TABS
10.0000 mg | ORAL_TABLET | Freq: Every day | ORAL | Status: DC
  Administered 2024-05-15: 10 mg via ORAL
  Filled 2024-05-13 (×2): qty 1

## 2024-05-13 MED ORDER — ACETAMINOPHEN 325 MG PO TABS
650.0000 mg | ORAL_TABLET | Freq: Four times a day (QID) | ORAL | Status: DC | PRN
  Administered 2024-05-14: 650 mg via ORAL
  Filled 2024-05-13: qty 2

## 2024-05-13 MED ORDER — ACETAMINOPHEN 500 MG PO TABS
1000.0000 mg | ORAL_TABLET | Freq: Once | ORAL | Status: AC
  Administered 2024-05-13: 1000 mg via ORAL
  Filled 2024-05-13: qty 2

## 2024-05-13 MED ORDER — VENLAFAXINE HCL ER 75 MG PO CP24
150.0000 mg | ORAL_CAPSULE | Freq: Every day | ORAL | Status: DC
  Administered 2024-05-13: 150 mg via ORAL
  Filled 2024-05-13: qty 2
  Filled 2024-05-13: qty 1
  Filled 2024-05-13: qty 2

## 2024-05-13 MED ORDER — TRAZODONE HCL 50 MG PO TABS
25.0000 mg | ORAL_TABLET | Freq: Every evening | ORAL | Status: DC | PRN
Start: 2024-05-13 — End: 2024-05-15

## 2024-05-13 MED ORDER — GABAPENTIN 300 MG PO CAPS
300.0000 mg | ORAL_CAPSULE | Freq: Every day | ORAL | Status: DC
  Filled 2024-05-13: qty 1

## 2024-05-13 MED ORDER — VANCOMYCIN HCL 1500 MG/300ML IV SOLN: 1500.0000 mg | INTRAVENOUS | Status: DC

## 2024-05-13 MED ORDER — SIMVASTATIN 10 MG PO TABS: 10.0000 mg | ORAL_TABLET | Freq: Every day | ORAL | Status: DC

## 2024-05-13 MED ORDER — SIMVASTATIN 20 MG PO TABS
10.0000 mg | ORAL_TABLET | Freq: Every day | ORAL | Status: DC
  Administered 2024-05-13 – 2024-05-14 (×2): 10 mg via ORAL
  Filled 2024-05-13 (×2): qty 1

## 2024-05-13 MED ORDER — METRONIDAZOLE 500 MG/100ML IV SOLN
500.0000 mg | Freq: Two times a day (BID) | INTRAVENOUS | Status: DC
  Administered 2024-05-13: 500 mg via INTRAVENOUS
  Filled 2024-05-13 (×2): qty 100

## 2024-05-13 MED ORDER — SODIUM CHLORIDE 0.9 % IV SOLN
2.0000 g | Freq: Once | INTRAVENOUS | Status: AC
  Administered 2024-05-13: 2 g via INTRAVENOUS
  Filled 2024-05-13: qty 12.5

## 2024-05-13 MED ORDER — ONDANSETRON HCL 4 MG PO TABS: 4.0000 mg | ORAL_TABLET | Freq: Four times a day (QID) | ORAL | Status: DC | PRN

## 2024-05-13 MED ORDER — ONDANSETRON HCL 4 MG/2ML IJ SOLN: 4.0000 mg | Freq: Four times a day (QID) | INTRAMUSCULAR | Status: DC | PRN

## 2024-05-13 MED ORDER — VANCOMYCIN HCL IN DEXTROSE 1-5 GM/200ML-% IV SOLN: 1000.0000 mg | Freq: Once | INTRAVENOUS | Status: DC

## 2024-05-13 MED ORDER — ATENOLOL 25 MG PO TABS
25.0000 mg | ORAL_TABLET | Freq: Every day | ORAL | Status: DC
  Administered 2024-05-13: 25 mg via ORAL
  Filled 2024-05-13 (×4): qty 1

## 2024-05-13 MED ORDER — MAGNESIUM HYDROXIDE 400 MG/5ML PO SUSP: 30.0000 mL | Freq: Every day | ORAL | Status: DC | PRN

## 2024-05-13 NOTE — Assessment & Plan Note (Addendum)
-   This is of unclear etiology.  Polypharmacy and infectious etiology would be in the differential diagnoses. - The patient will be admitted to medical telemetry bed. - Will follow neurochecks for 4 hours for 24 hours. - Given SIRS we will cover with IV antibiotics as mentioned below. - Will follow blood cultures. - Given her generalized weakness, will obtain a PT consult.

## 2024-05-13 NOTE — ED Triage Notes (Signed)
 Patient coming home. Family reports lethargic and altered mental status. No last known well. Patient fell about a week ago affecting the right arm. The right arm is currently wrapped for improved pain relief.   Patient AOX2  T: 103.0. 18G LAC. CBG: 101

## 2024-05-13 NOTE — Assessment & Plan Note (Signed)
 -  We will continue statin therapy.

## 2024-05-13 NOTE — ED Notes (Signed)
 This RN attempted for 2nd IV access. Unsuccessful 2x.

## 2024-05-13 NOTE — Progress Notes (Signed)
 Pharmacy Antibiotic Note  Farrin TRINE FREAD is a 73 y.o. female admitted on 05/13/2024 with Unknown source of infection.  Pharmacy has been consulted for Cefepime  and Vancomycin  dosing.  Plan: Vancomycin  2000 mg IV loading dose Vancomycin  1500 mg IV Q 24 hrs. Goal AUC 400-550. Expected AUC: 528.9 SCr used: 0.8 Expected Cmin: 12.2  Cefepime  2g IV q8h   Height: 5' 4 (162.6 cm) Weight: 77.8 kg (171 lb 8.3 oz) IBW/kg (Calculated) : 54.7  Temp (24hrs), Avg:99.4 F (37.4 C), Min:98.7 F (37.1 C), Max:100.1 F (37.8 C)  Recent Labs  Lab 05/13/24 1716 05/13/24 1717  WBC 17.5*  --   CREATININE 0.79  --   LATICACIDVEN  --  1.6    Estimated Creatinine Clearance: 65.1 mL/min (by C-G formula based on SCr of 0.79 mg/dL).    Allergies  Allergen Reactions   Aleve [Naproxen Sodium]     Facial swelling   Aspirin     REACTION: nausea    Antimicrobials this admission: Cefepime  7/19 >>  Vancomycin  7/19 >>   Dose adjustments this admission:  Microbiology results:  Thank you for allowing pharmacy to be a part of this patient's care.  Olam Fritter, PharmD, BCPS 05/13/2024 9:18 PM

## 2024-05-13 NOTE — Assessment & Plan Note (Signed)
 Continue vitamin B12

## 2024-05-13 NOTE — Assessment & Plan Note (Signed)
-   Will empirically continue IV vancomycin , cefepime  and Flagyl . - Will continue hydration with IV lactated Ringer . - We will follow blood cultures. - His UA and chest x-ray came back negative.

## 2024-05-13 NOTE — ED Notes (Signed)
 Notified Mansy MD of pt and family request for HS medications.

## 2024-05-13 NOTE — ED Provider Notes (Signed)
 [facility] Provider Note    Event Date/Time   First MD Initiated Contact with Patient 05/13/24 1714     (approximate)   History   Altered Mental Status   HPI  Teresa [name] is a 73 y.o. female who presents to the ED for evaluation of Altered Mental Status   Reviewed PCP visit from 12 days ago.  Seen for acute left knee pain without particular injury, prescribed prednisone and baclofen.  Some notes of progressive confusion at that point as well.   Patient presents to the ED from home via EMS for evaluation of confusion.  History is limited from the patient as she is encephalopathic and disoriented.  Reports right hand and wrist pain.  EMS reports she had a fall recently, patient presents with her right hand and wrist wrapped loosely in an Ace wrap.  Majority of history is provided by patient's daughter who arrived shortly after.  She reports patient today is acutely altered from her baseline cognition, weak, cannot walk.   Physical Exam   Triage Vital Signs: ED Triage Vitals  Encounter Vitals Group     BP --      Girls Systolic BP Percentile --      Girls Diastolic BP Percentile --      Boys Systolic BP Percentile --      Boys Diastolic BP Percentile --      Pulse --      Resp --      Temp 05/13/24 1713 100.1 F (37.8 C)     Temp Source 05/13/24 1713 Oral     SpO2 --      Weight 05/13/24 1714 171 lb 8.3 oz (77.8 kg)     Height 05/13/24 1714 5' 4 (1.626 m)     Head Circumference --      Peak Flow --      Pain Score 05/13/24 1713 2     Pain Loc --      Pain Education --      Exclude from Growth Chart --     Most recent vital signs: Vitals:   05/13/24 1815 05/13/24 1901  BP:    Pulse:    Resp:    Temp:  98.7 F (37.1 C)  SpO2: 100%     General: Awake, no distress.  Warm to the touch, disoriented. CV:  Good peripheral perfusion.  Tachycardic and regular Resp:  Mild tachypnea without distress or wheezing Abd:  No distention.   Mild suprapubic tenderness but generally soft and benign MSK:  No deformity noted.  Some mild tenderness throughout the metacarpals and wrist on the right hand without gross deformity.  Palpation of all 4 extremities otherwise without evidence of deformity, tenderness or trauma Neuro:  No focal deficits appreciated.  No meningeal features Other:     ED Results / Procedures / Treatments   Labs (all labs ordered are listed, but only abnormal results are displayed) Labs Reviewed  COMPREHENSIVE METABOLIC PANEL WITH GFR - Abnormal; Notable for the following components:      Result Value   Glucose, Bld 139 (*)    Total Bilirubin 1.3 (*)    All other components within normal limits  CBC WITH DIFFERENTIAL/PLATELET - Abnormal; Notable for the following components:   WBC 17.5 (*)    RBC 5.22 (*)    Neutro Abs 12.7 (*)    Monocytes Absolute 1.8 (*)    Abs Immature Granulocytes 0.10 (*)    All other  components within normal limits  URINALYSIS, ROUTINE W REFLEX MICROSCOPIC - Abnormal; Notable for the following components:   Color, Urine YELLOW (*)    APPearance CLOUDY (*)    All other components within normal limits  RESP PANEL BY RT-PCR (RSV, FLU A&B, COVID)  RVPGX2  CULTURE, BLOOD (ROUTINE X 2)  CULTURE, BLOOD (ROUTINE X 2)  URINE CULTURE  LACTIC ACID, PLASMA  PROTIME-INR  PROCALCITONIN  LACTIC ACID, PLASMA    EKG Sinus tachycardia with a rate of 109 bpm.  Normal axis and intervals without clear signs of acute ischemia.  RADIOLOGY CXR interpreted by me without evidence of acute cardiopulmonary pathology. Plain film of the right hand and wrist interpreted by me without evidence of fracture or dislocation CT head interpreted by me without evidence of acute intracranial pathology CT chest, abdomen and pelvis interpreted by me without evidence of pneumonia or intra-abdominal pathology.  Official radiology report(s): CT CHEST ABDOMEN PELVIS W CONTRAST Result Date: 05/13/2024 EXAM: CT  CHEST, ABDOMEN AND PELVIS WITH CONTRAST 05/13/2024 06:51:06 PM TECHNIQUE: CT of the chest, abdomen and pelvis was performed with the administration of intravenous contrast. Multiplanar reformatted images are provided for review. Automated exposure control, iterative reconstruction, and/or weight based adjustment of the mA/kV was utilized to reduce the radiation dose to as low as reasonably achievable. COMPARISON: None available. CLINICAL HISTORY: Sepsis, altered, no clear source. Patient coming home. Family reports lethargic and altered mental status. No last known well. Patient fell about a week ago affecting the right arm. The right arm is currently wrapped for improved pain relief. FINDINGS: CHEST: MEDIASTINUM: Heart and pericardium are unremarkable. The central airways are clear. THORACIC LYMPH NODES: No mediastinal, hilar or axillary lymphadenopathy. LUNGS AND PLEURA: No focal consolidation or pulmonary edema. No pleural effusion or pneumothorax. ABDOMEN AND PELVIS: LIVER: The liver is unremarkable. GALLBLADDER AND BILE DUCTS: Gallbladder is unremarkable. No biliary ductal dilatation. SPLEEN: No acute abnormality. PANCREAS: No acute abnormality. ADRENAL GLANDS: No acute abnormality. KIDNEYS, URETERS AND BLADDER: No stones in the kidneys or ureters. No hydronephrosis. No perinephric or periureteral stranding. Urinary bladder is unremarkable. GI AND BOWEL: Stomach demonstrates no acute abnormality. There is no bowel obstruction. Mild sigmoid diverticulosis, without evidence of diverticulitis. Normal appendix. REPRODUCTIVE ORGANS: Status post hysterectomy. PERITONEUM AND RETROPERITONEUM: No ascites. No free air. VASCULATURE: Atherosclerotic calcifications of the abdominal aorta and branch vessels, although patent. ABDOMINAL AND PELVIS LYMPH NODES: No lymphadenopathy. REPRODUCTIVE ORGANS: No acute abnormality. BONES AND SOFT TISSUES: Degenerative changes of the visualized thoracolumbar spine with posterior fusion  at T10-12. 3.3 x 2.4 x 3.1 cm lesion in the upper outer left breast. IMPRESSION: 1. 3.3 x 2.4 x 3.1 cm lesion in the upper outer left breast. Follow-up mammographic correlation is suggested. 2. No acute findings in the chest, abdomen, or pelvis. Electronically signed by: Pinkie Pebbles MD 05/13/2024 07:25 PM EDT RP Workstation: [id number]   CT HEAD WO CONTRAST ( ) Result Date: 05/13/2024 CLINICAL DATA:  nonfocal encephalopathy EXAM: CT HEAD WITHOUT CONTRAST TECHNIQUE: Contiguous axial images were obtained from the base of the skull through the vertex without intravenous contrast. RADIATION DOSE REDUCTION: This exam was performed according to the departmental dose-optimization program which includes automated exposure control, adjustment of the mA and/or kV according to patient size and/or use of iterative reconstruction technique. COMPARISON:  None Available. FINDINGS: Brain: Patchy and confluent areas of decreased attenuation are noted throughout the deep and periventricular white matter of the cerebral hemispheres bilaterally, compatible with chronic microvascular ischemic disease. No evidence  of large-territorial acute infarction. No parenchymal hemorrhage. No mass lesion. No extra-axial collection. No mass effect or midline shift. No hydrocephalus. Basilar cisterns are patent. Vascular: No hyperdense vessel. Atherosclerotic calcifications are present within the cavernous internal carotid arteries. Skull: No acute fracture or focal lesion. Sinuses/Orbits: Paranasal sinuses and mastoid air cells are clear. The orbits are unremarkable. Other: None. IMPRESSION: No acute intracranial abnormality. Electronically Signed   By: Morgane  Naveau M.D.   On: 05/13/2024 19:19   DG Hand Complete Right Result Date: 05/13/2024 CLINICAL DATA:  Fall 1 week ago with pain.  Altered mental status. EXAM: RIGHT WRIST - COMPLETE 4 VIEW; RIGHT HAND - COMPLETE 3 VIEW COMPARISON:  None Available. FINDINGS: Osteopenia. There is a  well corticated density adjacent to the ulnar styloid with possible donor site. This could be sequela however of old trauma due to the appearance. Please correlate to point tenderness. Otherwise no acute fracture or dislocation. Preserved joint spaces overall. Only slight joint space loss of the distal interphalangeal joint of the fourth and fifth digits. If there is persistent pain or further concern of a scaphoid injury recommend treatment with follow-up imaging in 7-10 days to assess for occult abnormality. IMPRESSION: Osteopenia with slight degenerative change. Small well corticated density adjacent to the ulnar styloid. Based on appearance favor a chronic process such as old trauma or accessory ossicle. Please correlate to point tenderness. Electronically Signed   By: Ranell Bring M.D.   On: 05/13/2024 18:48   DG Wrist Complete Right Result Date: 05/13/2024 CLINICAL DATA:  Fall 1 week ago with pain.  Altered mental status. EXAM: RIGHT WRIST - COMPLETE 4 VIEW; RIGHT HAND - COMPLETE 3 VIEW COMPARISON:  None Available. FINDINGS: Osteopenia. There is a well corticated density adjacent to the ulnar styloid with possible donor site. This could be sequela however of old trauma due to the appearance. Please correlate to point tenderness. Otherwise no acute fracture or dislocation. Preserved joint spaces overall. Only slight joint space loss of the distal interphalangeal joint of the fourth and fifth digits. If there is persistent pain or further concern of a scaphoid injury recommend treatment with follow-up imaging in 7-10 days to assess for occult abnormality. IMPRESSION: Osteopenia with slight degenerative change. Small well corticated density adjacent to the ulnar styloid. Based on appearance favor a chronic process such as old trauma or accessory ossicle. Please correlate to point tenderness. Electronically Signed   By: Ranell Bring M.D.   On: 05/13/2024 18:48   DG Chest Port 1 View Result Date:  05/13/2024 CLINICAL DATA:  Questionable sepsis. EXAM: PORTABLE CHEST 1 VIEW COMPARISON:  None Available. FINDINGS: No consolidation, pneumothorax or effusion. No edema. Normal cardiopericardial silhouette. Calcified aorta. Overlapping cardiac leads. Fixation hardware along the lower thoracic spine. IMPRESSION: No acute cardiopulmonary disease. Electronically Signed   By: Ranell Bring M.D.   On: 05/13/2024 18:44    PROCEDURES and INTERVENTIONS:  .1-3 Lead EKG Interpretation  Performed by: Claudene Rover, MD Authorized by: Claudene Rover, MD     Interpretation: abnormal     ECG rate:  104   ECG rate assessment: tachycardic     Rhythm: sinus tachycardia     Ectopy: none     Conduction: normal   .Critical Care  Performed by: Claudene Rover, MD Authorized by: Claudene Rover, MD   Critical care provider statement:    Critical care time (minutes):  30   Critical care time was exclusive of:  Separately billable procedures and treating other patients  Critical care was necessary to treat or prevent imminent or life-threatening deterioration of the following conditions:  Sepsis   Critical care was time spent personally by me on the following activities:  Development of treatment plan with patient or surrogate, discussions with consultants, evaluation of patient's response to treatment, examination of patient, ordering and review of laboratory studies, ordering and review of radiographic studies, ordering and performing treatments and interventions, pulse oximetry, re-evaluation of patient's condition and review of old charts   Medications  vancomycin  (VANCOREADY) IVPB 2000 mg/400 mL (2,000 mg Intravenous New Bag/Given 05/13/24 2001)  lactated ringers  bolus 1,000 mL (0 mLs Intravenous Stopped 05/13/24 2002)  acetaminophen  (TYLENOL ) tablet 1,000 mg (1,000 mg Oral Given 05/13/24 1751)  ceFEPIme  (MAXIPIME ) 2 g in sodium chloride  0.9 % 100 mL IVPB (0 g Intravenous Stopped 05/13/24 1921)  iohexol  (OMNIPAQUE ) 300  MG/ML solution 100 mL (100 mLs Intravenous Contrast Given 05/13/24 1826)     IMPRESSION / MDM / ASSESSMENT AND PLAN / ED COURSE  I reviewed the triage vital signs and the nursing notes.  Differential diagnosis includes, but is not limited to, metabolic encephalopathy, sepsis, UTI, pneumonia, distal radius or hand fracture, toxidrome or medication side effect, meningitis or encephalitis  {Patient presents with symptoms of an acute illness or injury that is potentially life-threatening.  Patient presents from home with a nonfocal encephalopathy, SIRS criteria requiring medical admission.  Tachycardic with low-grade temperature, leukocytosis but remained stable without signs of shock.  Aside from her leukocytosis she has a fairly benign workup.  Nonfocal exam.  Normal CMP, lactic acid, procalcitonin, UA and imaging.  She is provided broad-spectrum antibiotics and a consult medicine for admission.  No clear meningeal features on exam  Clinical Course as of 05/13/24 2023  Sat May 13, 2024  1808 Daughter at the bedside provide supplemental history which is quite helpful. [DS]  1819 Urine is clear, we will broaden antibiotics and send patient to CT to help tease out etiology of her encephalopathy and SIRS criteria [DS]  1938 CT unrevealing.  I reassessed and discussed with patient and her daughter.  I recommend medical admission and they are agreeable [DS]    Clinical Course User Index [DS] Claudene Rover, MD     FINAL CLINICAL IMPRESSION(S) / ED DIAGNOSES   Final diagnoses:  Acute encephalopathy  SIRS (systemic inflammatory response syndrome) (HCC)     Rx / DC Orders   ED Discharge Orders     None        Note:  This document was prepared using Dragon voice recognition software and may include unintentional dictation errors.   Claudene Rover, MD 05/13/24 2024

## 2024-05-13 NOTE — ED Notes (Signed)
Patient transferred to  CT scan

## 2024-05-13 NOTE — H&P (Signed)
 Teresa [name]   PATIENT NAMEIantha [name]    MR#:  [id number]  DATE OF BIRTH:  1951-06-06  DATE OF ADMISSION:  05/13/2024  PRIMARY CARE PHYSICIAN: Diedra Lame, MD   Patient is coming from: Home  REQUESTING/REFERRING PHYSICIAN: Claudene Rover, MD   CHIEF COMPLAINT:   Chief Complaint  Patient presents with  . Altered Mental Status    HISTORY OF PRESENT ILLNESS:  Teresa [name] is a 73 y.o. female with medical history significant for anxiety, depression, dyslipidemia, type 2 diabetes mellitus with peripheral neuropathy, vitamin B12 deficiency and allergic rhinitis, who presented to the emergency room with acute onset of altered mental status.  She was noted to have low-grade fever in the ER.  She has been having urinary frequency without urgency or dysuria or hematuria or flank pain.  She admits to headache without dizziness or blurred vision.  Her daughter provides most of the history.  She was feeling weak she could not walk.  No reported chest pain or palpitations.  No reported nausea or vomiting or abdominal pain diarrhea or melena or rectal bleeding per rectum.  No cough or wheezing or dyspnea.  The patient was seen for left knee pain about 12 days ago and was prescribed prednisone and baclofen.  She was noted to have progressive confusion during the visit.  She reports right hand and wrist pain and had a fall recently.    ED Course: When he came to the ER, temperature was 100.1 and BP 157/87 with heart rate of 102 and otherwise normal vital signs.  Later on respiratory rate was up to 22 with a heart rate of 110 and BP 167/148.  Labs revealed blood glucose of 139 and total bili 1.3 with otherwise unremarkable CMP.  Lactic acid was 1.6 and procalcitonin less than 0.1.  CBC showed leukocytosis 17.5 with neutrophilia.  PT and INR were normal.  UA came back negative. EKG as reviewed by me : EKG showed sinus tachycardia with a rate of 109 with poor R wave progression. Imaging:  Portable chest x-ray showed no acute cardiopulmonary disease.  Right hand and wrist x-ray showed osteopenia with slight degenerative change and small well-corticated density adjacent to the ulnar styloid favoring chronic process as old trauma or accessory ossicle.  The patient was given IV vancomycin , cefepime  and Flagyl  as well as 1 L bolus of IV lactated Ringer  and 1 g of p.o. Tylenol .  She will be admitted to the medical telemetry bed for further evaluation and management. PAST MEDICAL HISTORY:   Past Medical History:  Diagnosis Date  . Allergic rhinitis   . Anxiety   . Depression   . Hyperlipidemia   -Type 2 diabetes mellitus with peripheral neuropathy -Vitamin B12 deficiency PAST SURGICAL HISTORY:   Past Surgical History:  Procedure Laterality Date  . ABDOMINAL HYSTERECTOMY    . BACK SURGERY     2000  . TUBAL LIGATION      SOCIAL HISTORY:   Social History   Tobacco Use  . Smoking status: Never  . Smokeless tobacco: Not on file  Substance Use Topics  . Alcohol use: Not on file    FAMILY HISTORY:   Family History  Problem Relation Age of Onset  . Breast cancer Neg Hx     DRUG ALLERGIES:   Allergies  Allergen Reactions  . Aleve [Naproxen Sodium]     Facial swelling  . Aspirin     REACTION: nausea    REVIEW OF  SYSTEMS:   ROS As per history of present illness. All pertinent systems were reviewed above. Constitutional, HEENT, cardiovascular, respiratory, GI, GU, musculoskeletal, neuro, psychiatric, endocrine, integumentary and hematologic systems were reviewed and are otherwise negative/unremarkable except for positive findings mentioned above in the HPI.   MEDICATIONS AT HOME:   Prior to Admission medications   Medication Sig Start Date End Date Taking? Authorizing Provider  acetaminophen  (TYLENOL ) 500 MG tablet Take 1 tablet (500 mg total) by mouth every 4 (four) hours as needed. 03/08/15   Jiles Satterfield, MD  cetirizine  (ZYRTEC ) 10 MG tablet Take 1 tablet  (10 mg total) by mouth daily. 03/08/15   Jiles Satterfield, MD  chlorpheniramine  (CHLOR-TRIMETON ) 4 MG tablet Take 1 tablet (4 mg total) by mouth 2 (two) times daily as needed for allergies. Patient not taking: Reported on 02/20/2015 03/08/14   Dianna Pao, MD  gabapentin  (NEURONTIN ) 300 MG capsule Take 1 capsule (300 mg total) by mouth daily. 10/10/15   Rice, Lonni ORN, MD  ibuprofen  (ADVIL ,MOTRIN ) 800 MG tablet Take 1 tablet (800 mg total) by mouth every 8 (eight) hours as needed. 10/10/15   Jeannetta Lonni ORN, MD  MELOXICAM PO Take by mouth.    [provider]  ondansetron  (ZOFRAN -ODT) 4 MG disintegrating tablet Take 1 tablet (4 mg total) by mouth every 8 (eight) hours as needed for nausea or vomiting. 12/20/22   Dorothyann Drivers, MD  simvastatin  (ZOCOR ) 10 MG tablet TAKE 1 TABLET BY MOUTH AT  BEDTIME 09/13/15   Rice, Lonni ORN, MD      VITAL SIGNS:  Blood pressure (!) 157/87, pulse (!) 102, temperature 98.7 F (37.1 C), temperature source Oral, resp. rate 16, height 5' 4 (1.626 m), weight 77.8 kg, SpO2 100%.  PHYSICAL EXAMINATION:  Physical Exam  GENERAL:  73 y.o.-year-old patient lying in the bed with no acute distress.  EYES: Pupils equal, round, reactive to light and accommodation. No scleral icterus. Extraocular muscles intact.  HEENT: Head atraumatic, normocephalic. Oropharynx and nasopharynx clear.  NECK:  Supple, no jugular venous distention. No thyroid  enlargement, no tenderness.  LUNGS: Normal breath sounds bilaterally, no wheezing, rales,rhonchi or crepitation. No use of accessory muscles of respiration.  CARDIOVASCULAR: Regular rate and rhythm, S1, S2 normal. No murmurs, rubs, or gallops.  ABDOMEN: Soft, nondistended, nontender. Bowel sounds present. No organomegaly or mass.  EXTREMITIES: No pedal edema, cyanosis, or clubbing.  NEUROLOGIC: Cranial nerves II through XII are intact. Muscle strength 5/5 in all extremities. Sensation intact. Gait not checked.   PSYCHIATRIC: The patient is alert and oriented x 3.  Normal affect and good eye contact. SKIN: No obvious rash, lesion, or ulcer.   LABORATORY PANEL:   CBC Recent Labs  Lab 05/13/24 1716  WBC 17.5*  HGB 13.8  HCT 42.1  PLT 321   ------------------------------------------------------------------------------------------------------------------  Chemistries  Recent Labs  Lab 05/13/24 1716  NA 138  K 4.4  CL 103  CO2 23  GLUCOSE 139*  BUN 17  CREATININE 0.79  CALCIUM 9.2  AST 36  ALT 28  ALKPHOS 50  BILITOT 1.3*   ------------------------------------------------------------------------------------------------------------------  Cardiac Enzymes No results for input(s): TROPONINI in the last 168 hours. ------------------------------------------------------------------------------------------------------------------  RADIOLOGY:  CT CHEST ABDOMEN PELVIS W CONTRAST Result Date: 05/13/2024 EXAM: CT CHEST, ABDOMEN AND PELVIS WITH CONTRAST 05/13/2024 06:51:06 PM TECHNIQUE: CT of the chest, abdomen and pelvis was performed with the administration of intravenous contrast. Multiplanar reformatted images are provided for review. Automated exposure control, iterative reconstruction, and/or weight based adjustment of the  mA/kV was utilized to reduce the radiation dose to as low as reasonably achievable. COMPARISON: None available. CLINICAL HISTORY: Sepsis, altered, no clear source. Patient coming home. Family reports lethargic and altered mental status. No last known well. Patient fell about a week ago affecting the right arm. The right arm is currently wrapped for improved pain relief. FINDINGS: CHEST: MEDIASTINUM: Heart and pericardium are unremarkable. The central airways are clear. THORACIC LYMPH NODES: No mediastinal, hilar or axillary lymphadenopathy. LUNGS AND PLEURA: No focal consolidation or pulmonary edema. No pleural effusion or pneumothorax. ABDOMEN AND PELVIS: LIVER: The liver  is unremarkable. GALLBLADDER AND BILE DUCTS: Gallbladder is unremarkable. No biliary ductal dilatation. SPLEEN: No acute abnormality. PANCREAS: No acute abnormality. ADRENAL GLANDS: No acute abnormality. KIDNEYS, URETERS AND BLADDER: No stones in the kidneys or ureters. No hydronephrosis. No perinephric or periureteral stranding. Urinary bladder is unremarkable. GI AND BOWEL: Stomach demonstrates no acute abnormality. There is no bowel obstruction. Mild sigmoid diverticulosis, without evidence of diverticulitis. Normal appendix. REPRODUCTIVE ORGANS: Status post hysterectomy. PERITONEUM AND RETROPERITONEUM: No ascites. No free air. VASCULATURE: Atherosclerotic calcifications of the abdominal aorta and branch vessels, although patent. ABDOMINAL AND PELVIS LYMPH NODES: No lymphadenopathy. REPRODUCTIVE ORGANS: No acute abnormality. BONES AND SOFT TISSUES: Degenerative changes of the visualized thoracolumbar spine with posterior fusion at T10-12. 3.3 x 2.4 x 3.1 cm lesion in the upper outer left breast. IMPRESSION: 1. 3.3 x 2.4 x 3.1 cm lesion in the upper outer left breast. Follow-up mammographic correlation is suggested. 2. No acute findings in the chest, abdomen, or pelvis. Electronically signed by: Pinkie Pebbles MD 05/13/2024 07:25 PM EDT RP Workstation: [id number]   CT HEAD WO CONTRAST ( ) Result Date: 05/13/2024 CLINICAL DATA:  nonfocal encephalopathy EXAM: CT HEAD WITHOUT CONTRAST TECHNIQUE: Contiguous axial images were obtained from the base of the skull through the vertex without intravenous contrast. RADIATION DOSE REDUCTION: This exam was performed according to the departmental dose-optimization program which includes automated exposure control, adjustment of the mA and/or kV according to patient size and/or use of iterative reconstruction technique. COMPARISON:  None Available. FINDINGS: Brain: Patchy and confluent areas of decreased attenuation are noted throughout the deep and periventricular white  matter of the cerebral hemispheres bilaterally, compatible with chronic microvascular ischemic disease. No evidence of large-territorial acute infarction. No parenchymal hemorrhage. No mass lesion. No extra-axial collection. No mass effect or midline shift. No hydrocephalus. Basilar cisterns are patent. Vascular: No hyperdense vessel. Atherosclerotic calcifications are present within the cavernous internal carotid arteries. Skull: No acute fracture or focal lesion. Sinuses/Orbits: Paranasal sinuses and mastoid air cells are clear. The orbits are unremarkable. Other: None. IMPRESSION: No acute intracranial abnormality. Electronically Signed   By: Morgane  Naveau M.D.   On: 05/13/2024 19:19   DG Hand Complete Right Result Date: 05/13/2024 CLINICAL DATA:  Fall 1 week ago with pain.  Altered mental status. EXAM: RIGHT WRIST - COMPLETE 4 VIEW; RIGHT HAND - COMPLETE 3 VIEW COMPARISON:  None Available. FINDINGS: Osteopenia. There is a well corticated density adjacent to the ulnar styloid with possible donor site. This could be sequela however of old trauma due to the appearance. Please correlate to point tenderness. Otherwise no acute fracture or dislocation. Preserved joint spaces overall. Only slight joint space loss of the distal interphalangeal joint of the fourth and fifth digits. If there is persistent pain or further concern of a scaphoid injury recommend treatment with follow-up imaging in 7-10 days to assess for occult abnormality. IMPRESSION: Osteopenia with slight degenerative change. Small well  corticated density adjacent to the ulnar styloid. Based on appearance favor a chronic process such as old trauma or accessory ossicle. Please correlate to point tenderness. Electronically Signed   By: Ranell Bring M.D.   On: 05/13/2024 18:48   DG Wrist Complete Right Result Date: 05/13/2024 CLINICAL DATA:  Fall 1 week ago with pain.  Altered mental status. EXAM: RIGHT WRIST - COMPLETE 4 VIEW; RIGHT HAND - COMPLETE  3 VIEW COMPARISON:  None Available. FINDINGS: Osteopenia. There is a well corticated density adjacent to the ulnar styloid with possible donor site. This could be sequela however of old trauma due to the appearance. Please correlate to point tenderness. Otherwise no acute fracture or dislocation. Preserved joint spaces overall. Only slight joint space loss of the distal interphalangeal joint of the fourth and fifth digits. If there is persistent pain or further concern of a scaphoid injury recommend treatment with follow-up imaging in 7-10 days to assess for occult abnormality. IMPRESSION: Osteopenia with slight degenerative change. Small well corticated density adjacent to the ulnar styloid. Based on appearance favor a chronic process such as old trauma or accessory ossicle. Please correlate to point tenderness. Electronically Signed   By: Ranell Bring M.D.   On: 05/13/2024 18:48   DG Chest Port 1 View Result Date: 05/13/2024 CLINICAL DATA:  Questionable sepsis. EXAM: PORTABLE CHEST 1 VIEW COMPARISON:  None Available. FINDINGS: No consolidation, pneumothorax or effusion. No edema. Normal cardiopericardial silhouette. Calcified aorta. Overlapping cardiac leads. Fixation hardware along the lower thoracic spine. IMPRESSION: No acute cardiopulmonary disease. Electronically Signed   By: Ranell Bring M.D.   On: 05/13/2024 18:44      IMPRESSION AND PLAN:  Assessment and Plan: * Acute encephalopathy - This is of unclear etiology.  Polypharmacy and infectious etiology would be in the differential diagnoses. - The patient will be admitted to medical telemetry bed. - Will follow neurochecks for 4 hours for 24 hours. - Given SIRS we will cover with IV antibiotics as mentioned below. - Will follow blood cultures. - Given her generalized weakness, will obtain a PT consult.  SIRS (systemic inflammatory response syndrome) (HCC) - Will empirically continue IV vancomycin , cefepime  and Flagyl . - Will continue  hydration with IV lactated Ringer . - We will follow blood cultures. - His UA and chest x-ray came back negative.   Dyslipidemia - We will continue statin therapy.  Type 2 diabetes mellitus with peripheral neuropathy (HCC) - The patient will be placed on supplemental coverage with NovoLog. - Will continue Neurontin .  B12 deficiency - Continue vitamin B12.   DVT prophylaxis: Lovenox .  Advanced Care Planning:  Code Status: full code.  Family Communication:  The plan of care was discussed in details with the patient (and family). I answered all questions. The patient agreed to proceed with the above mentioned plan. Further management will depend upon hospital course. Disposition Plan: Back to previous home environment Consults called: none.  All the records are reviewed and case discussed with ED provider.  Status is: Inpatient  At the time of the admission, it appears that the appropriate admission status for this patient is inpatient.  This is judged to be reasonable and necessary in order to provide the required intensity of service to ensure the patient's safety given the presenting symptoms, physical exam findings and initial radiographic and laboratory data in the context of comorbid conditions.  The patient requires inpatient status due to high intensity of service, high risk of further deterioration and high frequency of surveillance required.  I certify that at the time of admission, it is my clinical judgment that the patient will require inpatient hospital care extending more than 2 midnights.                            Dispo: The patient is from: Home              Anticipated d/c is to: Home              Patient currently is not medically stable to d/c.              Difficult to place patient: No  Madison DELENA Peaches M.D on 05/13/2024 at 9:44 PM  [facility]   From 7 PM-7 AM, contact night-coverage [email or web address]  CC: Primary care physician; Diedra Lame, MD

## 2024-05-13 NOTE — Assessment & Plan Note (Signed)
-   The patient will be placed on supplemental coverage with NovoLog. - Will continue Neurontin.

## 2024-05-13 NOTE — Assessment & Plan Note (Signed)
-   The patient will be placed on supplemental coverage with NovoLog. 

## 2024-05-14 ENCOUNTER — Other Ambulatory Visit: Payer: Self-pay

## 2024-05-14 DIAGNOSIS — G9340 Encephalopathy, unspecified: Secondary | ICD-10-CM | POA: Diagnosis not present

## 2024-05-14 LAB — BASIC METABOLIC PANEL WITH GFR
Anion gap: 10 (ref 5–15)
BUN: 14 mg/dL (ref 8–23)
CO2: 22 mmol/L (ref 22–32)
Calcium: 8.8 mg/dL — ABNORMAL LOW (ref 8.9–10.3)
Chloride: 104 mmol/L (ref 98–111)
Creatinine, Ser: 0.71 mg/dL (ref 0.44–1.00)
GFR, Estimated: >60 mL/min (ref >60)
Glucose, Bld: 136 mg/dL — ABNORMAL HIGH (ref 70–99)
Potassium: 3.8 mmol/L (ref 3.5–5.1)
Sodium: 136 mmol/L (ref 135–145)

## 2024-05-14 LAB — CBC
HCT: 35.8 % — ABNORMAL LOW (ref 36.0–46.0)
Hemoglobin: 11.9 g/dL — ABNORMAL LOW (ref 12.0–15.0)
MCH: 27 pg (ref 26.0–34.0)
MCHC: 33.2 g/dL (ref 30.0–36.0)
MCV: 81.4 fL (ref 80.0–100.0)
Platelets: 268 K/uL (ref 150–400)
RBC: 4.4 MIL/uL (ref 3.87–5.11)
RDW: 13.9 % (ref 11.5–15.5)
WBC: 14.8 K/uL — ABNORMAL HIGH (ref 4.0–10.5)
nRBC: 0 % (ref 0.0–0.2)

## 2024-05-14 LAB — PROTIME-INR
INR: 1.1 (ref 0.8–1.2)
Prothrombin Time: 15.3 s — ABNORMAL HIGH (ref 11.4–15.2)

## 2024-05-14 LAB — CORTISOL-AM, BLOOD: Cortisol - AM: 15.8 ug/dL (ref 6.7–22.6)

## 2024-05-14 NOTE — ED Notes (Addendum)
 This tech and RN Rosina helped pt ambulate to restroom with a walker. Pt able to ambulate with a slow steady gait to restroom but became weak after using the restroom and needed to be wheeled back into room. Pt placed in a new brief and is now resting in bed with no further needs.

## 2024-05-14 NOTE — Progress Notes (Signed)
  PROGRESS NOTE    Teresa [name]  FMW:[id number] DOB: 1950-12-20 DOA: 05/13/2024 PCP: Diedra Lame, MD  [id number]/[id number]  LOS: 1 day   Brief hospital course:   Assessment & Plan: Teresa [name] is a 73 y.o. female with medical history significant for anxiety, depression, dyslipidemia, type 2 diabetes mellitus with peripheral neuropathy, vitamin B12 deficiency and allergic rhinitis, who presented to the emergency room with acute onset of altered mental status.  She was noted to have low-grade fever in the ER.  She has been having urinary frequency without urgency or dysuria or hematuria or flank pain.   The patient was seen for left knee pain about 12 days ago and was prescribed prednisone and baclofen. She was noted to have progressive confusion during the visit. She reports right hand and wrist pain and had a fall recently.    * Acute encephalopathy - This is of unclear etiology.  Possible due to baclofen and/or gabapentin , however, can not confirm or deny whether pt was taking them or how much. --pt was more alert this morning. --monitor for 1 more day.  SIRS (systemic inflammatory response syndrome) (HCC) Sepsis ruled out --no source of infection found yet.  UA neg for infection.  CT c/a/p no acute finding. --d/c abx --f/u blood cx  Leukocytosis --unknown etiology.  Currently no source of infection. --monitor  Breast lesion --3.3 x 2.4 x 3.1 cm lesion in the upper outer left breast.  Discussed with pt, husband and son. --advise outpatient mammogram.   Dyslipidemia - continue statin therapy.  Type 2 diabetes mellitus with peripheral neuropathy (HCC) --last A1c 6.5. --no need for BG checks   DVT prophylaxis: Lovenox  SQ Code Status: Full code  Family Communication: son and husband updated at bedside today Level of care: Telemetry Medical Dispo:   The patient is from: home Anticipated d/c is to: home Anticipated d/c date is: tomorrow   Subjective and Interval  History:  Pt was more alert this morning.  No complaint.  Family reported pt has become more forgetful.     Objective: Vitals:   05/14/24 1300 05/14/24 1330 05/14/24 1400 05/14/24 1430  BP: (!) 108/55 114/60 113/62 114/62  Pulse: 77 74 72 72  Resp: (!) 26 (!) 22 17 (!) 25  Temp:      TempSrc:      SpO2: 100% 100% 100% 99%  Weight:      Height:        Intake/Output Summary (Last 24 hours) at 05/14/2024 1548 Last data filed at 05/13/2024 2201 Gross per 24 hour  Intake 1400 ml  Output --  Net 1400 ml   Filed Weights   05/13/24 1714  Weight: 77.8 kg    Examination:   Constitutional: NAD, alert, oriented to person and place HEENT: conjunctivae and lids normal, EOMI CV: No cyanosis.   RESP: normal respiratory effort, on RA Neuro: II - XII grossly intact.   Psych: Normal mood and affect.     Data Reviewed: I have personally reviewed labs and imaging studies  Time spent: 50 minutes  Ellouise Haber, MD [facility] If 7PM-7AM, please contact night-coverage 05/14/2024, 3:48 PM

## 2024-05-15 DIAGNOSIS — G9340 Encephalopathy, unspecified: Secondary | ICD-10-CM | POA: Diagnosis not present

## 2024-05-15 LAB — URINE CULTURE: Culture: NO GROWTH

## 2024-05-15 LAB — BASIC METABOLIC PANEL WITH GFR
Anion gap: 10 (ref 5–15)
BUN: 14 mg/dL (ref 8–23)
CO2: 25 mmol/L (ref 22–32)
Calcium: 8.7 mg/dL — ABNORMAL LOW (ref 8.9–10.3)
Chloride: 102 mmol/L (ref 98–111)
Creatinine, Ser: 0.67 mg/dL (ref 0.44–1.00)
GFR, Estimated: >60 mL/min (ref >60)
Glucose, Bld: 94 mg/dL (ref 70–99)
Potassium: 3.8 mmol/L (ref 3.5–5.1)
Sodium: 137 mmol/L (ref 135–145)

## 2024-05-15 LAB — CBC
HCT: 35.7 % — ABNORMAL LOW (ref 36.0–46.0)
Hemoglobin: 12.1 g/dL (ref 12.0–15.0)
MCH: 27.1 pg (ref 26.0–34.0)
MCHC: 33.9 g/dL (ref 30.0–36.0)
MCV: 79.9 fL — ABNORMAL LOW (ref 80.0–100.0)
Platelets: 299 K/uL (ref 150–400)
RBC: 4.47 MIL/uL (ref 3.87–5.11)
RDW: 13.7 % (ref 11.5–15.5)
WBC: 10.2 K/uL (ref 4.0–10.5)
nRBC: 0 % (ref 0.0–0.2)

## 2024-05-15 LAB — MAGNESIUM: Magnesium: 2 mg/dL (ref 1.7–2.4)

## 2024-05-15 MED ORDER — VENLAFAXINE HCL ER 75 MG PO CP24: 75.0000 mg | ORAL_CAPSULE | Freq: Every day | ORAL | 0 refills | Status: DC

## 2024-05-15 NOTE — Plan of Care (Signed)

## 2024-05-15 NOTE — Discharge Summary (Signed)
 Physician Discharge Summary   Teresa [name]  female DOB: [birth year]  FMW:[id number]  PCP: Diedra Lame, MD  Admit date: 05/13/2024 Discharge date: 05/15/2024  Admitted From: home Disposition:  home Daughter updated at bedside prior to discharge. Home Health: Yes CODE STATUS: Full code  Discharge Instructions     Discharge instructions   Complete by: As directed    Please be sure to follow up with your primary care physician to get mammogram as soon as possible for your left breast lump.  High doses of Effexor  can sometimes cause drowsiness.  I reduced your Effexor  back down to 75 mg daily to see if that helps. [facility]:  For full details, please see H&P, progress notes, consult notes and ancillary notes.  Briefly,  Teresa [name] is a 73 y.o. female with medical history significant for anxiety, depression, type 2 diabetes mellitus with peripheral neuropathy, who presented to the emergency room with acute onset of altered mental status.  Family reported pt had been more drowsy and forgetful.   The patient was seen for left knee pain about 12 days ago and was prescribed prednisone and baclofen. She was noted to have progressive confusion during the visit. She reports right hand and wrist pain and had a fall recently.    * Acute encephalopathy - This is of unclear etiology.  Possible due to baclofen and/or gabapentin , however, can not confirm or deny whether pt was taking them or how much. --pt was more alert next morning.  Pt was monitored for 1 more day and back to baseline prior to discharge. --Home venlafaxine  was reduced from 150 mg to 75 mg daily to reduce it's effect in causing drowsiness.    Note: Pt's venlafaxine  was reportedly increased for neuropathic pain.  Per pharm, venlafaxine 's utility in neuropathic pain is mainly seen at higher doses. At the lower doses its efficacy is mainly an antidepressant effect. Since drowsiness is an ongoing issue  for pt at baseline, her PCP may want to consider duloxetine as it has a tighter dosing range    SIRS (systemic inflammatory response syndrome) (HCC) Sepsis ruled out --no source of infection found.  UA neg for infection.  CT c/a/p no acute finding. --d/c'ed abx   Leukocytosis --unknown etiology.  Currently no source of infection.   Breast lesion --3.3 x 2.4 x 3.1 cm lesion in the upper outer left breast.  Discussed with pt, husband and son. --advise outpatient mammogram.    Dyslipidemia - continue statin therapy.   Type 2 diabetes mellitus with peripheral neuropathy (HCC) --last A1c 6.5. --no need for BG checks   Discharge Diagnoses:  Principal Problem:   Acute encephalopathy Active Problems:   SIRS (systemic inflammatory response syndrome) (HCC)   Dyslipidemia   Type 2 diabetes mellitus with peripheral neuropathy (HCC)   B12 deficiency   30 Day Unplanned Readmission Risk Score    Flowsheet Row ED to Hosp-Admission (Current) from 05/13/2024 in [facility] 1C MEDICAL TELEMETRY  30 Day Unplanned Readmission Risk Score (%) 9.68 Filed at 05/15/2024 0801    This score is the patient's risk of an unplanned readmission within 30 days of being discharged (0 -100%). The score is based on dignosis, age, lab data, medications, orders, and past utilization.   Low:  0-14.9   Medium: 15-21.9   High: 22-29.9   Extreme: 30 and above         Discharge Instructions:  Allergies as  of 05/15/2024       Reactions   Aleve [naproxen Sodium]    Facial swelling   Aspirin    REACTION: nausea        Medication List     STOP taking these medications    baclofen 10 MG tablet Commonly known as: LIORESAL       TAKE these medications    acetaminophen  500 MG tablet Commonly known as: TYLENOL  Take 1 tablet (500 mg total) by mouth every 4 (four) hours as needed.   atenolol  25 MG tablet Commonly known as: TENORMIN  Take 25 mg by mouth daily.   cetirizine  10  MG tablet Commonly known as: ZYRTEC  Take 1 tablet (10 mg total) by mouth daily. What changed:  when to take this reasons to take this   multivitamin with minerals Tabs tablet Take 1 tablet by mouth daily.   simvastatin  10 MG tablet Commonly known as: ZOCOR  TAKE 1 TABLET BY MOUTH AT  BEDTIME   venlafaxine  XR 75 MG 24 hr capsule Commonly known as: EFFEXOR -XR Take 1 capsule (75 mg total) by mouth daily. What changed:  medication strength how much to take         Follow-up Information     Diedra Lame, MD Follow up in 1 week(s).   Specialty: Family Medicine Contact information: [address] [facility] [place] [address] [phone]                 Allergies  Allergen Reactions   Aleve [Naproxen Sodium]     Facial swelling   Aspirin     REACTION: nausea     The results of significant diagnostics from this hospitalization (including imaging, microbiology, ancillary and laboratory) are listed below for reference.   Consultations:   Procedures/Studies: CT CHEST ABDOMEN PELVIS W CONTRAST Result Date: 05/13/2024 EXAM: CT CHEST, ABDOMEN AND PELVIS WITH CONTRAST 05/13/2024 06:51:06 PM TECHNIQUE: CT of the chest, abdomen and pelvis was performed with the administration of intravenous contrast. Multiplanar reformatted images are provided for review. Automated exposure control, iterative reconstruction, and/or weight based adjustment of the mA/kV was utilized to reduce the radiation dose to as low as reasonably achievable. COMPARISON: None available. CLINICAL HISTORY: Sepsis, altered, no clear source. Patient coming home. Family reports lethargic and altered mental status. No last known well. Patient fell about a week ago affecting the right arm. The right arm is currently wrapped for improved pain relief. FINDINGS: CHEST: MEDIASTINUM: Heart and pericardium are unremarkable. The central airways are clear. THORACIC LYMPH  NODES: No mediastinal, hilar or axillary lymphadenopathy. LUNGS AND PLEURA: No focal consolidation or pulmonary edema. No pleural effusion or pneumothorax. ABDOMEN AND PELVIS: LIVER: The liver is unremarkable. GALLBLADDER AND BILE DUCTS: Gallbladder is unremarkable. No biliary ductal dilatation. SPLEEN: No acute abnormality. PANCREAS: No acute abnormality. ADRENAL GLANDS: No acute abnormality. KIDNEYS, URETERS AND BLADDER: No stones in the kidneys or ureters. No hydronephrosis. No perinephric or periureteral stranding. Urinary bladder is unremarkable. GI AND BOWEL: Stomach demonstrates no acute abnormality. There is no bowel obstruction. Mild sigmoid diverticulosis, without evidence of diverticulitis. Normal appendix. REPRODUCTIVE ORGANS: Status post hysterectomy. PERITONEUM AND RETROPERITONEUM: No ascites. No free air. VASCULATURE: Atherosclerotic calcifications of the abdominal aorta and branch vessels, although patent. ABDOMINAL AND PELVIS LYMPH NODES: No lymphadenopathy. REPRODUCTIVE ORGANS: No acute abnormality. BONES AND SOFT TISSUES: Degenerative changes of the visualized thoracolumbar spine with posterior fusion at T10-12. 3.3 x 2.4 x 3.1 cm lesion in the upper  outer left breast. IMPRESSION: 1. 3.3 x 2.4 x 3.1 cm lesion in the upper outer left breast. Follow-up mammographic correlation is suggested. 2. No acute findings in the chest, abdomen, or pelvis. Electronically signed by: Pinkie Pebbles MD 05/13/2024 07:25 PM EDT RP Workstation: [id number]   CT HEAD WO CONTRAST ( ) Result Date: 05/13/2024 CLINICAL DATA:  nonfocal encephalopathy EXAM: CT HEAD WITHOUT CONTRAST TECHNIQUE: Contiguous axial images were obtained from the base of the skull through the vertex without intravenous contrast. RADIATION DOSE REDUCTION: This exam was performed according to the departmental dose-optimization program which includes automated exposure control, adjustment of the mA and/or kV according to patient size and/or use  of iterative reconstruction technique. COMPARISON:  None Available. FINDINGS: Brain: Patchy and confluent areas of decreased attenuation are noted throughout the deep and periventricular white matter of the cerebral hemispheres bilaterally, compatible with chronic microvascular ischemic disease. No evidence of large-territorial acute infarction. No parenchymal hemorrhage. No mass lesion. No extra-axial collection. No mass effect or midline shift. No hydrocephalus. Basilar cisterns are patent. Vascular: No hyperdense vessel. Atherosclerotic calcifications are present within the cavernous internal carotid arteries. Skull: No acute fracture or focal lesion. Sinuses/Orbits: Paranasal sinuses and mastoid air cells are clear. The orbits are unremarkable. Other: None. IMPRESSION: No acute intracranial abnormality. Electronically Signed   By: Morgane  Naveau M.D.   On: 05/13/2024 19:19   DG Hand Complete Right Result Date: 05/13/2024 CLINICAL DATA:  Fall 1 week ago with pain.  Altered mental status. EXAM: RIGHT WRIST - COMPLETE 4 VIEW; RIGHT HAND - COMPLETE 3 VIEW COMPARISON:  None Available. FINDINGS: Osteopenia. There is a well corticated density adjacent to the ulnar styloid with possible donor site. This could be sequela however of old trauma due to the appearance. Please correlate to point tenderness. Otherwise no acute fracture or dislocation. Preserved joint spaces overall. Only slight joint space loss of the distal interphalangeal joint of the fourth and fifth digits. If there is persistent pain or further concern of a scaphoid injury recommend treatment with follow-up imaging in 7-10 days to assess for occult abnormality. IMPRESSION: Osteopenia with slight degenerative change. Small well corticated density adjacent to the ulnar styloid. Based on appearance favor a chronic process such as old trauma or accessory ossicle. Please correlate to point tenderness. Electronically Signed   By: Ranell Bring M.D.   On:  05/13/2024 18:48   DG Wrist Complete Right Result Date: 05/13/2024 CLINICAL DATA:  Fall 1 week ago with pain.  Altered mental status. EXAM: RIGHT WRIST - COMPLETE 4 VIEW; RIGHT HAND - COMPLETE 3 VIEW COMPARISON:  None Available. FINDINGS: Osteopenia. There is a well corticated density adjacent to the ulnar styloid with possible donor site. This could be sequela however of old trauma due to the appearance. Please correlate to point tenderness. Otherwise no acute fracture or dislocation. Preserved joint spaces overall. Only slight joint space loss of the distal interphalangeal joint of the fourth and fifth digits. If there is persistent pain or further concern of a scaphoid injury recommend treatment with follow-up imaging in 7-10 days to assess for occult abnormality. IMPRESSION: Osteopenia with slight degenerative change. Small well corticated density adjacent to the ulnar styloid. Based on appearance favor a chronic process such as old trauma or accessory ossicle. Please correlate to point tenderness. Electronically Signed   By: Ranell Bring M.D.   On: 05/13/2024 18:48   DG Chest Port 1 View Result Date: 05/13/2024 CLINICAL DATA:  Questionable sepsis. EXAM: PORTABLE CHEST 1 VIEW COMPARISON:  None Available. FINDINGS: No consolidation, pneumothorax or effusion. No edema. Normal cardiopericardial silhouette. Calcified aorta. Overlapping cardiac leads. Fixation hardware along the lower thoracic spine. IMPRESSION: No acute cardiopulmonary disease. Electronically Signed   By: Ranell Bring M.D.   On: 05/13/2024 18:44      Labs: BNP (last 3 results) No results for input(s): BNP in the last 8760 hours. Basic Metabolic Panel: Recent Labs  Lab 05/13/24 1716 05/14/24 0641 05/15/24 0511  NA 138 136 137  K 4.4 3.8 3.8  CL 103 104 102  CO2 23 22 25   GLUCOSE 139* 136* 94  BUN 17 14 14   CREATININE 0.79 0.71 0.67  CALCIUM 9.2 8.8* 8.7*  MG  --   --  2.0   Liver Function Tests: Recent Labs  Lab  05/13/24 1716  AST 36  ALT 28  ALKPHOS 50  BILITOT 1.3*  PROT 7.5  ALBUMIN 3.8   No results for input(s): LIPASE, AMYLASE in the last 168 hours. No results for input(s): AMMONIA in the last 168 hours. CBC: Recent Labs  Lab 05/13/24 1716 05/14/24 0641 05/15/24 0511  WBC 17.5* 14.8* 10.2  NEUTROABS 12.7*  --   --   HGB 13.8 11.9* 12.1  HCT 42.1 35.8* 35.7*  MCV 80.7 81.4 79.9*  PLT 321 268 299   Cardiac Enzymes: No results for input(s): CKTOTAL, CKMB, CKMBINDEX, TROPONINI in the last 168 hours. BNP: Invalid input(s): POCBNP CBG: No results for input(s): GLUCAP in the last 168 hours. D-Dimer No results for input(s): DDIMER in the last 72 hours. Hgb A1c No results for input(s): HGBA1C in the last 72 hours. Lipid Profile No results for input(s): CHOL, HDL, LDLCALC, TRIG, CHOLHDL, LDLDIRECT in the last 72 hours. Thyroid  function studies No results for input(s): TSH, T4TOTAL, T3FREE, THYROIDAB in the last 72 hours.  Invalid input(s): FREET3 Anemia work up No results for input(s): VITAMINB12, FOLATE, FERRITIN, TIBC, IRON, RETICCTPCT in the last 72 hours. Urinalysis    Component Value Date/Time   COLORURINE YELLOW (A) 05/13/2024 1800   APPEARANCEUR CLOUDY (A) 05/13/2024 1800   LABSPEC 1.019 05/13/2024 1800   PHURINE 6.0 05/13/2024 1800   GLUCOSEU NEGATIVE 05/13/2024 1800   HGBUR NEGATIVE 05/13/2024 1800   BILIRUBINUR NEGATIVE 05/13/2024 1800   BILIRUBINUR Negative 04/02/2014 1411   KETONESUR NEGATIVE 05/13/2024 1800   PROTEINUR NEGATIVE 05/13/2024 1800   UROBILINOGEN 0.2 04/02/2014 1411   UROBILINOGEN 0.2 04/02/2014 1410   NITRITE NEGATIVE 05/13/2024 1800   LEUKOCYTESUR NEGATIVE 05/13/2024 1800   Sepsis Labs Recent Labs  Lab 05/13/24 1716 05/14/24 0641 05/15/24 0511  WBC 17.5* 14.8* 10.2   Microbiology Recent Results (from the past 240 hours)  Blood Culture (routine x 2)     Status: None (Preliminary  result)   Collection Time: 05/13/24  5:16 PM   Specimen: BLOOD  Result Value Ref Range Status   Specimen Description BLOOD BLOOD LEFT FOREARM  Final   Special Requests   Final    BOTTLES DRAWN AEROBIC AND ANAEROBIC Blood Culture results may not be optimal due to an inadequate volume of blood received in culture bottles   Culture   Final    NO GROWTH 2 DAYS Performed at [facility], [address] [place], [place] [address]    Report Status PENDING  Incomplete  Blood Culture (routine x 2)     Status: None (Preliminary result)   Collection Time: 05/13/24  5:17 PM   Specimen: BLOOD  Result Value Ref Range Status   Specimen  Description BLOOD BLOOD RIGHT ARM  Final   Special Requests   Final    BOTTLES DRAWN AEROBIC AND ANAEROBIC Blood Culture results may not be optimal due to an inadequate volume of blood received in culture bottles   Culture   Final    NO GROWTH 2 DAYS Performed at [facility], [address] [place], [place] [address]    Report Status PENDING  Incomplete  Urine Culture     Status: None   Collection Time: 05/13/24  6:00 PM   Specimen: Urine, Clean Catch  Result Value Ref Range Status   Specimen Description   Final    URINE, CLEAN CATCH Performed at [facility], [address] [place], [place] [address]    Special Requests   Final    NONE Performed at [facility], [address] [place], [place] [address]    Culture   Final    NO GROWTH Performed at [facility], [address] [place], [place] [address]    Report Status 05/15/2024 FINAL  Final  Resp panel by RT-PCR (RSV, Flu A&B, Covid) Anterior Nasal Swab     Status: None   Collection Time: 05/13/24  6:52 PM   Specimen: Anterior Nasal Swab  Result Value Ref Range Status   SARS Coronavirus 2 by RT PCR NEGATIVE NEGATIVE Final    Comment: (NOTE) SARS-CoV-2 target nucleic acids are NOT DETECTED.  The SARS-CoV-2 RNA is generally detectable in upper  respiratory specimens during the acute phase of infection. The lowest concentration of SARS-CoV-2 viral copies this assay can detect is 138 copies/mL. A negative result does not preclude SARS-Cov-2 infection and should not be used as the sole basis for treatment or other patient management decisions. A negative result may occur with  improper specimen collection/handling, submission of specimen other than nasopharyngeal swab, presence of viral mutation(s) within the areas targeted by this assay, and inadequate number of viral copies(<138 copies/mL). A negative result must be combined with clinical observations, patient history, and epidemiological information. The expected result is Negative.  Fact Sheet for Patients:  BloggerCourse.com  Fact Sheet for Healthcare Providers:  SeriousBroker.it  This test is no t yet approved or cleared by the [place]  FDA and  has been authorized for detection and/or diagnosis of SARS-CoV-2 by FDA under an Emergency Use Authorization (EUA). This EUA will remain  in effect (meaning this test can be used) for the duration of the COVID-19 declaration under Section 564(b)(1) of the Act, 21 U.S.C.section 360bbb-3(b)(1), unless the authorization is terminated  or revoked sooner.       Influenza A by PCR NEGATIVE NEGATIVE Final   Influenza B by PCR NEGATIVE NEGATIVE Final    Comment: (NOTE) The Xpert Xpress SARS-CoV-2/FLU/RSV plus assay is intended as an aid in the diagnosis of influenza from Nasopharyngeal swab specimens and should not be used as a sole basis for treatment. Nasal washings and aspirates are unacceptable for Xpert Xpress SARS-CoV-2/FLU/RSV testing.  Fact Sheet for Patients: BloggerCourse.com  Fact Sheet for Healthcare Providers: SeriousBroker.it  This test is not yet approved or cleared by the [place]  FDA and has been  authorized for detection and/or diagnosis of SARS-CoV-2 by FDA under an Emergency Use Authorization (EUA). This EUA will remain in effect (meaning this test can be used) for the duration of the COVID-19 declaration under Section 564(b)(1) of the Act, 21 U.S.C. section 360bbb-3(b)(1), unless the authorization is terminated or revoked.     Resp Syncytial  Virus by PCR NEGATIVE NEGATIVE Final    Comment: (NOTE) Fact Sheet for Patients: BloggerCourse.com  Fact Sheet for Healthcare Providers: SeriousBroker.it  This test is not yet approved or cleared by the [place]  FDA and has been authorized for detection and/or diagnosis of SARS-CoV-2 by FDA under an Emergency Use Authorization (EUA). This EUA will remain in effect (meaning this test can be used) for the duration of the COVID-19 declaration under Section 564(b)(1) of the Act, 21 U.S.C. section 360bbb-3(b)(1), unless the authorization is terminated or revoked.  Performed at [facility], [address] [place], [place] [address]      Total time spend on discharging this patient, including the last patient exam, discussing the hospital stay, instructions for ongoing care as it relates to all pertinent caregivers, as well as preparing the medical discharge records, prescriptions, and/or referrals as applicable, is 40 minutes.    Ellouise Haber, MD  [facility] 05/15/2024, 9:45 AM

## 2024-05-15 NOTE — TOC Transition Note (Signed)
 Transition of Care [facility]) - Discharge Note   Patient Details  Name: ORALIA CRIGER MRN: [id number] Date of Birth: 1951/10/22  [facility]) CM/SW Contact:  Dalia GORMAN Fuse, RN Phone Number: 05/15/2024, 10:02 AM   Clinical Narrative:     TOC received note from the Hospitalist advising patient is being discharged and the patient is leaving in the next 10 mins. Patient's payor is [employer] and there is only one HH agency [place] with the plan, [employer]. TOC outreached to [place] with [employer]. Dorothe advised that it takes 5 days to get approval. Start of care for [facility] PT/OT is 05/19/24.  No other TOC needs noted.         Patient Goals and CMS Choice            Discharge Placement                       Discharge Plan and Services Additional resources added to the After Visit Summary for                                       Social Drivers of Health (SDOH) Interventions SDOH Screenings   Food Insecurity: Patient Unable To Answer (05/14/2024)  Housing: Unknown (05/14/2024)  Transportation Needs: Patient Unable To Answer (05/14/2024)  Utilities: Patient Unable To Answer (05/14/2024)  Financial Resource Strain: Patient Declined (12/07/2023)   Received from [facility]  Social Connections: Patient Unable To Answer (05/14/2024)  Tobacco Use: Unknown (05/13/2024)     Readmission Risk Interventions     No data to display

## 2024-05-17 DIAGNOSIS — Z008 Encounter for other general examination: Secondary | ICD-10-CM | POA: Diagnosis not present

## 2024-05-17 DIAGNOSIS — M6281 Muscle weakness (generalized): Secondary | ICD-10-CM | POA: Diagnosis not present

## 2024-05-18 LAB — CULTURE, BLOOD (ROUTINE X 2)
Culture: NO GROWTH
Culture: NO GROWTH

## 2024-05-20 DIAGNOSIS — M25512 Pain in left shoulder: Secondary | ICD-10-CM | POA: Diagnosis not present

## 2024-05-22 DIAGNOSIS — Z79899 Other long term (current) drug therapy: Secondary | ICD-10-CM | POA: Diagnosis not present

## 2024-05-22 DIAGNOSIS — E1169 Type 2 diabetes mellitus with other specified complication: Secondary | ICD-10-CM | POA: Diagnosis not present

## 2024-05-22 DIAGNOSIS — E785 Hyperlipidemia, unspecified: Secondary | ICD-10-CM | POA: Diagnosis not present

## 2024-05-25 DIAGNOSIS — E785 Hyperlipidemia, unspecified: Secondary | ICD-10-CM | POA: Diagnosis not present

## 2024-05-25 DIAGNOSIS — E11319 Type 2 diabetes mellitus with unspecified diabetic retinopathy without macular edema: Secondary | ICD-10-CM | POA: Diagnosis not present

## 2024-05-26 DIAGNOSIS — I10 Essential (primary) hypertension: Secondary | ICD-10-CM | POA: Diagnosis not present

## 2024-05-26 DIAGNOSIS — E1169 Type 2 diabetes mellitus with other specified complication: Secondary | ICD-10-CM | POA: Diagnosis not present

## 2024-05-26 DIAGNOSIS — G4710 Hypersomnia, unspecified: Secondary | ICD-10-CM | POA: Diagnosis not present

## 2024-05-26 DIAGNOSIS — R4189 Other symptoms and signs involving cognitive functions and awareness: Secondary | ICD-10-CM | POA: Diagnosis not present

## 2024-05-26 DIAGNOSIS — G9340 Encephalopathy, unspecified: Secondary | ICD-10-CM | POA: Diagnosis not present

## 2024-05-26 DIAGNOSIS — E785 Hyperlipidemia, unspecified: Secondary | ICD-10-CM | POA: Diagnosis not present

## 2024-06-01 DIAGNOSIS — K219 Gastro-esophageal reflux disease without esophagitis: Secondary | ICD-10-CM | POA: Diagnosis not present

## 2024-06-01 DIAGNOSIS — J309 Allergic rhinitis, unspecified: Secondary | ICD-10-CM | POA: Diagnosis not present

## 2024-06-01 DIAGNOSIS — R6510 Systemic inflammatory response syndrome (SIRS) of non-infectious origin without acute organ dysfunction: Secondary | ICD-10-CM | POA: Diagnosis not present

## 2024-06-01 DIAGNOSIS — Z556 Problems related to health literacy: Secondary | ICD-10-CM | POA: Diagnosis not present

## 2024-06-01 DIAGNOSIS — F419 Anxiety disorder, unspecified: Secondary | ICD-10-CM | POA: Diagnosis not present

## 2024-06-01 DIAGNOSIS — G9340 Encephalopathy, unspecified: Secondary | ICD-10-CM | POA: Diagnosis not present

## 2024-06-01 DIAGNOSIS — E785 Hyperlipidemia, unspecified: Secondary | ICD-10-CM | POA: Diagnosis not present

## 2024-06-01 DIAGNOSIS — D72829 Elevated white blood cell count, unspecified: Secondary | ICD-10-CM | POA: Diagnosis not present

## 2024-06-01 DIAGNOSIS — E1142 Type 2 diabetes mellitus with diabetic polyneuropathy: Secondary | ICD-10-CM | POA: Diagnosis not present

## 2024-06-01 DIAGNOSIS — E538 Deficiency of other specified B group vitamins: Secondary | ICD-10-CM | POA: Diagnosis not present

## 2024-06-02 DIAGNOSIS — M4714 Other spondylosis with myelopathy, thoracic region: Secondary | ICD-10-CM | POA: Diagnosis not present

## 2024-06-02 DIAGNOSIS — G959 Disease of spinal cord, unspecified: Secondary | ICD-10-CM | POA: Diagnosis not present

## 2024-06-06 DIAGNOSIS — G9340 Encephalopathy, unspecified: Secondary | ICD-10-CM | POA: Diagnosis not present

## 2024-06-06 DIAGNOSIS — R6510 Systemic inflammatory response syndrome (SIRS) of non-infectious origin without acute organ dysfunction: Secondary | ICD-10-CM | POA: Diagnosis not present

## 2024-06-06 DIAGNOSIS — E1142 Type 2 diabetes mellitus with diabetic polyneuropathy: Secondary | ICD-10-CM | POA: Diagnosis not present

## 2024-06-06 DIAGNOSIS — K219 Gastro-esophageal reflux disease without esophagitis: Secondary | ICD-10-CM | POA: Diagnosis not present

## 2024-06-06 DIAGNOSIS — J309 Allergic rhinitis, unspecified: Secondary | ICD-10-CM | POA: Diagnosis not present

## 2024-06-06 DIAGNOSIS — F419 Anxiety disorder, unspecified: Secondary | ICD-10-CM | POA: Diagnosis not present

## 2024-06-06 DIAGNOSIS — E538 Deficiency of other specified B group vitamins: Secondary | ICD-10-CM | POA: Diagnosis not present

## 2024-06-09 ENCOUNTER — Other Ambulatory Visit: Payer: Self-pay | Admitting: Family Medicine

## 2024-06-09 ENCOUNTER — Other Ambulatory Visit

## 2024-06-09 ENCOUNTER — Encounter

## 2024-06-09 DIAGNOSIS — N6321 Unspecified lump in the left breast, upper outer quadrant: Secondary | ICD-10-CM

## 2024-06-12 ENCOUNTER — Ambulatory Visit
Admission: RE | Admit: 2024-06-12 | Discharge: 2024-06-12 | Disposition: A | Discharge status: Home / Self Care | Source: Ambulatory Visit | Attending: Family Medicine | Admitting: Family Medicine

## 2024-06-12 DIAGNOSIS — R92323 Mammographic fibroglandular density, bilateral breasts: Secondary | ICD-10-CM | POA: Diagnosis not present

## 2024-06-12 DIAGNOSIS — N6321 Unspecified lump in the left breast, upper outer quadrant: Secondary | ICD-10-CM | POA: Insufficient documentation

## 2024-06-12 DIAGNOSIS — R921 Mammographic calcification found on diagnostic imaging of breast: Secondary | ICD-10-CM | POA: Diagnosis not present

## 2024-06-13 ENCOUNTER — Other Ambulatory Visit: Payer: Self-pay | Admitting: Family Medicine

## 2024-06-13 DIAGNOSIS — R928 Other abnormal and inconclusive findings on diagnostic imaging of breast: Secondary | ICD-10-CM

## 2024-06-20 ENCOUNTER — Ambulatory Visit
Admission: RE | Admit: 2024-06-20 | Discharge: 2024-06-20 | Disposition: A | Discharge status: Home / Self Care | Source: Ambulatory Visit | Attending: Family Medicine | Admitting: Family Medicine

## 2024-06-20 DIAGNOSIS — C50412 Malignant neoplasm of upper-outer quadrant of left female breast: Secondary | ICD-10-CM | POA: Diagnosis not present

## 2024-06-20 DIAGNOSIS — R928 Other abnormal and inconclusive findings on diagnostic imaging of breast: Secondary | ICD-10-CM | POA: Insufficient documentation

## 2024-06-20 DIAGNOSIS — C50212 Malignant neoplasm of upper-inner quadrant of left female breast: Secondary | ICD-10-CM | POA: Insufficient documentation

## 2024-06-20 DIAGNOSIS — N6321 Unspecified lump in the left breast, upper outer quadrant: Secondary | ICD-10-CM | POA: Diagnosis not present

## 2024-06-20 HISTORY — PX: BREAST BIOPSY: SHX20

## 2024-06-20 MED ORDER — LIDOCAINE 1 % OPTIME INJ - NO CHARGE
2.0000 mL | Freq: Once | INTRAMUSCULAR | Status: AC
  Administered 2024-06-20: 2 mL
  Filled 2024-06-20: qty 2

## 2024-06-20 MED ORDER — LIDOCAINE-EPINEPHRINE 1 %-1:100000 IJ SOLN
8.0000 mL | Freq: Once | INTRAMUSCULAR | Status: AC
  Administered 2024-06-20: 8 mL
  Filled 2024-06-20: qty 8

## 2024-06-21 LAB — SURGICAL PATHOLOGY

## 2024-06-27 ENCOUNTER — Encounter: Payer: Self-pay | Admitting: *Deleted

## 2024-06-27 DIAGNOSIS — C50912 Malignant neoplasm of unspecified site of left female breast: Secondary | ICD-10-CM

## 2024-06-27 NOTE — Progress Notes (Signed)
 Ms. Ladell will see Dr. Melanee on Friday 9/5 and Dr. Tye on Monday 9/8.

## 2024-06-27 NOTE — Progress Notes (Signed)
 Received referral for newly diagnosed breast cancer from [facility].  Navigation initiated.  Discussed medical oncologists and surgeons with daughter [name] and Wednesday and Friday last week.   Teresa [name] will call me back to let me know who they would like to see.

## 2024-06-30 ENCOUNTER — Encounter: Payer: Self-pay | Admitting: Oncology

## 2024-06-30 ENCOUNTER — Encounter: Payer: Self-pay | Admitting: *Deleted

## 2024-06-30 ENCOUNTER — Inpatient Hospital Stay: Attending: Oncology | Admitting: Oncology

## 2024-06-30 ENCOUNTER — Inpatient Hospital Stay

## 2024-06-30 VITALS — BP 112/72 | HR 72 | Temp 96.8°F | Resp 19 | Ht 64.0 in | Wt 166.4 lb

## 2024-06-30 DIAGNOSIS — Z7189 Other specified counseling: Secondary | ICD-10-CM

## 2024-06-30 DIAGNOSIS — Z90710 Acquired absence of both cervix and uterus: Secondary | ICD-10-CM | POA: Insufficient documentation

## 2024-06-30 DIAGNOSIS — Z5111 Encounter for antineoplastic chemotherapy: Secondary | ICD-10-CM | POA: Insufficient documentation

## 2024-06-30 DIAGNOSIS — Z1731 Human epidermal growth factor receptor 2 positive status: Secondary | ICD-10-CM | POA: Diagnosis not present

## 2024-06-30 DIAGNOSIS — C50912 Malignant neoplasm of unspecified site of left female breast: Secondary | ICD-10-CM

## 2024-06-30 DIAGNOSIS — Z79899 Other long term (current) drug therapy: Secondary | ICD-10-CM

## 2024-06-30 DIAGNOSIS — Z1722 Progesterone receptor negative status: Secondary | ICD-10-CM | POA: Diagnosis not present

## 2024-06-30 DIAGNOSIS — C50412 Malignant neoplasm of upper-outer quadrant of left female breast: Secondary | ICD-10-CM | POA: Diagnosis not present

## 2024-06-30 DIAGNOSIS — Z171 Estrogen receptor negative status [ER-]: Secondary | ICD-10-CM

## 2024-06-30 DIAGNOSIS — Z5189 Encounter for other specified aftercare: Secondary | ICD-10-CM | POA: Diagnosis not present

## 2024-06-30 HISTORY — DX: Malignant neoplasm of upper-outer quadrant of left female breast: Z17.1

## 2024-06-30 NOTE — Progress Notes (Signed)
 Accompanied patient and family to initial medical oncology appointment.   Reviewed Breast Cancer treatment handbook.   Care plan summary given to patient.   Reviewed outreach programs and cancer center services.

## 2024-06-30 NOTE — Progress Notes (Signed)
 New patient. Referral for: Infiltrating ductal carcinoma of left breast.

## 2024-06-30 NOTE — Progress Notes (Signed)
 Hematology/Oncology Consult note [facility] Telephone:([phone] [phone]  Patient Care Team: Diedra Lame, MD as PCP - General (Family Medicine) Georgina Shasta POUR, RN as Oncology Nurse Navigator Georgina Shasta POUR, RN as Oncology Nurse Navigator   Name of the patient: Teresa [name]  [id number]  03-08-1951    Reason for referral-new diagnosis of breast cancer   Referring physician-Dr. Diedra  Date of visit: 06/30/24   History of presenting illness-patient is a 73 year old female with a past medical history significant for chronic back pain status post lumbar laminectomy, type 2 diabetes who presented to the ER with symptoms of bodyaches and underwent CT chest abdomen and pelvis with contrast which incidentally showed left breast mass but no evidence of metastatic disease.    Patient subsequently underwent mammogram and ultrasound which showed 1. 8 cm group of highly suspicious UPPER-OUTER LEFT breast calcifications with 4.5 cm mass identified sonographically along the posterior aspect of the calcifications and a 2.3 cm mass identified sonographically along the anterior aspect of these calcifications. Tissue sampling of the posterior aspect of the 1-2 o'clock position mass 5 cm from the nipple in the anterior aspect of the 2.3 cm 2 o'clock position mass 3 cm from the nipple is recommended.2. No abnormal appearing LEFT axillary lymph nodes.3. No suspicious mammographic findings within the RIGHT breast.  Patient underwent core biopsy of both the breast masses.  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  The 2.3 cm mass was positive for grade 2 invasive mammary carcinoma and receptor testing was not done on that specimen.  Patient is here with her daughter today.  She lives with her husband and uses a walker to ambulate mainly because of her ongoing back pain.  Appetite is fair  G3, P3.   Age at first birth 10.  No use of birth control in the past.  She attained menopause in her 78s.  No use of hormone replacement therapy.  No history of breast cancer or any other type of cancer in the family.  ECOG PS- 2  Pain scale- 4   Review of systems- Review of Systems  Constitutional:  Negative for chills, fever, malaise/fatigue and weight loss.  HENT:  Negative for congestion, ear discharge and nosebleeds.   Eyes:  Negative for blurred vision.  Respiratory:  Negative for cough, hemoptysis, sputum production, shortness of breath and wheezing.   Cardiovascular:  Negative for chest pain, palpitations, orthopnea and claudication.  Gastrointestinal:  Negative for abdominal pain, blood in stool, constipation, diarrhea, heartburn, melena, nausea and vomiting.  Genitourinary:  Negative for dysuria, flank pain, frequency, hematuria and urgency.  Musculoskeletal:  Positive for back pain. Negative for joint pain and myalgias.  Skin:  Negative for rash.  Neurological:  Negative for dizziness, tingling, focal weakness, seizures, weakness and headaches.  Endo/Heme/Allergies:  Does not bruise/bleed easily.  Psychiatric/Behavioral:  Negative for depression and suicidal ideas. The patient does not have insomnia.     Allergies  Allergen Reactions  . Aleve [Naproxen Sodium]     Facial swelling  . Aspirin     REACTION: nausea    Patient Active Problem List   Diagnosis Date Noted  . Malignant neoplasm of upper-outer quadrant of left breast in female, estrogen receptor negative (HCC) 06/30/2024  . Acute encephalopathy 05/13/2024  . SIRS (systemic inflammatory response syndrome) (HCC) 05/13/2024  . Type 2 diabetes mellitus with peripheral neuropathy (HCC) 05/13/2024  .  B12 deficiency 05/13/2024  . Lumbar radiculopathy 02/20/2015  . Headache 02/20/2015  . Elevated CPK 02/20/2015  . Cystitis, acute 04/02/2014  . Breast cancer screening 01/22/2014  . Preventative health care 12/12/2012  .  Status post lumbar laminectomy 10/06/2012  . History of falling 10/06/2012  . Head injury 11/29/2008  . GERD 12/23/2006  . NUMBNESS, leg 12/23/2006  . Dyslipidemia 11/02/2006  . Allergic rhinitis 09/28/2006  . HOT FLASHES 09/28/2006  . Backache 09/28/2006     Past Medical History:  Diagnosis Date  . Allergic rhinitis   . Anxiety   . Depression   . Hyperlipidemia      Past Surgical History:  Procedure Laterality Date  . ABDOMINAL HYSTERECTOMY    . BACK SURGERY     2000  . BREAST BIOPSY Left 06/20/2024   u/s bx x2 clip ribbon and coil  . BREAST BIOPSY Left 06/20/2024   US  LT BREAST BX W LOC DEV EA ADD LESION IMG BX SPEC US  GUIDE 06/20/2024 [facility]  . BREAST BIOPSY Left 06/20/2024   US  LT BREAST BX W LOC DEV 1ST LESION IMG BX SPEC US  GUIDE 06/20/2024 [facility]  . TUBAL LIGATION      Social History   Socioeconomic History  . Marital status: Married    Spouse name: Not on file  . Number of children: 3  . Years of education: Not on file  . Highest education level: Not on file  Occupational History    Comment: Works as Social worker  Tobacco Use  . Smoking status: Never  . Smokeless tobacco: Not on file  Vaping Use  . Vaping status: Never Used  Substance and Sexual Activity  . Alcohol use: Never  . Drug use: Never  . Sexual activity: Not Currently  Other Topics Concern  . Not on file  Social History Narrative   ** Merged History Encounter **       Social Drivers of Health   Financial Resource Strain: Patient Declined (12/07/2023)   Received from [facility]   Overall Financial Resource Strain (CARDIA)   . Difficulty of Paying Living Expenses: Patient declined  Food Insecurity: No Food Insecurity (06/30/2024)   Hunger Vital Sign   . Worried About Programme researcher, broadcasting/film/video in the Last Year: Never true   . Ran Out of Food in the Last Year: Never true  Transportation Needs: No Transportation Needs (06/30/2024)   PRAPARE - Transportation   . Lack  of Transportation (Medical): No   . Lack of Transportation (Non-Medical): No  Physical Activity: Not on file  Stress: Not on file  Social Connections: Patient Unable To Answer (05/14/2024)   Social Connection and Isolation Panel   . Frequency of Communication with Friends and Family: Patient unable to answer   . Frequency of Social Gatherings with Friends and Family: Patient unable to answer   . Attends Religious Services: Patient unable to answer   . Active Member of Clubs or Organizations: Patient unable to answer   . Attends Banker Meetings: Patient unable to answer   . Marital Status: Patient unable to answer  Intimate Partner Violence: Not At Risk (06/30/2024)   Humiliation, Afraid, Rape, and Kick questionnaire   . Fear of Current or Ex-Partner: No   . Emotionally Abused: No   . Physically Abused: No   . Sexually Abused: No     Family History  Problem Relation Age of Onset  . Healthy Mother   . Arthritis Sister   .  Stroke Sister   . Arthritis Sister   . Breast cancer Neg Hx      Current Outpatient Medications:  .  acetaminophen  (TYLENOL ) 500 MG tablet, Take 1 tablet (500 mg total) by mouth every 4 (four) hours as needed., Disp: 120 tablet, Rfl: 1 .  atenolol  (TENORMIN ) 25 MG tablet, Take 25 mg by mouth daily., Disp: , Rfl:  .  cetirizine  (ZYRTEC ) 10 MG tablet, Take 1 tablet (10 mg total) by mouth daily. (Patient taking differently: Take 10 mg by mouth daily as needed for allergies.), Disp: 30 tablet, Rfl: 3 .  Multiple Vitamin (MULTIVITAMIN WITH MINERALS) TABS tablet, Take 1 tablet by mouth daily., Disp: , Rfl:  .  simvastatin  (ZOCOR ) 10 MG tablet, TAKE 1 TABLET BY MOUTH AT  BEDTIME, Disp: 30 tablet, Rfl: 2 .  venlafaxine  XR (EFFEXOR -XR) 75 MG 24 hr capsule, Take 1 capsule (75 mg total) by mouth daily., Disp: 30 capsule, Rfl: 0   Physical exam:  Vitals:   06/30/24 1108  BP: 112/72  Pulse: 72  Resp: 19  Temp: (!) 96.8 F (36 C)  TempSrc: Tympanic  SpO2:  98%  Weight: 166 lb 6.4 oz (75.5 kg)  Height: 5' 4 (1.626 m)   Physical Exam Cardiovascular:     Rate and Rhythm: Normal rate and regular rhythm.     Heart sounds: Normal heart sounds.  Pulmonary:     Effort: Pulmonary effort is normal.     Breath sounds: Normal breath sounds.  Abdominal:     General: Bowel sounds are normal.     Palpations: Abdomen is soft.  Skin:    General: Skin is warm and dry.  Neurological:     Mental Status: She is alert and oriented to person, place, and time.           Latest Ref Rng & Units 05/15/2024    5:11 AM  CMP  Glucose 70 - 99 mg/dL 94   BUN 8 - 23 mg/dL 14   Creatinine 9.55 - 1.00 mg/dL 9.32   Sodium 864 - 854 mmol/L 137   Potassium 3.5 - 5.1 mmol/L 3.8   Chloride 98 - 111 mmol/L 102   CO2 22 - 32 mmol/L 25   Calcium 8.9 - 10.3 mg/dL 8.7       Latest Ref Rng & Units 05/15/2024    5:11 AM  CBC  WBC 4.0 - 10.5 K/uL 10.2   Hemoglobin 12.0 - 15.0 g/dL 87.8   Hematocrit 63.9 - 46.0 % 35.7   Platelets 150 - 400 K/uL 299     No images are attached to the encounter.  US  LT BREAST BX W LOC DEV 1ST LESION IMG BX SPEC US  GUIDE Addendum Date: 06/21/2024 ADDENDUM REPORT: 06/21/2024 13:54 ADDENDUM: PATHOLOGY revealed: Site 1. Breast, left, needle core biopsy, 2 o'clock, 7 cmfn, ribbon : - INVASIVE DUCTAL CARCINOMA, - DUCTAL CARCINOMA IN SITU, SOLID AND CRIBRIFORM TYPES, HIGH NUCLEAR GRADE, WITH NECROSIS - OVERALL GRADE: 3- LYMPHOVASCULAR INVASION: NOT IDENTIFIED- CANCER LENGTH: 16 MM - CALCIFICATIONS: PRESENT - OTHER FINDINGS: NOT IDENTIFIED. Pathology results are CONCORDANT with imaging findings, per Corean Salter M.D. PATHOLOGY revealed: Site 2. Breast, left, needle core biopsy, 2 o'clock, 3 cmfn, coil - INVASIVE DUCTAL CARCINOMA - DUCTAL CARCINOMA IN SITU, SOLID, CRIBRIFORM AND MICROPAPILLARY TYPES, HIGH NUCLEAR GRADE, WITH NECROSIS - OVERALL GRADE: 2- LYMPHOVASCULAR INVASION: NOT IDENTIFIED - CANCER LENGTH: 6 MM - CALCIFICATIONS: PRESENT -  OTHER FINDINGS: NOT IDENTIFIED. Pathology results are CONCORDANT with imaging findings,  per Corean Salter M.D. Pathology results and recommendations below were discussed with patient by telephone on 06/21/2025 by Rock Hover RN. Patient reported biopsy site within normal limits with slight tenderness at the site. Post biopsy care instructions were reviewed, questions were answered and my direct phone number was provided to patient. Patient was instructed to call [facility] if any concerns or questions arise related to the biopsy. RECOMMENDATIONS: 1. Surgical and oncological consultation. Request for surgical and oncological consultation relayed to Shasta Ada RN at [facility] by Rock Hover RN on 06/21/2024. Pathology results reported by Rock Hover RN on 06/21/2024. Electronically Signed   By: Corean Salter M.D.   On: 06/21/2024 13:54   Result Date: 06/21/2024 CLINICAL DATA:  Palpable highly suspicious LEFT breast masses with associated calcifications EXAM: ULTRASOUND GUIDED LEFT BREAST CORE NEEDLE BIOPSY x2 COMPARISON:  Previous exam(s). PROCEDURE: I met with the patient and we discussed the procedure of ultrasound-guided biopsy, including benefits and alternatives. We discussed the high likelihood of a successful procedure. We discussed the risks of the procedure, including infection, bleeding, tissue injury, clip migration, and inadequate sampling. Informed written consent was given. The usual time-out protocol was performed immediately prior to the procedure. Site 1: LEFT breast 2 o'clock 7 cm from nipple Lesion quadrant: Upper outer quadrant Using sterile technique and 1% lidocaine  and 1% lidocaine  with epinephrine  as local anesthetic, under direct ultrasound visualization, a 14 gauge spring-loaded device was used to perform biopsy of a mass at 2 o'clock 7 cm from the nipple using a lateral approach. At the conclusion of the procedure a RIBBON shaped tissue marker clip was  deployed into the biopsy cavity. Follow up 2 view mammogram was performed and dictated separately. Site 2: LEFT breast 2 o'clock 3 cm from the nipple Lesion quadrant: Upper outer quadrant Using sterile technique and 1% lidocaine  and 1% lidocaine  with epinephrine  as local anesthetic, under direct ultrasound visualization, a 14 gauge spring-loaded device was used to perform biopsy of a mass at 2 o'clock 3 cm from the nipple using a lateral approach. At the conclusion of the procedure a COIL shaped tissue marker clip was deployed into the biopsy cavity. Follow up 2 view mammogram was performed and dictated separately. IMPRESSION: Ultrasound guided biopsy of 2 LEFT breast masses with associated calcifications in a segmental distribution along the 2 o'clock axis. No apparent complications. Electronically Signed: By: Corean Salter M.D. On: 06/20/2024 08:56   US  LT BREAST BX W LOC DEV EA ADD LESION IMG BX SPEC US  GUIDE Addendum Date: 06/21/2024 ADDENDUM REPORT: 06/21/2024 13:54 ADDENDUM: PATHOLOGY revealed: Site 1. Breast, left, needle core biopsy, 2 o'clock, 7 cmfn, ribbon : - INVASIVE DUCTAL CARCINOMA, - DUCTAL CARCINOMA IN SITU, SOLID AND CRIBRIFORM TYPES, HIGH NUCLEAR GRADE, WITH NECROSIS - OVERALL GRADE: 3- LYMPHOVASCULAR INVASION: NOT IDENTIFIED- CANCER LENGTH: 16 MM - CALCIFICATIONS: PRESENT - OTHER FINDINGS: NOT IDENTIFIED. Pathology results are CONCORDANT with imaging findings, per Corean Salter M.D. PATHOLOGY revealed: Site 2. Breast, left, needle core biopsy, 2 o'clock, 3 cmfn, coil - INVASIVE DUCTAL CARCINOMA - DUCTAL CARCINOMA IN SITU, SOLID, CRIBRIFORM AND MICROPAPILLARY TYPES, HIGH NUCLEAR GRADE, WITH NECROSIS - OVERALL GRADE: 2- LYMPHOVASCULAR INVASION: NOT IDENTIFIED - CANCER LENGTH: 6 MM - CALCIFICATIONS: PRESENT - OTHER FINDINGS: NOT IDENTIFIED. Pathology results are CONCORDANT with imaging findings, per Corean Salter M.D. Pathology results and recommendations below were discussed with  patient by telephone on 06/21/2025 by Rock Hover RN. Patient reported biopsy site within normal limits with slight  tenderness at the site. Post biopsy care instructions were reviewed, questions were answered and my direct phone number was provided to patient. Patient was instructed to call [facility] if any concerns or questions arise related to the biopsy. RECOMMENDATIONS: 1. Surgical and oncological consultation. Request for surgical and oncological consultation relayed to Shasta Ada RN at [facility] by Rock Hover RN on 06/21/2024. Pathology results reported by Rock Hover RN on 06/21/2024. Electronically Signed   By: Corean Salter M.D.   On: 06/21/2024 13:54   Result Date: 06/21/2024 CLINICAL DATA:  Palpable highly suspicious LEFT breast masses with associated calcifications EXAM: ULTRASOUND GUIDED LEFT BREAST CORE NEEDLE BIOPSY x2 COMPARISON:  Previous exam(s). PROCEDURE: I met with the patient and we discussed the procedure of ultrasound-guided biopsy, including benefits and alternatives. We discussed the high likelihood of a successful procedure. We discussed the risks of the procedure, including infection, bleeding, tissue injury, clip migration, and inadequate sampling. Informed written consent was given. The usual time-out protocol was performed immediately prior to the procedure. Site 1: LEFT breast 2 o'clock 7 cm from nipple Lesion quadrant: Upper outer quadrant Using sterile technique and 1% lidocaine  and 1% lidocaine  with epinephrine  as local anesthetic, under direct ultrasound visualization, a 14 gauge spring-loaded device was used to perform biopsy of a mass at 2 o'clock 7 cm from the nipple using a lateral approach. At the conclusion of the procedure a RIBBON shaped tissue marker clip was deployed into the biopsy cavity. Follow up 2 view mammogram was performed and dictated separately. Site 2: LEFT breast 2 o'clock 3 cm from the nipple Lesion quadrant: Upper outer  quadrant Using sterile technique and 1% lidocaine  and 1% lidocaine  with epinephrine  as local anesthetic, under direct ultrasound visualization, a 14 gauge spring-loaded device was used to perform biopsy of a mass at 2 o'clock 3 cm from the nipple using a lateral approach. At the conclusion of the procedure a COIL shaped tissue marker clip was deployed into the biopsy cavity. Follow up 2 view mammogram was performed and dictated separately. IMPRESSION: Ultrasound guided biopsy of 2 LEFT breast masses with associated calcifications in a segmental distribution along the 2 o'clock axis. No apparent complications. Electronically Signed: By: Corean Salter M.D. On: 06/20/2024 08:56   MM CLIP PLACEMENT LEFT Result Date: 06/20/2024 CLINICAL DATA:  Status post 2 site ultrasound-guided biopsy EXAM: 3D DIAGNOSTIC LEFT MAMMOGRAM POST ULTRASOUND BIOPSY COMPARISON:  Previous exam(s). ACR Breast Density Category b: There are scattered areas of fibroglandular density. FINDINGS: 3D Mammographic images were obtained following ultrasound guided biopsy of a mass at 2 o'clock 7 cm from nipple. The RIBBON biopsy marking clip is in expected position at the site of biopsy. This is at the superior posterior site of segmental calcifications. 3D Mammographic images were obtained following ultrasound guided biopsy of a mass at 2 o'clock 3 cm from the nipple. The COIL biopsy marking clip is in expected position at the site of biopsy. This is at the anterior site of the segmental calcifications. Biopsy clips span approximately 5.5 cm. IMPRESSION: 1. Appropriate positioning of the RIBBON shaped biopsy marking clip at the site of biopsy in the upper outer breast at posterior depth. 2. Appropriate positioning of the COIL shaped biopsy marking clip at the site of biopsy in the upper outer breast at anterior depth. 3. Biopsy clips are approximately 5.5 cm apart. Final Assessment: Post Procedure Mammograms for Marker Placement Electronically  Signed   By: Corean Salter M.D.  On: 06/20/2024 08:59   MM 3D DIAGNOSTIC MAMMOGRAM BILATERAL BREAST Result Date: 06/12/2024 CLINICAL DATA:  73 year old female presents with LEFT breast mass identified on recent CT. Also for annual bilateral mammogram. EXAM: DIGITAL DIAGNOSTIC BILATERAL MAMMOGRAM WITH TOMOSYNTHESIS AND CAD; ULTRASOUND LEFT BREAST LIMITED TECHNIQUE: Bilateral digital diagnostic mammography and breast tomosynthesis was performed. The images were evaluated with computer-aided detection. ; Targeted ultrasound examination of the left breast was performed. COMPARISON:  Previous exam(s). ACR Breast Density Category b: There are scattered areas of fibroglandular density. FINDINGS: Full field views of both breasts and magnification and spot compression views of the LEFT breast are performed. An 8 cm group of pleomorphic calcifications is identified within the UPPER OUTER LEFT breast. A 4.5 cm irregular mass identified with the posterior aspect of these calcifications and a 2.3 cm masses associated with the anterior aspect of these calcifications. No other new or suspicious mammographic findings within either breast identified. Targeted ultrasound is performed, showing the following: A 3.9 x 2.4 x 4.5 cm irregular hypoechoic mass containing calcifications is centered at the 1-2 o'clock position of the LEFT breast 5 cm from the nipple. A 2.3 x 0.9 x 1 cm heterogeneous hypoechoic mass containing calcifications is centered at the 2 o'clock position 3 cm from the nipple. These 2 masses span a distance of at least 6.8 cm and have a thin linear hypoechoic connection. No abnormal appearing LEFT axillary lymph nodes are noted. IMPRESSION: 1. 8 cm group of highly suspicious UPPER-OUTER LEFT breast calcifications with 4.5 cm mass identified sonographically along the posterior aspect of the calcifications and a 2.3 cm mass identified sonographically along the anterior aspect of these calcifications. Tissue  sampling of the posterior aspect of the 1-2 o'clock position mass 5 cm from the nipple in the anterior aspect of the 2.3 cm 2 o'clock position mass 3 cm from the nipple is recommended. 2. No abnormal appearing LEFT axillary lymph nodes. 3. No suspicious mammographic findings within the RIGHT breast. RECOMMENDATION: Ultrasound-guided biopsy of posterior aspect of 4.5 cm 1-2 o'clock position LEFT breast mass 5 cm from the nipple and ultrasound-guided biopsy of anterior aspect of 2.3 cm 2 o'clock position LEFT breast mass 3 cm from the nipple. I have discussed the findings and recommendations with the patient. If applicable, a reminder letter will be sent to the patient regarding the next appointment. BI-RADS CATEGORY  5: Highly suggestive of malignancy. Electronically Signed   By: Reyes Phi M.D.   On: 06/12/2024 14:05   US  LIMITED ULTRASOUND INCLUDING AXILLA LEFT BREAST  Result Date: 06/12/2024 CLINICAL DATA:  73 year old female presents with LEFT breast mass identified on recent CT. Also for annual bilateral mammogram. EXAM: DIGITAL DIAGNOSTIC BILATERAL MAMMOGRAM WITH TOMOSYNTHESIS AND CAD; ULTRASOUND LEFT BREAST LIMITED TECHNIQUE: Bilateral digital diagnostic mammography and breast tomosynthesis was performed. The images were evaluated with computer-aided detection. ; Targeted ultrasound examination of the left breast was performed. COMPARISON:  Previous exam(s). ACR Breast Density Category b: There are scattered areas of fibroglandular density. FINDINGS: Full field views of both breasts and magnification and spot compression views of the LEFT breast are performed. An 8 cm group of pleomorphic calcifications is identified within the UPPER OUTER LEFT breast. A 4.5 cm irregular mass identified with the posterior aspect of these calcifications and a 2.3 cm masses associated with the anterior aspect of these calcifications. No other new or suspicious mammographic findings within either breast identified. Targeted  ultrasound is performed, showing the following: A 3.9 x 2.4 x 4.5 cm  irregular hypoechoic mass containing calcifications is centered at the 1-2 o'clock position of the LEFT breast 5 cm from the nipple. A 2.3 x 0.9 x 1 cm heterogeneous hypoechoic mass containing calcifications is centered at the 2 o'clock position 3 cm from the nipple. These 2 masses span a distance of at least 6.8 cm and have a thin linear hypoechoic connection. No abnormal appearing LEFT axillary lymph nodes are noted. IMPRESSION: 1. 8 cm group of highly suspicious UPPER-OUTER LEFT breast calcifications with 4.5 cm mass identified sonographically along the posterior aspect of the calcifications and a 2.3 cm mass identified sonographically along the anterior aspect of these calcifications. Tissue sampling of the posterior aspect of the 1-2 o'clock position mass 5 cm from the nipple in the anterior aspect of the 2.3 cm 2 o'clock position mass 3 cm from the nipple is recommended. 2. No abnormal appearing LEFT axillary lymph nodes. 3. No suspicious mammographic findings within the RIGHT breast. RECOMMENDATION: Ultrasound-guided biopsy of posterior aspect of 4.5 cm 1-2 o'clock position LEFT breast mass 5 cm from the nipple and ultrasound-guided biopsy of anterior aspect of 2.3 cm 2 o'clock position LEFT breast mass 3 cm from the nipple. I have discussed the findings and recommendations with the patient. If applicable, a reminder letter will be sent to the patient regarding the next appointment. BI-RADS CATEGORY  5: Highly suggestive of malignancy. Electronically Signed   By: Reyes Phi M.D.   On: 06/12/2024 14:05    Assessment and plan- Patient is a 73 y.o. 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  These 2 masses spanned a distance of 6.8 cm and have a thin linear hypoechoic connection.  No abnormal appearing left axillary lymph nodes.  Both these masses were biopsied and the 4.5 cm mass was grade 3 invasive mammary carcinoma ER/PR negative HER2 positive.  Hormone receptor testing was not performed on the second breast mass.  Given that there is evidence of pleomorphic calcifications in between these 2 breast masses additional areas of malignancy cannot be ruled out.  Therefore clinical staging would be a T3 N0.  Patient already had a CT chest abdomen and pelvis with contrast in July 2025 which did not show any evidence of distant metastatic disease and I am not repeating another CT at this time.  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  I would recommend 6 cycles of Taxotere carboplatin Herceptin and Perjeta given IV every 3 weeks for 6 cycles.  Discussed risks and benefits of chemotherapy including all but not limited to nausea vomiting low blood counts risk of infections and hospitalization as well as skin rash cardiotoxicity and diarrhea associated with Herceptin and Perjeta.  Treatment will be given with a curative intent.  Will plan for port placement by Dr. Tye as well as chemo teach.  She will need a baseline echocardiogram.  Treatment will tentatively start in 2 weeks time.  The phase II TRYPHAENA trial assessed cardiotoxicity related to the timing of administration of trastuzumab and pertuzumab with an  anthracycline-based chemotherapy regimen or an anthracycline-free regimen. Over 200 women with HER2-positive breast cancer were randomly assigned to [facility] followed by docetaxel, with trastuzumab and pertuzumab starting either concurrently with FEC  (FECHP-THP) or upon initiation of docetaxel (FEC-THP), or to docetaxel, carboplatin, trastuzumab, and pertuzumab (TCHP) [29]. The study was not powered to compare pCR rates between the treatment arms, and did not include a non-pertuzumab-containing arm. Results were as follows:  .pCR rates were 56 and 55 percent for FECHP-THP and FEC-THP, respectively (thus failing to demonstrate a pCR benefit for concurrent administration of the HER2-targeted agents with the anthracycline-containing portion of the NACT regimen), and 64 percent for TCHP.   If patient achieves a pathological complete response with TCHP chemotherapy then she will continue with Herceptin and Perjeta postsurgery to complete 1 year of treatment.  If she does not achieve a PCR with neoadjuvant chemotherapy we will plan to switch her to adjuvant Kadcyla at that time.  Patient will be seeing Dr. Tye to discuss surgical options.  Given the extent of her primary breast disease she may land up requiring left mastectomy and sentinel lymph node biopsy.  It is also likely that patient will need adjuvant radiation therapy postmastectomy but I will defer this to radiation oncology down the line.  Treatment will be given with a curative intent.  I am also getting baseline MRI bilateral breast with and without contrast.  I am referring her for genetic counseling.   Cancer Staging  Malignant neoplasm of upper-outer quadrant of left breast in female, estrogen receptor negative (HCC) Staging form: Breast, AJCC 8th Edition - Clinical stage from 06/30/2024: Stage IIB (cT3, cN0, cM0, G3, ER-, PR-, HER2+) - Signed by Melanee Annah BROCKS, MD on 06/30/2024 Stage prefix: Initial diagnosis Histologic grading system: 3 grade system     Thank you for this kind referral and the opportunity to participate in the care of this  Patient   Visit Diagnosis 1. Malignant neoplasm of upper-outer quadrant of left breast in female, estrogen receptor negative (HCC)   2.  Encounter for monitoring cardiotoxic drug therapy   3. Goals of care, counseling/discussion     Dr. Annah Melanee, MD, MPH [facility] [phone] 06/30/2024

## 2024-07-01 ENCOUNTER — Encounter: Payer: Self-pay | Admitting: Oncology

## 2024-07-01 MED ORDER — LIDOCAINE-PRILOCAINE 2.5-2.5 % EX CREA: TOPICAL_CREAM | CUTANEOUS | 3 refills | Status: DC

## 2024-07-01 MED ORDER — ONDANSETRON HCL 8 MG PO TABS: 8.0000 mg | ORAL_TABLET | Freq: Three times a day (TID) | ORAL | 1 refills | Status: AC | PRN

## 2024-07-01 MED ORDER — DEXAMETHASONE 4 MG PO TABS: ORAL_TABLET | ORAL | 1 refills | Status: AC

## 2024-07-01 MED ORDER — PROCHLORPERAZINE MALEATE 10 MG PO TABS: 10.0000 mg | ORAL_TABLET | Freq: Four times a day (QID) | ORAL | 1 refills | Status: AC | PRN

## 2024-07-01 NOTE — Progress Notes (Signed)
 START ON PATHWAY REGIMEN - Breast     Cycle 1: A cycle is 21 days:     Pertuzumab      Trastuzumab-xxxx      Docetaxel      Carboplatin    Cycles 2 through 6: A cycle is every 21 days:     Pertuzumab      Trastuzumab-xxxx      Docetaxel      Carboplatin   **Always confirm dose/schedule in your pharmacy ordering system**  Patient Characteristics: Preoperative or Nonsurgical Candidate, M0 (Clinical Staging), Up to cT4c, Any N, M0, Neoadjuvant Therapy followed by Surgery, Invasive Disease, Chemotherapy, HER2 Positive, ER Negative Therapeutic Status: Preoperative or Nonsurgical Candidate, M0 (Clinical Staging) AJCC M Category: cM0 AJCC Grade: G3 ER Status: Negative (-) AJCC 8 Stage Grouping: IIB HER2 Status: Positive (+) AJCC T Category: cT3 AJCC N Category: cN0 PR Status: Negative (-) Breast Surgical Plan: Neoadjuvant Therapy followed by Surgery Intent of Therapy: Curative Intent, Discussed with Patient

## 2024-07-02 ENCOUNTER — Other Ambulatory Visit: Payer: Self-pay

## 2024-07-03 ENCOUNTER — Other Ambulatory Visit: Payer: Self-pay | Admitting: Oncology

## 2024-07-03 ENCOUNTER — Ambulatory Visit (HOSPITAL_COMMUNITY)
Admission: RE | Admit: 2024-07-03 | Discharge: 2024-07-03 | Disposition: A | Discharge status: Home / Self Care | Source: Ambulatory Visit | Attending: Oncology | Admitting: Oncology

## 2024-07-03 ENCOUNTER — Encounter: Payer: Self-pay | Admitting: *Deleted

## 2024-07-03 ENCOUNTER — Encounter: Payer: Self-pay | Admitting: Oncology

## 2024-07-03 DIAGNOSIS — Z79899 Other long term (current) drug therapy: Secondary | ICD-10-CM | POA: Diagnosis not present

## 2024-07-03 DIAGNOSIS — Z01818 Encounter for other preprocedural examination: Secondary | ICD-10-CM | POA: Insufficient documentation

## 2024-07-03 DIAGNOSIS — C50412 Malignant neoplasm of upper-outer quadrant of left female breast: Secondary | ICD-10-CM | POA: Diagnosis not present

## 2024-07-03 DIAGNOSIS — Z5181 Encounter for therapeutic drug level monitoring: Secondary | ICD-10-CM | POA: Insufficient documentation

## 2024-07-03 DIAGNOSIS — Z171 Estrogen receptor negative status [ER-]: Secondary | ICD-10-CM | POA: Diagnosis not present

## 2024-07-03 LAB — ECHOCARDIOGRAM COMPLETE
Area-P 1/2: 2.22 cm2
Calc EF: 66 %
S' Lateral: 2.3 cm
Single Plane A2C EF: 71.3 %
Single Plane A4C EF: 64 %

## 2024-07-03 NOTE — Progress Notes (Signed)
 Rescheduled appointment with Dr. Tye until tomorrow due to echo scheduled for today.

## 2024-07-04 ENCOUNTER — Ambulatory Visit: Admission: RE | Admit: 2024-07-04 | Source: Ambulatory Visit

## 2024-07-04 ENCOUNTER — Ambulatory Visit: Payer: Self-pay | Admitting: Surgery

## 2024-07-04 ENCOUNTER — Telehealth: Payer: Self-pay

## 2024-07-04 DIAGNOSIS — Z170 Estrogen receptor positive status [ER+]: Secondary | ICD-10-CM | POA: Diagnosis not present

## 2024-07-04 DIAGNOSIS — C50412 Malignant neoplasm of upper-outer quadrant of left female breast: Secondary | ICD-10-CM | POA: Diagnosis not present

## 2024-07-04 NOTE — Telephone Encounter (Signed)
 Created in error. disregard

## 2024-07-04 NOTE — Progress Notes (Signed)
 Pharmacist Chemotherapy Monitoring - Initial Assessment    Anticipated start date: 07/18/24   The following has been reviewed per standard work regarding the patient's treatment regimen: The patient's diagnosis, treatment plan and drug doses, and organ/hematologic function Lab orders and baseline tests specific to treatment regimen  The treatment plan start date, drug sequencing, and pre-medications Prior authorization status  Patient's documented medication list, including drug-drug interaction screen and prescriptions for anti-emetics and supportive care specific to the treatment regimen The drug concentrations, fluid compatibility, administration routes, and timing of the medications to be used The patient's access for treatment and lifetime cumulative dose history, if applicable  The patient's medication allergies and previous infusion related reactions, if applicable   Changes made to treatment plan:   Follow up needed:  Check if baseline echo done   Redell JINNY Gaskins, [facility], 07/04/2024  3:17 PM

## 2024-07-04 NOTE — H&P (Signed)
 Subjective:   CC: Malignant neoplasm of upper-outer quadrant of left breast in female, estrogen receptor positive (CMS/HHS-HCC) [C50.412, Z17.0] HPI:  referred by Jerona Darilyn Sayre, * for evaluation of above. Change was noted on recent CT scan, followed by mammograms as noted below. History of Present Illness    Past Medical History:  has a past medical history of Allergic state, Diabetes mellitus without complication (CMS/HHS-HCC), and Hyperlipidemia.  Past Surgical History:  has a past surgical history that includes complex repair back; Small Intestine Endoscopy; and Hysteroscopy Uterus Unlisted (Right).  Family History: family history includes No Known Problems in her father and mother.  Social History:  reports that she has never smoked. She has never used smokeless tobacco. She reports current alcohol use. She reports that she does not use drugs.  Current Medications: has a current medication list which includes the following prescription(s): acetaminophen , atenolol , docusate, multivitamin, simvastatin , triamcinolone , and venlafaxine .  Allergies:  Allergies as of 07/04/2024 - Reviewed 07/04/2024  Allergen Reaction Noted   Aspirin Other (See Comments) 04/22/2015   Naproxen sodium Other (See Comments) 04/22/2015    ROS:  A 15 point review of systems was performed and was negative except as noted in HPI   Objective:     BP 116/71   Pulse 71   Ht 162.6 cm (5' 4)   Wt 69.9 kg (154 lb)   BMI 26.43 kg/m   Constitutional :  No distress, cooperative, alert  Lymphatics/Throat:  Supple with no lymphadenopathy  Respiratory:  Clear to auscultation bilaterally  Cardiovascular:  Regular rate and rhythm  Gastrointestinal: Soft, non-tender, non-distended, no organomegaly.  Musculoskeletal: Steady gait and movement  Skin: Cool and moist  Psychiatric: Normal affect, non-agitated, not confused  Breast: Normal appearance and no palpable abnormality in bilateral breasts and axilla.   Chaperone present for exam.      LABS:  SURGICAL PATHOLOGY  [facility]  [address], [address]  [place], [place] [address]  Telephone [phone] or [phone] Fax [phone]   REPORT OF SURGICAL PATHOLOGY    Accession #: [id number]  Patient Name: Teresa [name], Teresa [name]  Visit # : [id number]   MRN: [id number]  Physician: Correne Krabbe  DOB/Age January 31, 1951 (Age: 73) Gender: F  Collected Date: 06/20/2024  Received Date: 06/20/2024   FINAL DIAGNOSIS        1. Breast, left, needle core biopsy, 2 o'clock, 7cmfn, ribbon :       - INVASIVE DUCTAL CARCINOMA, SEE NOTE       - DUCTAL CARCINOMA IN SITU, SOLID AND CRIBRIFORM TYPES, HIGH NUCLEAR GRADE, WITH       NECROSIS       - TUBULE FORMATION: SCORE 3       - NUCLEAR PLEOMORPHISM: SCORE 3       - MITOTIC COUNT: SCORE 3       - TOTAL SCORE: 9       - OVERALL GRADE: 3       - LYMPHOVASCULAR INVASION: NOT IDENTIFIED       - CANCER LENGTH: 16 MM       - CALCIFICATIONS: PRESENT       - OTHER FINDINGS: NOT IDENTIFIED        2. Breast, left, needle core biopsy, 2 o'clock, 3cmfn, coil :       - INVASIVE DUCTAL CARCINOMA, SEE NOTE       - DUCTAL CARCINOMA IN SITU, SOLID, CRIBRIFORM AND MICROPAPILLARY TYPES, HIGH  NUCLEAR GRADE, WITH NECROSIS       - TUBULE FORMATION: SCORE 3       - NUCLEAR PLEOMORPHISM: SCORE 3       - MITOTIC COUNT: SCORE 1       - TOTAL SCORE: 7       - OVERALL GRADE: 2       - LYMPHOVASCULAR INVASION: NOT IDENTIFIED       - CANCER LENGTH: 6 MM       - CALCIFICATIONS: PRESENT       - OTHER FINDINGS: NOT IDENTIFIED        Diagnosis Note : These results were communicated to Rock Hover, RN in the       [facility] on 06/21/2024.       ER, PR, and HER2 will be performed on block 1A and reported in an addendum.       This case underwent intradepartmental consultation and Dr. Janel concurs with       the interpretation.       ELECTRONIC SIGNATURE : Coronel Md,  Misti, Sports administrator, International aid/development worker   MICROSCOPIC DESCRIPTION   CASE COMMENTS  STAINS USED IN DIAGNOSIS:  H&E-2  H&E-3  H&E-4  H&E  *RECUT 1 SLIDE  H&E-2  H&E-3  H&E-4  H&E  Stains used in diagnosis 1 Her2 by IHC, 1 ER-ACIS, 1 PR-ACIS  IHC scores are reported using ASCO/CAP scoring criteria.  An IHC Score of 0 or  1+  is NEGATIVE for HER2, 3+ is POSITIVE for HER2, and 2+ is EQUIVOCAL.  Equivocal results are reflexed to either FISH or IHC testing. Specimens are  fixed in 10% Neutral Buffered Formalin for at least 6 hours and up to 72 hours.  These tests have not be validated on decalcified tissue.  Results should be  interpreted with caution given the possibility of false negative results on  decalcified specimens. Antibody Clone for HER2 is 4B5 (PATHWAY). Some of these  immunohistochemical stains may have been developed and the performance  characteristics determined by [facility].  Some may not have been  cleared or approved by the U.S. Food and Drug Administration.  The FDA has  determined that such clearance or approval is not necessary.  This test is used  for clinical purposes.  It should not be regarded as investigational or for  research.  This laboratory is certified under the Clinical Laboratory  Improvement Amendments of 1988 (CLIA-88) as qualified to perform high complexity  clinical laboratory testing.  Estrogen receptor (6F11), immunohistochemical stains are performed on formalin  fixed, paraffin embedded tissue using a 3,3-diaminobenzidine (DAB) chromogen  and Leica Bond Autostainer System.  The staining intensity of the nucleus is  scored manually and is reported as the percentage of tumor cell nuclei  demonstrating specific nuclear staining.Specimens are fixed in 10% Neutral  Buffered Formalin for at least 6 hours and up to 72 hours.  These tests have not  be validated on decalcified tissue.  Results should be interpreted with caution  given  the possibility of false negative results on decalcified specimens.  PR progesterone receptor (16), immunohistochemical stains are performed on  formalin fixed, paraffin embedded tissue using a 3,3-diaminobenzidine (DAB)  chromogen and Leica Bond Autostainer System.  The staining intensity of the  nucleus is scored manually and is reported as the percentage of tumor cell  nuclei demonstrating specific nuclear staining.Specimens are fixed in 10%  Neutral Buffered Formalin for at least 6 hours and up  to 72 hours. These tests  have not be validated on decalcified tissue.  Results should be interpreted with  caution given the possibility of false negative results on decalcified  specimens.   ADDENDUM  1) Breast, left needle core biopsy, 2 o'clock, 7 cmfn, ribbon  PROGNOSTIC INDICATORS   Results:  IMMUNOHISTOCHEMICAL AND MORPHOMETRIC ANALYSIS PERFORMED MANUALLY  The tumor cells are POSITIVE for Her2 (3+).  Estrogen Receptor:  0%, NEGATIVE  Progesterone Receptor:  0%, NEGATIVE  COMMENT:  The negative hormone receptor study(ies) in this case has an internal positive control.   REFERENCE RANGE ESTROGEN RECEPTOR  NEGATIVE     0%  POSITIVE       =>1%  REFERENCE RANGE PROGESTERONE RECEPTOR  NEGATIVE     0%  POSITIVE        =>1%  All controls stained appropriately  Belvie Come, John, Pathologist, Electronic Signature  ( Signed [id number])    CLINICAL HISTORY   SPECIMEN(S) OBTAINED  1. Breast, left, needle core biopsy, 2 O'clock, 7cmfn, Ribbon  2. Breast, left, needle core biopsy, 2 O'clock, 3cmfn, Coil   SPECIMEN COMMENTS:  1. TIF: 8:22 AM, CIT less than 30 sec; palpable left breast masses and  calcifications in a segmental distribution  2. TIF: 8:27 AM, CIT less than 30 sec  SPECIMEN CLINICAL INFORMATION:  1. Malignancy  2. Malignancy     Gross Description  1. Received in formalin labeled with the patient's name and left breast 2  o'clock 7 cm fn are four cores of tan  fibrofatty tissue ranging from 1.1 to 1.7  cm in length x 0.2 cm in diameter.The specimen is entirely submitted in 1A.       Time in formalin is 8:22 a.m. on 06/20/24.  CIT is less than 30 seconds.  2. Received in formalin labeled with the patient's name and left breast 2  o'clock 3 cm fn are four cores of tan-pink fibrofatty tissue ranging from 1.3 to  1.8 cm in length x 0.2 cm in diameter.The specimen is entirely submitted in 2A.       Time in formalin is 8:27 a.m. on 06/20/24.  CIT is less than 30 seconds.  (WC:kh       06/20/24)         Report signed out from the following location(s)  [facility]  [address] [place], [place] [address] CLIA #: [id number]   [facility]  [address] [place], [place] [address] CLIA #: [id number]    RADS: CLINICAL DATA:  Status post 2 site ultrasound-guided biopsy   EXAM:  3D DIAGNOSTIC LEFT MAMMOGRAM POST ULTRASOUND BIOPSY   COMPARISON:  Previous exam(s).   ACR Breast Density Category b: There are scattered areas of  fibroglandular density.   FINDINGS:  3D Mammographic images were obtained following ultrasound guided  biopsy of a mass at 2 o'clock 7 cm from nipple. The RIBBON biopsy  marking clip is in expected position at the site of biopsy. This is  at the superior posterior site of segmental calcifications.   3D Mammographic images were obtained following ultrasound guided  biopsy of a mass at 2 o'clock 3 cm from the nipple. The COIL biopsy  marking clip is in expected position at the site of biopsy. This is  at the anterior site of the segmental calcifications.   Biopsy clips span approximately 5.5 cm.   IMPRESSION:  1. Appropriate positioning of the RIBBON shaped biopsy marking clip  at the site of biopsy in the upper outer breast at posterior depth.  2. Appropriate positioning of the COIL shaped biopsy marking clip at  the site of biopsy in the upper outer breast at anterior depth.  3.  Biopsy clips are approximately 5.5 cm apart.   Final Assessment: Post Procedure Mammograms for Marker Placement    Electronically Signed    By: Corean Salter M.D.    On: 06/20/2024 08:59  CLINICAL DATA:  73 year old female presents with LEFT breast mass  identified on recent CT. Also for annual bilateral mammogram.   EXAM:  DIGITAL DIAGNOSTIC BILATERAL MAMMOGRAM WITH TOMOSYNTHESIS AND CAD;  ULTRASOUND LEFT BREAST LIMITED   TECHNIQUE:  Bilateral digital diagnostic mammography and breast tomosynthesis  was performed. The images were evaluated with computer-aided  detection. ; Targeted ultrasound examination of the left breast was  performed.   COMPARISON: Previous exam(s).   ACR Breast Density Category b: There are scattered areas of  fibroglandular density.   FINDINGS:  Full field views of both breasts and magnification and spot  compression views of the LEFT breast are performed.   An 8 cm group of pleomorphic calcifications is identified within the  UPPER OUTER LEFT breast. A 4.5 cm irregular mass identified with the  posterior aspect of these calcifications and a 2.3 cm masses  associated with the anterior aspect of these calcifications.   No other new or suspicious mammographic findings within either  breast identified.   Targeted ultrasound is performed, showing the following:   A 3.9 x 2.4 x 4.5 cm irregular hypoechoic mass containing  calcifications is centered at the 1-2 o'clock position of the LEFT  breast 5 cm from the nipple.   A 2.3 x 0.9 x 1 cm heterogeneous hypoechoic mass containing  calcifications is centered at the 2 o'clock position 3 cm from the  nipple.   These 2 masses span a distance of at least 6.8 cm and have a thin  linear hypoechoic connection.   No abnormal appearing LEFT axillary lymph nodes are noted.   IMPRESSION:  1. 8 cm group of highly suspicious UPPER-OUTER LEFT breast  calcifications with 4.5 cm mass identified  sonographically along the  posterior aspect of the calcifications and a 2.3 cm mass identified  sonographically along the anterior aspect of these calcifications.  Tissue sampling of the posterior aspect of the 1-2 o'clock position  mass 5 cm from the nipple in the anterior aspect of the 2.3 cm 2  o'clock position mass 3 cm from the nipple is recommended.  2. No abnormal appearing LEFT axillary lymph nodes.  3. No suspicious mammographic findings within the RIGHT breast.   RECOMMENDATION:  Ultrasound-guided biopsy of posterior aspect of 4.5 cm 1-2 o'clock  position LEFT breast mass 5 cm from the nipple and ultrasound-guided  biopsy of anterior aspect of 2.3 cm 2 o'clock position LEFT breast  mass 3 cm from the nipple.   I have discussed the findings and recommendations with the patient.  If applicable, a reminder letter will be sent to the patient  regarding the next appointment.   BI-RADS CATEGORY  5: Highly suggestive of malignancy.    Electronically Signed    By: Reyes Phi M.D.    On: 06/12/2024 14:05   Assessment:   Malignant neoplasm of upper-outer quadrant of left breast in female, estrogen receptor positive (CMS/HHS-HCC) [C50.412, Z17.0]  Plan:     1. Malignant neoplasm of upper-outer quadrant of left breast in female, estrogen receptor  positive (CMS/HHS-HCC) [C50.412, Z17.0]  Discussed the risk of surgery including recurrence, chronic pain, post-op infxn, poor/delayed wound healing, poor cosmesis, seroma, hematoma formation, and possible re-operation to address said risks. The risks of general anesthetic, if used, includes MI, CVA, sudden death or even reaction to anesthetic medications also discussed.  Typical post-op recovery time and possbility of activity restrictions were also discussed.  Alternatives include continued observation.  Benefits include possible symptom relief, pathologic evaluation, and/or curative excision.   The patient verbalized understanding and  all questions were answered to the patient's satisfaction.  2. Patient has elected to proceed with surgical treatment. Procedure will be scheduled.  Recommended neoadjuvant so will proceed with port placement.  [id number]  labs/images/medications/previous chart entries reviewed personally and relevant changes/updates noted above.

## 2024-07-04 NOTE — H&P (View-Only) (Signed)
 Subjective:   CC: Malignant neoplasm of upper-outer quadrant of left breast in female, estrogen receptor positive (CMS/HHS-HCC) [C50.412, Z17.0] HPI:  referred by Jerona Darilyn Sayre, * for evaluation of above. Change was noted on recent CT scan, followed by mammograms as noted below. History of Present Illness    Past Medical History:  has a past medical history of Allergic state, Diabetes mellitus without complication (CMS/HHS-HCC), and Hyperlipidemia.  Past Surgical History:  has a past surgical history that includes complex repair back; Small Intestine Endoscopy; and Hysteroscopy Uterus Unlisted (Right).  Family History: family history includes No Known Problems in her father and mother.  Social History:  reports that she has never smoked. She has never used smokeless tobacco. She reports current alcohol use. She reports that she does not use drugs.  Current Medications: has a current medication list which includes the following prescription(s): acetaminophen , atenolol , docusate, multivitamin, simvastatin , triamcinolone , and venlafaxine .  Allergies:  Allergies as of 07/04/2024 - Reviewed 07/04/2024  Allergen Reaction Noted   Aspirin Other (See Comments) 04/22/2015   Naproxen sodium Other (See Comments) 04/22/2015    ROS:  A 15 point review of systems was performed and was negative except as noted in HPI   Objective:     BP 116/71   Pulse 71   Ht 162.6 cm (5' 4)   Wt 69.9 kg (154 lb)   BMI 26.43 kg/m   Constitutional :  No distress, cooperative, alert  Lymphatics/Throat:  Supple with no lymphadenopathy  Respiratory:  Clear to auscultation bilaterally  Cardiovascular:  Regular rate and rhythm  Gastrointestinal: Soft, non-tender, non-distended, no organomegaly.  Musculoskeletal: Steady gait and movement  Skin: Cool and moist  Psychiatric: Normal affect, non-agitated, not confused  Breast: Normal appearance and no palpable abnormality in bilateral breasts and axilla.   Chaperone present for exam.      LABS:  SURGICAL PATHOLOGY  [facility]  [address], [address]  [place], [place] [address]  Telephone [phone] or [phone] Fax [phone]   REPORT OF SURGICAL PATHOLOGY    Accession #: [id number]  Patient Name: TAHJA, LIAO  Visit # : [id number]   MRN: [id number]  Physician: Correne Krabbe  DOB/Age January 31, 1951 (Age: 56) Gender: F  Collected Date: 06/20/2024  Received Date: 06/20/2024   FINAL DIAGNOSIS        1. Breast, left, needle core biopsy, 2 o'clock, 7cmfn, ribbon :       - INVASIVE DUCTAL CARCINOMA, SEE NOTE       - DUCTAL CARCINOMA IN SITU, SOLID AND CRIBRIFORM TYPES, HIGH NUCLEAR GRADE, WITH       NECROSIS       - TUBULE FORMATION: SCORE 3       - NUCLEAR PLEOMORPHISM: SCORE 3       - MITOTIC COUNT: SCORE 3       - TOTAL SCORE: 9       - OVERALL GRADE: 3       - LYMPHOVASCULAR INVASION: NOT IDENTIFIED       - CANCER LENGTH: 16 MM       - CALCIFICATIONS: PRESENT       - OTHER FINDINGS: NOT IDENTIFIED        2. Breast, left, needle core biopsy, 2 o'clock, 3cmfn, coil :       - INVASIVE DUCTAL CARCINOMA, SEE NOTE       - DUCTAL CARCINOMA IN SITU, SOLID, CRIBRIFORM AND MICROPAPILLARY TYPES, HIGH  NUCLEAR GRADE, WITH NECROSIS       - TUBULE FORMATION: SCORE 3       - NUCLEAR PLEOMORPHISM: SCORE 3       - MITOTIC COUNT: SCORE 1       - TOTAL SCORE: 7       - OVERALL GRADE: 2       - LYMPHOVASCULAR INVASION: NOT IDENTIFIED       - CANCER LENGTH: 6 MM       - CALCIFICATIONS: PRESENT       - OTHER FINDINGS: NOT IDENTIFIED        Diagnosis Note : These results were communicated to Rock Hover, RN in the       [facility] on 06/21/2024.       ER, PR, and HER2 will be performed on block 1A and reported in an addendum.       This case underwent intradepartmental consultation and Dr. Janel concurs with       the interpretation.       ELECTRONIC SIGNATURE : Coronel Md,  Misti, Sports administrator, International aid/development worker   MICROSCOPIC DESCRIPTION   CASE COMMENTS  STAINS USED IN DIAGNOSIS:  H&E-2  H&E-3  H&E-4  H&E  *RECUT 1 SLIDE  H&E-2  H&E-3  H&E-4  H&E  Stains used in diagnosis 1 Her2 by IHC, 1 ER-ACIS, 1 PR-ACIS  IHC scores are reported using ASCO/CAP scoring criteria.  An IHC Score of 0 or  1+  is NEGATIVE for HER2, 3+ is POSITIVE for HER2, and 2+ is EQUIVOCAL.  Equivocal results are reflexed to either FISH or IHC testing. Specimens are  fixed in 10% Neutral Buffered Formalin for at least 6 hours and up to 72 hours.  These tests have not be validated on decalcified tissue.  Results should be  interpreted with caution given the possibility of false negative results on  decalcified specimens. Antibody Clone for HER2 is 4B5 (PATHWAY). Some of these  immunohistochemical stains may have been developed and the performance  characteristics determined by [facility].  Some may not have been  cleared or approved by the U.S. Food and Drug Administration.  The FDA has  determined that such clearance or approval is not necessary.  This test is used  for clinical purposes.  It should not be regarded as investigational or for  research.  This laboratory is certified under the Clinical Laboratory  Improvement Amendments of 1988 (CLIA-88) as qualified to perform high complexity  clinical laboratory testing.  Estrogen receptor (6F11), immunohistochemical stains are performed on formalin  fixed, paraffin embedded tissue using a 3,3-diaminobenzidine (DAB) chromogen  and Leica Bond Autostainer System.  The staining intensity of the nucleus is  scored manually and is reported as the percentage of tumor cell nuclei  demonstrating specific nuclear staining.Specimens are fixed in 10% Neutral  Buffered Formalin for at least 6 hours and up to 72 hours.  These tests have not  be validated on decalcified tissue.  Results should be interpreted with caution  given  the possibility of false negative results on decalcified specimens.  PR progesterone receptor (16), immunohistochemical stains are performed on  formalin fixed, paraffin embedded tissue using a 3,3-diaminobenzidine (DAB)  chromogen and Leica Bond Autostainer System.  The staining intensity of the  nucleus is scored manually and is reported as the percentage of tumor cell  nuclei demonstrating specific nuclear staining.Specimens are fixed in 10%  Neutral Buffered Formalin for at least 6 hours and up  to 72 hours. These tests  have not be validated on decalcified tissue.  Results should be interpreted with  caution given the possibility of false negative results on decalcified  specimens.   ADDENDUM  1) Breast, left needle core biopsy, 2 o'clock, 7 cmfn, ribbon  PROGNOSTIC INDICATORS   Results:  IMMUNOHISTOCHEMICAL AND MORPHOMETRIC ANALYSIS PERFORMED MANUALLY  The tumor cells are POSITIVE for Her2 (3+).  Estrogen Receptor:  0%, NEGATIVE  Progesterone Receptor:  0%, NEGATIVE  COMMENT:  The negative hormone receptor study(ies) in this case has an internal positive control.   REFERENCE RANGE ESTROGEN RECEPTOR  NEGATIVE     0%  POSITIVE       =>1%  REFERENCE RANGE PROGESTERONE RECEPTOR  NEGATIVE     0%  POSITIVE        =>1%  All controls stained appropriately  Belvie Come, John, Pathologist, Electronic Signature  ( Signed [id number])    CLINICAL HISTORY   SPECIMEN(S) OBTAINED  1. Breast, left, needle core biopsy, 2 O'clock, 7cmfn, Ribbon  2. Breast, left, needle core biopsy, 2 O'clock, 3cmfn, Coil   SPECIMEN COMMENTS:  1. TIF: 8:22 AM, CIT less than 30 sec; palpable left breast masses and  calcifications in a segmental distribution  2. TIF: 8:27 AM, CIT less than 30 sec  SPECIMEN CLINICAL INFORMATION:  1. Malignancy  2. Malignancy     Gross Description  1. Received in formalin labeled with the patient's name and left breast 2  o'clock 7 cm fn are four cores of tan  fibrofatty tissue ranging from 1.1 to 1.7  cm in length x 0.2 cm in diameter.The specimen is entirely submitted in 1A.       Time in formalin is 8:22 a.m. on 06/20/24.  CIT is less than 30 seconds.  2. Received in formalin labeled with the patient's name and left breast 2  o'clock 3 cm fn are four cores of tan-pink fibrofatty tissue ranging from 1.3 to  1.8 cm in length x 0.2 cm in diameter.The specimen is entirely submitted in 2A.       Time in formalin is 8:27 a.m. on 06/20/24.  CIT is less than 30 seconds.  (WC:kh       06/20/24)         Report signed out from the following location(s)  [facility]  [address] [place], [place] [address] CLIA #: [id number]   [facility]  [address] [place], [place] [address] CLIA #: [id number]    RADS: CLINICAL DATA:  Status post 2 site ultrasound-guided biopsy   EXAM:  3D DIAGNOSTIC LEFT MAMMOGRAM POST ULTRASOUND BIOPSY   COMPARISON:  Previous exam(s).   ACR Breast Density Category b: There are scattered areas of  fibroglandular density.   FINDINGS:  3D Mammographic images were obtained following ultrasound guided  biopsy of a mass at 2 o'clock 7 cm from nipple. The RIBBON biopsy  marking clip is in expected position at the site of biopsy. This is  at the superior posterior site of segmental calcifications.   3D Mammographic images were obtained following ultrasound guided  biopsy of a mass at 2 o'clock 3 cm from the nipple. The COIL biopsy  marking clip is in expected position at the site of biopsy. This is  at the anterior site of the segmental calcifications.   Biopsy clips span approximately 5.5 cm.   IMPRESSION:  1. Appropriate positioning of the RIBBON shaped biopsy marking clip  at the site of biopsy in the upper outer breast at posterior depth.  2. Appropriate positioning of the COIL shaped biopsy marking clip at  the site of biopsy in the upper outer breast at anterior depth.  3.  Biopsy clips are approximately 5.5 cm apart.   Final Assessment: Post Procedure Mammograms for Marker Placement    Electronically Signed    By: Corean Salter M.D.    On: 06/20/2024 08:59  CLINICAL DATA:  73 year old female presents with LEFT breast mass  identified on recent CT. Also for annual bilateral mammogram.   EXAM:  DIGITAL DIAGNOSTIC BILATERAL MAMMOGRAM WITH TOMOSYNTHESIS AND CAD;  ULTRASOUND LEFT BREAST LIMITED   TECHNIQUE:  Bilateral digital diagnostic mammography and breast tomosynthesis  was performed. The images were evaluated with computer-aided  detection. ; Targeted ultrasound examination of the left breast was  performed.   COMPARISON: Previous exam(s).   ACR Breast Density Category b: There are scattered areas of  fibroglandular density.   FINDINGS:  Full field views of both breasts and magnification and spot  compression views of the LEFT breast are performed.   An 8 cm group of pleomorphic calcifications is identified within the  UPPER OUTER LEFT breast. A 4.5 cm irregular mass identified with the  posterior aspect of these calcifications and a 2.3 cm masses  associated with the anterior aspect of these calcifications.   No other new or suspicious mammographic findings within either  breast identified.   Targeted ultrasound is performed, showing the following:   A 3.9 x 2.4 x 4.5 cm irregular hypoechoic mass containing  calcifications is centered at the 1-2 o'clock position of the LEFT  breast 5 cm from the nipple.   A 2.3 x 0.9 x 1 cm heterogeneous hypoechoic mass containing  calcifications is centered at the 2 o'clock position 3 cm from the  nipple.   These 2 masses span a distance of at least 6.8 cm and have a thin  linear hypoechoic connection.   No abnormal appearing LEFT axillary lymph nodes are noted.   IMPRESSION:  1. 8 cm group of highly suspicious UPPER-OUTER LEFT breast  calcifications with 4.5 cm mass identified  sonographically along the  posterior aspect of the calcifications and a 2.3 cm mass identified  sonographically along the anterior aspect of these calcifications.  Tissue sampling of the posterior aspect of the 1-2 o'clock position  mass 5 cm from the nipple in the anterior aspect of the 2.3 cm 2  o'clock position mass 3 cm from the nipple is recommended.  2. No abnormal appearing LEFT axillary lymph nodes.  3. No suspicious mammographic findings within the RIGHT breast.   RECOMMENDATION:  Ultrasound-guided biopsy of posterior aspect of 4.5 cm 1-2 o'clock  position LEFT breast mass 5 cm from the nipple and ultrasound-guided  biopsy of anterior aspect of 2.3 cm 2 o'clock position LEFT breast  mass 3 cm from the nipple.   I have discussed the findings and recommendations with the patient.  If applicable, a reminder letter will be sent to the patient  regarding the next appointment.   BI-RADS CATEGORY  5: Highly suggestive of malignancy.    Electronically Signed    By: Reyes Phi M.D.    On: 06/12/2024 14:05   Assessment:   Malignant neoplasm of upper-outer quadrant of left breast in female, estrogen receptor positive (CMS/HHS-HCC) [C50.412, Z17.0]  Plan:     1. Malignant neoplasm of upper-outer quadrant of left breast in female, estrogen receptor  positive (CMS/HHS-HCC) [C50.412, Z17.0]  Discussed the risk of surgery including recurrence, chronic pain, post-op infxn, poor/delayed wound healing, poor cosmesis, seroma, hematoma formation, and possible re-operation to address said risks. The risks of general anesthetic, if used, includes MI, CVA, sudden death or even reaction to anesthetic medications also discussed.  Typical post-op recovery time and possbility of activity restrictions were also discussed.  Alternatives include continued observation.  Benefits include possible symptom relief, pathologic evaluation, and/or curative excision.   The patient verbalized understanding and  all questions were answered to the patient's satisfaction.  2. Patient has elected to proceed with surgical treatment. Procedure will be scheduled.  Recommended neoadjuvant so will proceed with port placement.  [address]  labs/images/medications/previous chart entries reviewed personally and relevant changes/updates noted above.

## 2024-07-05 ENCOUNTER — Other Ambulatory Visit: Payer: Self-pay

## 2024-07-05 ENCOUNTER — Inpatient Hospital Stay: Admitting: Licensed Clinical Social Worker

## 2024-07-05 ENCOUNTER — Encounter
Admission: RE | Admit: 2024-07-05 | Discharge: 2024-07-05 | Disposition: A | Discharge status: Home / Self Care | Source: Ambulatory Visit | Attending: Surgery | Admitting: Surgery

## 2024-07-05 VITALS — Ht 64.0 in | Wt 166.0 lb

## 2024-07-05 DIAGNOSIS — E1142 Type 2 diabetes mellitus with diabetic polyneuropathy: Secondary | ICD-10-CM

## 2024-07-05 DIAGNOSIS — Z01812 Encounter for preprocedural laboratory examination: Secondary | ICD-10-CM

## 2024-07-05 HISTORY — DX: Gastro-esophageal reflux disease without esophagitis: K21.9

## 2024-07-05 HISTORY — DX: Unspecified injury of head, initial encounter: S09.90XA

## 2024-07-05 HISTORY — DX: Type 2 diabetes mellitus without complications: E11.9

## 2024-07-05 NOTE — Pre-Procedure Instructions (Signed)
 This nurse spoke with Teresa [name] for majority of the PAT interview but he was unable to complete the interview because he stated he had to pick up a client he then allowed who he said was a neighbor to complete the interview.

## 2024-07-05 NOTE — Progress Notes (Signed)
 [facility] Clinical Social Work  Initial Assessment   Teresa [name] is a 73 y.o. year old female contacted by phone. Clinical Social Work was referred by medical provider for assessment of psychosocial needs.   SDOH (Social Determinants of Health) assessments performed: Yes   SDOH Screenings   Food Insecurity: No Food Insecurity (06/30/2024)  Housing: Low Risk  (06/30/2024)  Transportation Needs: No Transportation Needs (06/30/2024)  Utilities: Not At Risk (06/30/2024)  Depression (PHQ2-9): Low Risk  (06/30/2024)  Financial Resource Strain: Patient Declined (12/07/2023)   Received from [facility]  Social Connections: Patient Unable To Answer (05/14/2024)  Tobacco Use: Unknown (07/05/2024)    PHQ 2/9:    06/30/2024   11:12 AM 06/30/2024   10:55 AM 02/20/2015    8:34 AM  Depression screen PHQ 2/9  Decreased Interest 0 0 0  Down, Depressed, Hopeless 0 0 0  PHQ - 2 Score 0 0 0     Distress Screen completed: No    06/30/2024   11:00 AM  ONCBCN DISTRESS SCREENING  Screening Type Initial Screening  How much distress have you been experiencing in the past week? (0-10) 0      Family/Social Information:  Housing Arrangement: patient lives with her husband. Family members/support persons in your life? Family and Church.  Patient's daughter lives near her. Transportation concerns: no  Employment: Retired  Income source: Actor concerns: No Type of concern: None Food access concerns: no Religious or spiritual practice: [employer] is very supportive. Advanced directives: No Services Currently in place:  [facility].  Coping/ Adjustment to diagnosis: Patient understands treatment plan and what happens next? yes Concerns about diagnosis and/or treatment: I'm not especially worried about anything Patient reported stressors: Patient denied. Hopes and/or priorities: Family Patient enjoys sleeping. Current coping skills/ strengths:  Manufacturing systems engineer , General fund of knowledge , Motivation for treatment/growth , Religious Affiliation , and Supportive family/friends     SUMMARY: Current SDOH Barriers:  Patient denied.  Clinical Social Work Clinical Goal(s):  No clinical social work goals at this time  Interventions: Discussed common feeling and emotions when being diagnosed with cancer, and the importance of support during treatment Informed patient of the support team roles and support services at [facility] Provided CSW contact information and encouraged patient to call with any questions or concerns Provided patient with information about CSW role and services available.    Follow Up Plan: Patient will contact CSW with any support or resource needs Patient verbalizes understanding of plan: Yes    Macario CHRISTELLA Au, LCSW Clinical Social Worker [facility]

## 2024-07-05 NOTE — Patient Instructions (Addendum)
 Your procedure is scheduled on:  FRIDAY SEPTEMBER 12  Report to the Registration Desk on the 1st floor of the [facility] To find out your arrival time, please call [phone] between 1PM - 3PM on:  THURSDAY SEPTEMBER 11 If your arrival time is 6:00 am, do not arrive before that time as the [facility] entrance doors do not open until 6:00 am.  REMEMBER: Instructions that are not followed completely may result in serious medical risk, up to and including death; or upon the discretion of your surgeon and anesthesiologist your surgery may need to be rescheduled.  Do not eat food after midnight the night before surgery.  No gum chewing or hard candies.  You may however, drink WATER up to 2 hours before you are scheduled to arrive for your surgery. Do not drink anything within 2 hours of your scheduled arrival time.   One week prior to surgery: Stop Anti-inflammatories (NSAIDS) such as Advil , Aleve, Ibuprofen , Motrin , Naproxen, Naprosyn and Aspirin based products such as Excedrin, Goody's Powder, BC Powder. Stop ANY OVER THE COUNTER supplements until after surgery. cetirizine  (ZYRTEC )  Multiple Vitamin (MULTIVITAMIN WITH MINERALS)   You may however, continue to take Tylenol  if needed for pain up until the day of surgery.   Continue taking all of your other prescription medications up until the day of surgery.  ON THE DAY OF SURGERY DO NOT TAKE ANY MEDICATIONS    No Alcohol for 24 hours before or after surgery.  Do not use any recreational drugs for at least a week (preferably 2 weeks) before your surgery.  Please be advised that the combination of cocaine and anesthesia may have negative outcomes, up to and including death. If you test positive for cocaine, your surgery will be cancelled.  On the morning of surgery brush your teeth with toothpaste and water, you may rinse your mouth with mouthwash if you wish. Do not swallow any toothpaste or mouthwash.  Use CHG Soap as  directed on instruction sheet.  Do not wear jewelry, make-up, hairpins, clips or nail polish.  For welded (permanent) jewelry: bracelets, anklets, waist bands, etc.  Please have this removed prior to surgery.  If it is not removed, there is a chance that hospital personnel will need to cut it off on the day of surgery.  Do not wear lotions, powders, or perfumes.   Do not shave body hair from the neck down 48 hours before surgery.  Contact lenses, hearing aids and dentures may not be worn into surgery.  Do not bring valuables to the hospital. [facility] is not responsible for any missing/lost belongings or valuables.   Notify your doctor if there is any change in your medical condition (cold, fever, infection).  Wear comfortable clothing (specific to your surgery type) to the hospital.  After surgery, you can help prevent lung complications by doing breathing exercises.  Take deep breaths and cough every 1-2 hours.   If you are being discharged the day of surgery, you will not be allowed to drive home. You will need a responsible individual to drive you home and stay with you for 24 hours after surgery.   If you are taking public transportation, you will need to have a responsible individual with you.  Please call the Pre-admissions Testing Dept. at [phone] if you have any questions about these instructions.  Surgery Visitation Policy:  Patients having surgery or a procedure may have two visitors.  Children under the age of 39 must  have an adult with them who is not the patient.   Merchandiser, retail to address health-related social needs:  [email or web address]                                                                                                                Preparing for Surgery with CHLORHEXIDINE  GLUCONATE (CHG) Soap  Chlorhexidine  Gluconate (CHG) Soap  o An antiseptic cleaner that kills germs and bonds with the skin to continue  killing germs even after washing  o Used for showering the night before surgery and morning of surgery  Before surgery, you can play an important role by reducing the number of germs on your skin.  CHG (Chlorhexidine  gluconate) soap is an antiseptic cleanser which kills germs and bonds with the skin to continue killing germs even after washing.  Please do not use if you have an allergy to CHG or antibacterial soaps. If your skin becomes reddened/irritated stop using the CHG.  1. Shower the NIGHT BEFORE SURGERY and the MORNING OF SURGERY with CHG soap.  2. If you choose to wash your hair, wash your hair first as usual with your normal shampoo.  3. After shampooing, rinse your hair and body thoroughly to remove the shampoo.  4. Use CHG as you would any other liquid soap. You can apply CHG directly to the skin and wash gently with a scrungie or a clean washcloth.  5. Apply the CHG soap to your body only from the neck down. Do not use on open wounds or open sores. Avoid contact with your eyes, ears, mouth, and genitals (private parts). Wash face and genitals (private parts) with your normal soap.  6. Wash thoroughly, paying special attention to the area where your surgery will be performed.  7. Thoroughly rinse your body with warm water.  8. Do not shower/wash with your normal soap after using and rinsing off the CHG soap.  9. Pat yourself dry with a clean towel.  10. Wear clean pajamas to bed the night before surgery.  11. Place clean sheets on your bed the night of your first shower and do not sleep with pets.  12. Shower again with the CHG soap on the day of surgery prior to arriving at the hospital.  13. Do not apply any deodorants/lotions/powders.  14. Please wear clean clothes to the hospital.

## 2024-07-06 ENCOUNTER — Encounter: Payer: Self-pay | Admitting: Oncology

## 2024-07-06 ENCOUNTER — Other Ambulatory Visit: Payer: Self-pay | Admitting: Oncology

## 2024-07-06 DIAGNOSIS — Z171 Estrogen receptor negative status [ER-]: Secondary | ICD-10-CM

## 2024-07-06 MED ORDER — ORAL CARE MOUTH RINSE: 15.0000 mL | Freq: Once | OROMUCOSAL | Status: AC

## 2024-07-06 MED ORDER — SODIUM CHLORIDE 0.9 % IV SOLN
INTRAVENOUS | Status: DC
Start: 2024-07-06 — End: 2024-07-07

## 2024-07-06 MED ORDER — CHLORHEXIDINE GLUCONATE CLOTH 2 % EX PADS: 6.0000 | MEDICATED_PAD | Freq: Once | CUTANEOUS | Status: DC

## 2024-07-06 MED ORDER — CEFAZOLIN SODIUM-DEXTROSE 2-4 GM/100ML-% IV SOLN
2.0000 g | INTRAVENOUS | Status: AC
  Administered 2024-07-07: 2 g via INTRAVENOUS

## 2024-07-06 MED ORDER — CHLORHEXIDINE GLUCONATE 0.12 % MT SOLN
15.0000 mL | Freq: Once | OROMUCOSAL | Status: AC
Start: 2024-07-06 — End: 2024-07-07
  Administered 2024-07-07: 15 mL via OROMUCOSAL

## 2024-07-06 NOTE — Progress Notes (Signed)
 Pharmacist Chemotherapy Monitoring - Initial Assessment    Anticipated start date: 07/18/24   The following has been reviewed per standard work regarding the patient's treatment regimen: The patient's diagnosis, treatment plan and drug doses, and organ/hematologic function Lab orders and baseline tests specific to treatment regimen  The treatment plan start date, drug sequencing, and pre-medications Prior authorization status  Patient's documented medication list, including drug-drug interaction screen and prescriptions for anti-emetics and supportive care specific to the treatment regimen The drug concentrations, fluid compatibility, administration routes, and timing of the medications to be used The patient's access for treatment and lifetime cumulative dose history, if applicable  The patient's medication allergies and previous infusion related reactions, if applicable   Changes made to treatment plan:  Switch to phesgo per insurance requirement Loading dose of phesgo 1200/600 followed by 600/600 q21d  Follow up needed:  Pending authorization for treatment    Teresa [name], RPH, 07/06/2024  2:42 PM

## 2024-07-07 ENCOUNTER — Encounter: Payer: Self-pay | Admitting: Oncology

## 2024-07-07 ENCOUNTER — Other Ambulatory Visit: Payer: Self-pay

## 2024-07-07 ENCOUNTER — Ambulatory Visit

## 2024-07-07 ENCOUNTER — Encounter
Admission: RE | Disposition: A | Discharge status: Home / Self Care | Payer: Self-pay | Source: Home / Self Care | Attending: Surgery

## 2024-07-07 ENCOUNTER — Encounter: Payer: Self-pay | Admitting: Surgery

## 2024-07-07 ENCOUNTER — Ambulatory Visit
Admission: RE | Admit: 2024-07-07 | Discharge: 2024-07-07 | Disposition: A | Discharge status: Home / Self Care | Attending: Surgery | Admitting: Surgery

## 2024-07-07 ENCOUNTER — Ambulatory Visit: Admitting: Certified Registered"

## 2024-07-07 DIAGNOSIS — R7303 Prediabetes: Secondary | ICD-10-CM | POA: Diagnosis not present

## 2024-07-07 DIAGNOSIS — C50412 Malignant neoplasm of upper-outer quadrant of left female breast: Secondary | ICD-10-CM | POA: Insufficient documentation

## 2024-07-07 DIAGNOSIS — Z170 Estrogen receptor positive status [ER+]: Secondary | ICD-10-CM | POA: Insufficient documentation

## 2024-07-07 DIAGNOSIS — Z01812 Encounter for preprocedural laboratory examination: Secondary | ICD-10-CM

## 2024-07-07 DIAGNOSIS — E1142 Type 2 diabetes mellitus with diabetic polyneuropathy: Secondary | ICD-10-CM

## 2024-07-07 HISTORY — PX: PORTACATH PLACEMENT: SHX2246

## 2024-07-07 LAB — GLUCOSE, CAPILLARY: Glucose-Capillary: 100 mg/dL — ABNORMAL HIGH (ref 70–99)

## 2024-07-07 SURGERY — INSERTION, TUNNELED CENTRAL VENOUS DEVICE, WITH PORT
Anesthesia: General | Site: Chest | Laterality: Left

## 2024-07-07 MED ORDER — LABETALOL HCL 5 MG/ML IV SOLN
INTRAVENOUS | Status: AC
  Filled 2024-07-07: qty 4

## 2024-07-07 MED ORDER — LABETALOL HCL 5 MG/ML IV SOLN: 10.0000 mg | INTRAVENOUS | Status: DC | PRN

## 2024-07-07 MED ORDER — PROPOFOL 10 MG/ML IV BOLUS
INTRAVENOUS | Status: AC
  Filled 2024-07-07: qty 20

## 2024-07-07 MED ORDER — LIDOCAINE-EPINEPHRINE (PF) 1 %-1:200000 IJ SOLN
INTRAMUSCULAR | Status: AC
  Filled 2024-07-07: qty 30

## 2024-07-07 MED ORDER — FENTANYL CITRATE (PF) 100 MCG/2ML IJ SOLN: 25.0000 ug | INTRAMUSCULAR | Status: DC | PRN

## 2024-07-07 MED ORDER — ONDANSETRON HCL 4 MG/2ML IJ SOLN
INTRAMUSCULAR | Status: AC
Start: 2024-07-07 — End: 2024-07-07
  Filled 2024-07-07: qty 2

## 2024-07-07 MED ORDER — LABETALOL HCL 5 MG/ML IV SOLN
5.0000 mg | INTRAVENOUS | Status: DC | PRN
  Administered 2024-07-07: 5 mg via INTRAVENOUS

## 2024-07-07 MED ORDER — HEPARIN SOD (PORK) LOCK FLUSH 100 UNIT/ML IV SOLN
INTRAVENOUS | Status: DC | PRN
Start: 2024-07-07 — End: 2024-07-07
  Administered 2024-07-07: 500 [IU] via INTRAVENOUS

## 2024-07-07 MED ORDER — FENTANYL CITRATE (PF) 100 MCG/2ML IJ SOLN
INTRAMUSCULAR | Status: DC | PRN
  Administered 2024-07-07 (×3): 25 ug via INTRAVENOUS

## 2024-07-07 MED ORDER — MIDAZOLAM HCL 2 MG/2ML IJ SOLN
INTRAMUSCULAR | Status: AC
  Filled 2024-07-07: qty 2

## 2024-07-07 MED ORDER — BUPIVACAINE HCL (PF) 0.5 % IJ SOLN
INTRAMUSCULAR | Status: AC
  Filled 2024-07-07: qty 30

## 2024-07-07 MED ORDER — LIDOCAINE-EPINEPHRINE (PF) 1 %-1:200000 IJ SOLN
INTRAMUSCULAR | Status: DC | PRN
  Administered 2024-07-07: 7 mL via INTRAMUSCULAR

## 2024-07-07 MED ORDER — OXYCODONE HCL 5 MG PO TABS
5.0000 mg | ORAL_TABLET | Freq: Once | ORAL | Status: AC
  Administered 2024-07-07: 5 mg via ORAL

## 2024-07-07 MED ORDER — CHLORHEXIDINE GLUCONATE 0.12 % MT SOLN
OROMUCOSAL | Status: AC
  Filled 2024-07-07: qty 15

## 2024-07-07 MED ORDER — DOCUSATE SODIUM 100 MG PO CAPS
100.0000 mg | ORAL_CAPSULE | Freq: Two times a day (BID) | ORAL | 0 refills | Status: AC | PRN
Start: 2024-07-07 — End: 2024-07-17
  Filled 2024-07-07: qty 20, 10d supply, fill #0

## 2024-07-07 MED ORDER — DEXAMETHASONE SODIUM PHOSPHATE 10 MG/ML IJ SOLN
INTRAMUSCULAR | Status: AC
  Filled 2024-07-07: qty 1

## 2024-07-07 MED ORDER — FENTANYL CITRATE (PF) 100 MCG/2ML IJ SOLN
INTRAMUSCULAR | Status: AC
  Filled 2024-07-07: qty 2

## 2024-07-07 MED ORDER — DROPERIDOL 2.5 MG/ML IJ SOLN: 0.6250 mg | Freq: Once | INTRAMUSCULAR | Status: DC | PRN

## 2024-07-07 MED ORDER — HYDRALAZINE HCL 20 MG/ML IJ SOLN
10.0000 mg | Freq: Once | INTRAMUSCULAR | Status: AC
Start: 2024-07-07 — End: 2024-07-07
  Administered 2024-07-07: 10 mg via INTRAVENOUS

## 2024-07-07 MED ORDER — ONDANSETRON HCL 4 MG/2ML IJ SOLN
INTRAMUSCULAR | Status: DC | PRN
  Administered 2024-07-07: 4 mg via INTRAVENOUS

## 2024-07-07 MED ORDER — HYDRALAZINE HCL 20 MG/ML IJ SOLN
INTRAMUSCULAR | Status: AC
Start: 2024-07-07 — End: 2024-07-07
  Filled 2024-07-07: qty 1

## 2024-07-07 MED ORDER — LIDOCAINE HCL (CARDIAC) PF 100 MG/5ML IV SOSY
PREFILLED_SYRINGE | INTRAVENOUS | Status: DC | PRN
  Administered 2024-07-07: 80 mg via INTRAVENOUS

## 2024-07-07 MED ORDER — MIDAZOLAM HCL 2 MG/2ML IJ SOLN
INTRAMUSCULAR | Status: DC | PRN
  Administered 2024-07-07: 1 mg via INTRAVENOUS

## 2024-07-07 MED ORDER — HEPARIN SOD (PORK) LOCK FLUSH 100 UNIT/ML IV SOLN
INTRAVENOUS | Status: AC
  Filled 2024-07-07: qty 5

## 2024-07-07 MED ORDER — SODIUM CHLORIDE FLUSH 0.9 % IV SOLN
INTRAVENOUS | Status: DC | PRN
  Administered 2024-07-07: 20 mL via INTRAVENOUS

## 2024-07-07 MED ORDER — OXYCODONE HCL 5 MG PO TABS
ORAL_TABLET | ORAL | Status: AC
  Filled 2024-07-07: qty 1

## 2024-07-07 MED ORDER — TRAMADOL HCL 50 MG PO TABS
50.0000 mg | ORAL_TABLET | Freq: Three times a day (TID) | ORAL | 0 refills | Status: DC | PRN
  Filled 2024-07-07: qty 6, 2d supply, fill #0

## 2024-07-07 MED ORDER — PROPOFOL 10 MG/ML IV BOLUS
INTRAVENOUS | Status: DC | PRN
Start: 2024-07-07 — End: 2024-07-07
  Administered 2024-07-07: 30 mg via INTRAVENOUS
  Administered 2024-07-07: 50 mg via INTRAVENOUS
  Administered 2024-07-07: 100 mg via INTRAVENOUS

## 2024-07-07 MED ORDER — PROPOFOL 500 MG/50ML IV EMUL
INTRAVENOUS | Status: DC | PRN
Start: 2024-07-07 — End: 2024-07-07
  Administered 2024-07-07: 110 ug/kg/min via INTRAVENOUS

## 2024-07-07 MED ORDER — CEFAZOLIN SODIUM-DEXTROSE 2-4 GM/100ML-% IV SOLN
INTRAVENOUS | Status: AC
  Filled 2024-07-07: qty 100

## 2024-07-07 SURGICAL SUPPLY — 25 items
BAG DECANTER FOR FLEXI CONT (MISCELLANEOUS) ×1 IMPLANT
BLADE SURG SZ11 CARB STEEL (BLADE) ×1 IMPLANT
CLAMP SUTURE YELLOW 5 PAIRS (MISCELLANEOUS) ×1 IMPLANT
COVER LIGHT HANDLE STERIS (MISCELLANEOUS) ×2 IMPLANT
DERMABOND ADVANCED .7 DNX12 (GAUZE/BANDAGES/DRESSINGS) ×1 IMPLANT
DRAPE C-ARM XRAY 36X54 (DRAPES) ×1 IMPLANT
DRSG TEGADERM 4X4.75 (GAUZE/BANDAGES/DRESSINGS) ×1 IMPLANT
ELECTRODE REM PT RTRN 9FT ADLT (ELECTROSURGICAL) ×1 IMPLANT
GAUZE 4X4 16PLY ~~LOC~~+RFID DBL (SPONGE) IMPLANT
GLOVE BIOGEL PI IND STRL 7.0 (GLOVE) ×1 IMPLANT
GLOVE SURG SYN 6.5 PF PI (GLOVE) ×3 IMPLANT
GOWN STRL REUS W/ TWL LRG LVL3 (GOWN DISPOSABLE) ×3 IMPLANT
IV NS 500ML BAXH (IV SOLUTION) ×1 IMPLANT
KIT PORT INFUSION SMART 8FR (Port) ×1 IMPLANT
KIT TURNOVER KIT A (KITS) ×1 IMPLANT
LABEL OR SOLS (LABEL) ×1 IMPLANT
MANIFOLD NEPTUNE II (INSTRUMENTS) ×1 IMPLANT
PACK PORT-A-CATH (MISCELLANEOUS) ×1 IMPLANT
SPIKE FLUID TRANSFER (MISCELLANEOUS) ×1 IMPLANT
SUT MNCRL AB 4-0 PS2 18 (SUTURE) ×1 IMPLANT
SUT PROLENE 2 0 SH DA (SUTURE) ×1 IMPLANT
SUT VIC AB 3-0 SH 27X BRD (SUTURE) ×1 IMPLANT
SYR 10ML LL (SYRINGE) ×1 IMPLANT
TRAP FLUID SMOKE EVACUATOR (MISCELLANEOUS) ×1 IMPLANT
WATER STERILE IRR 500ML POUR (IV SOLUTION) ×1 IMPLANT

## 2024-07-07 NOTE — Op Note (Signed)
--  OP NOTE  DATE OF PROCEDURE: 07/07/2024   SURGEON: Tye  ANESTHESIA: LMA  PRE-OPERATIVE DIAGNOSIS: Breast cancer requring port for chemotherapy   POST-OPERATIVE DIAGNOSIS: same  PROCEDURE(S):  1.) Percutaneous access of right IJ vein under ultrasound guidance   2.) Insertion of tunneled right IJ central venous catheter with subcutaneous port  INTRAOPERATIVE FINDINGS: Patent easily compressible right IJ vein with appropriate respiratory variations and well-secured tunneled central venous catheter with subcutaneous port at completion of the procedure, heplocked after confirming ease of draw and push  ESTIMATED BLOOD LOSS: Minimal (<20 mL)   SPECIMENS: None   IMPLANTS: 110F tunneled Bard PowerPort central venous catheter with subcutaneous port  DRAINS: None   COMPLICATIONS: None apparent   CONDITION AT COMPLETION: Hemodynamically stable, awake   DISPOSITION: PACU   INDICATION(S) FOR PROCEDURE:  Patient is a 73 y.o. female who presented with above diagnosis.  All risks, benefits, and alternatives to above elective procedures were discussed with the patient, who elected to proceed, and informed consent was accordingly obtained at that time.  DETAILS OF PROCEDURE:  Patient was brought to the operative suite and appropriately identified. In Trendelenburg position, right IJ venous access site was prepped and draped in the usual sterile fashion, and following a timeout, percutaneous venous access was obtained under ultrasound guidance using Seldinger technique, by which local anesthetic was injected over the area, and access needle was inserted under direct ultrasound visualization through which soft guidewire was advanced with no resistance, over which access needle was withdrawn. Guidewire was secured, attention was directed to injection of local anesthetic along the planned tunnel site, 2-3 cm transverse right chest incision was made and confirmed to accommodate the subcutaneous port,  and flushed catheter was tunneled retrograde from the port site over to the vein access site. Insertion sheath was advanced over the guidewire, which was withdrawn along with the insertion sheath dilator with no resistance. The catheter was introduced through the sheath and tip left in the [place] junction under fluoro guidance and catheter cut to appropriate length.  Catheter connected to port and placed within planned fixation site.  Fluro confirmed a somewhat sharp angle at the venous insertion site but easy flush and pull confirmed.  Port fixed to the pocket on two side to avoid twisting. Port again was confirmed to withdraw blood and flush easily with included Heuber needle, and port heplocked. Dermis at the subcutaneous pocket was re-approximated using buried interrupted 3-0 Vicryl suture, and 4-0 Monocryl suture was used to re-approximate skin at the insertion and subcutaneous port sites in running subcuticular fashion.  Incisions then dressed with dermabond. Patient was then awakened from anesthesia and transferred to PACU in stable condition.  US  images saved in paper chart and fluoro images saved in Epic.  CXR post op confirmed proper placement of port and no evidence of pneumothorax.

## 2024-07-07 NOTE — Transfer of Care (Signed)
 Immediate Anesthesia Transfer of Care Note  Patient: Teresa [name]  Procedure(s) Performed: INSERTION, TUNNELED CENTRAL VENOUS DEVICE, WITH PORT (Left: Chest)  Patient Location: PACU  Anesthesia Type:General  Level of Consciousness: drowsy  Airway & Oxygen Therapy: Patient Spontanous Breathing and Patient connected to face mask oxygen  Post-op Assessment: Report given to RN and Post -op Vital signs reviewed and stable  Post vital signs: Reviewed and stable  Last Vitals:  Vitals Value Taken Time  BP    Temp    Pulse    Resp    SpO2      Last Pain:  Vitals:   07/07/24 0937  TempSrc: Temporal  PainSc: 0-No pain         Complications: No notable events documented.

## 2024-07-07 NOTE — Interval H&P Note (Signed)
 No change. OK to proceed.

## 2024-07-07 NOTE — Anesthesia Preprocedure Evaluation (Signed)
 Anesthesia Evaluation  Patient identified by MRN, date of birth, ID band Patient awake    Reviewed: Allergy & Precautions, H&P , NPO status , Patient's Chart, lab work & pertinent test results, reviewed documented beta blocker date and time   History of Anesthesia Complications Negative for: history of anesthetic complications  Airway Mallampati: III  TM Distance: >3 FB Neck ROM: full    Dental  (+) Dental Advidsory Given, Teeth Intact, Missing   Pulmonary neg pulmonary ROS   Pulmonary exam normal breath sounds clear to auscultation       Cardiovascular Exercise Tolerance: Good negative cardio ROS Normal cardiovascular exam Rhythm:regular Rate:Normal     Neuro/Psych  PSYCHIATRIC DISORDERS Anxiety Depression    negative neurological ROS     GI/Hepatic Neg liver ROS,GERD (history of)  ,,  Endo/Other  diabetes (borderline)    Renal/GU negative Renal ROS  negative genitourinary   Musculoskeletal   Abdominal   Peds  Hematology negative hematology ROS (+)   Anesthesia Other Findings Past Medical History: 05/13/2024: Acute encephalopathy No date: Allergic rhinitis No date: Anxiety 05/13/2024: B12 deficiency 04/02/2014: Cystitis, acute No date: Depression No date: Diabetes mellitus without complication (HCC) No date: GERD (gastroesophageal reflux disease) No date: Head injury 10/06/2012: History of falling No date: Hyperlipidemia 02/20/2015: Lumbar radiculopathy 06/30/2024: Malignant neoplasm of upper-outer quadrant of left breast  in female, estrogen receptor negative (HCC) 05/13/2024: SIRS (systemic inflammatory response syndrome) (HCC) 10/06/2012: Status post lumbar laminectomy   Reproductive/Obstetrics negative OB ROS                              Anesthesia Physical Anesthesia Plan  ASA: 2  Anesthesia Plan: General   Post-op Pain Management:    Induction:  Intravenous  PONV Risk Score and Plan: Dexamethasone , Ondansetron  and Treatment may vary due to age or medical condition  Airway Management Planned: LMA  Additional Equipment:   Intra-op Plan:   Post-operative Plan: Extubation in OR  Informed Consent: I have reviewed the patients History and Physical, chart, labs and discussed the procedure including the risks, benefits and alternatives for the proposed anesthesia with the patient or authorized representative who has indicated his/her understanding and acceptance.     Dental Advisory Given  Plan Discussed with: Anesthesiologist, CRNA and Surgeon  Anesthesia Plan Comments:         Anesthesia Quick Evaluation

## 2024-07-07 NOTE — Anesthesia Postprocedure Evaluation (Signed)
 Anesthesia Post Note  Patient: Teresa [name]  Procedure(s) Performed: INSERTION, TUNNELED CENTRAL VENOUS DEVICE, WITH PORT (Left: Chest)  Patient location during evaluation: PACU Anesthesia Type: General Level of consciousness: awake and alert, oriented and patient cooperative Pain management: pain level controlled Vital Signs Assessment: post-procedure vital signs reviewed and stable Respiratory status: spontaneous breathing, nonlabored ventilation and respiratory function stable Cardiovascular status: blood pressure returned to baseline and stable Postop Assessment: adequate PO intake Anesthetic complications: no   No notable events documented.   Last Vitals:  Vitals:   07/07/24 1300 07/07/24 1315  BP: (!) 182/89 (!) 184/93  Pulse: 69 69  Resp: (!) 23 (!) 24  Temp:    SpO2: 100% 100%    Last Pain:  Vitals:   07/07/24 0937  TempSrc: Temporal  PainSc: 0-No pain                 Alfonso Ruths

## 2024-07-07 NOTE — Anesthesia Procedure Notes (Signed)
 Procedure Name: LMA Insertion Date/Time: 07/07/2024 11:36 AM  Performed by: Derick Jansky, RNPre-anesthesia Checklist: Emergency Drugs available, Patient identified, Suction available, Patient being monitored and Timeout performed Patient Re-evaluated:Patient Re-evaluated prior to induction Oxygen Delivery Method: Circle system utilized Preoxygenation: Pre-oxygenation with 100% oxygen Induction Type: IV induction LMA: LMA inserted LMA Size: 4.0 Number of attempts: 1 Placement Confirmation: positive ETCO2 and breath sounds checked- equal and bilateral Dental Injury: Teeth and Oropharynx as per pre-operative assessment

## 2024-07-07 NOTE — Discharge Instructions (Signed)
 Port, Care After This sheet gives you information about how to care for yourself after your procedure. Your health care provider may also give you more specific instructions. If you have problems or questions, contact your health care provider. What can I expect after the procedure? After the procedure, it is common to have: Soreness. Bruising. Itching. Follow these instructions at home: site care Follow instructions from your health care provider about how to take care of your site. Make sure you: Wash your hands with soap and water before and after you change your bandage (dressing). If soap and water are not available, use hand sanitizer. Leave stitches (sutures), skin glue, or adhesive strips in place. These skin closures may need to stay in place for 2 weeks or longer. If adhesive strip edges start to loosen and curl up, you may trim the loose edges. Do not remove adhesive strips completely unless your health care provider tells you to do that. If the area bleeds or bruises, apply gentle pressure for 10 minutes. OK TO SHOWER IN 24HRS  Check your site every day for signs of infection. Check for: Redness, swelling, or pain. Fluid or blood. Warmth. Pus or a bad smell.  General instructions Rest and then return to your normal activities as told by your health care provider.  tylenol  as needed for discomfort.   Use narcotics, if prescribed, only when tylenol  is not enough to control pain.  325-650mg  every 8hrs to max of 3000mg /24hrs (including the 325mg  in every norco dose) for the tylenol .   Keep all follow-up visits as told by your health care provider. This is important. Contact a health care provider if: You have redness, swelling, or pain around your site. You have fluid or blood coming from your site. Your site feels warm to the touch. You have pus or a bad smell coming from your site. You have a fever. Your sutures, skin glue, or adhesive strips loosen or come off sooner than  expected. Get help right away if: You have bleeding that does not stop with pressure or a dressing. Summary After the procedure, it is common to have some soreness, bruising, and itching at the site. Follow instructions from your health care provider about how to take care of your site. Check your site every day for signs of infection. Contact a health care provider if you have redness, swelling, or pain around your site, or your site feels warm to the touch. Keep all follow-up visits as told by your health care provider. This is important. This information is not intended to replace advice given to you by your health care provider. Make sure you discuss any questions you have with your health care provider. Document Released: 11/08/2015 Document Revised: 04/11/2018 Document Reviewed: 04/11/2018 Elsevier Interactive Patient Education  [employer].

## 2024-07-10 ENCOUNTER — Encounter
Admission: RE | Admit: 2024-07-10 | Discharge: 2024-07-10 | Disposition: A | Discharge status: Home / Self Care | Source: Ambulatory Visit | Attending: Oncology | Admitting: Oncology

## 2024-07-10 ENCOUNTER — Encounter: Payer: Self-pay | Admitting: Surgery

## 2024-07-10 DIAGNOSIS — Z171 Estrogen receptor negative status [ER-]: Secondary | ICD-10-CM | POA: Insufficient documentation

## 2024-07-10 DIAGNOSIS — C50412 Malignant neoplasm of upper-outer quadrant of left female breast: Secondary | ICD-10-CM | POA: Diagnosis not present

## 2024-07-10 MED ORDER — TECHNETIUM TC 99M MEDRONATE IV KIT
20.0000 | PACK | Freq: Once | INTRAVENOUS | Status: AC | PRN
  Administered 2024-07-10: 21.83 via INTRAVENOUS

## 2024-07-11 ENCOUNTER — Other Ambulatory Visit

## 2024-07-11 ENCOUNTER — Ambulatory Visit
Admission: RE | Admit: 2024-07-11 | Discharge: 2024-07-11 | Disposition: A | Discharge status: Home / Self Care | Source: Ambulatory Visit | Attending: Oncology | Admitting: Oncology

## 2024-07-11 DIAGNOSIS — C50412 Malignant neoplasm of upper-outer quadrant of left female breast: Secondary | ICD-10-CM | POA: Insufficient documentation

## 2024-07-11 DIAGNOSIS — Z171 Estrogen receptor negative status [ER-]: Secondary | ICD-10-CM | POA: Insufficient documentation

## 2024-07-11 MED ORDER — GADOBUTROL 1 MMOL/ML IV SOLN
7.0000 mL | Freq: Once | INTRAVENOUS | Status: AC | PRN
  Administered 2024-07-11: 7 mL via INTRAVENOUS

## 2024-07-12 ENCOUNTER — Ambulatory Visit: Admitting: Oncology

## 2024-07-12 ENCOUNTER — Other Ambulatory Visit

## 2024-07-12 ENCOUNTER — Ambulatory Visit

## 2024-07-13 ENCOUNTER — Inpatient Hospital Stay

## 2024-07-13 ENCOUNTER — Encounter: Payer: Self-pay | Admitting: Oncology

## 2024-07-13 DIAGNOSIS — C50412 Malignant neoplasm of upper-outer quadrant of left female breast: Secondary | ICD-10-CM | POA: Diagnosis not present

## 2024-07-13 DIAGNOSIS — Z171 Estrogen receptor negative status [ER-]: Secondary | ICD-10-CM | POA: Diagnosis not present

## 2024-07-13 DIAGNOSIS — R2681 Unsteadiness on feet: Secondary | ICD-10-CM | POA: Diagnosis not present

## 2024-07-13 DIAGNOSIS — I10 Essential (primary) hypertension: Secondary | ICD-10-CM | POA: Diagnosis not present

## 2024-07-13 NOTE — Progress Notes (Signed)
 [facility] CSW Progress Note  Clinical Social Work introduced self to patient during Patient Education with Raoul Moats, Charity fundraiser.  Provided information regarding CSW role, including counseling, advanced care planning and support group.  Answered questions as needed.  Follow Up Plan:  CSW will follow-up with patient by phone     Macario CHRISTELLA Au, LCSW Clinical Social Worker [facility]

## 2024-07-14 ENCOUNTER — Ambulatory Visit

## 2024-07-18 ENCOUNTER — Encounter: Payer: Self-pay | Admitting: *Deleted

## 2024-07-18 ENCOUNTER — Telehealth: Payer: Self-pay

## 2024-07-18 ENCOUNTER — Ambulatory Visit

## 2024-07-18 ENCOUNTER — Inpatient Hospital Stay

## 2024-07-18 ENCOUNTER — Encounter: Payer: Self-pay | Admitting: Oncology

## 2024-07-18 ENCOUNTER — Inpatient Hospital Stay (HOSPITAL_BASED_OUTPATIENT_CLINIC_OR_DEPARTMENT_OTHER): Admitting: Oncology

## 2024-07-18 VITALS — BP 154/78 | HR 85 | Resp 18

## 2024-07-18 VITALS — BP 137/77 | HR 96 | Temp 97.0°F | Resp 18 | Ht 64.0 in | Wt 169.5 lb

## 2024-07-18 DIAGNOSIS — Z171 Estrogen receptor negative status [ER-]: Secondary | ICD-10-CM | POA: Diagnosis not present

## 2024-07-18 DIAGNOSIS — Z5112 Encounter for antineoplastic immunotherapy: Secondary | ICD-10-CM

## 2024-07-18 DIAGNOSIS — Z5111 Encounter for antineoplastic chemotherapy: Secondary | ICD-10-CM

## 2024-07-18 DIAGNOSIS — C50412 Malignant neoplasm of upper-outer quadrant of left female breast: Secondary | ICD-10-CM | POA: Diagnosis not present

## 2024-07-18 DIAGNOSIS — Z1722 Progesterone receptor negative status: Secondary | ICD-10-CM | POA: Diagnosis not present

## 2024-07-18 DIAGNOSIS — Z1731 Human epidermal growth factor receptor 2 positive status: Secondary | ICD-10-CM | POA: Diagnosis not present

## 2024-07-18 LAB — CMP (CANCER CENTER ONLY)
ALT: 20 U/L (ref 0–44)
AST: 33 U/L (ref 15–41)
Albumin: 3.9 g/dL (ref 3.5–5.0)
Alkaline Phosphatase: 61 U/L (ref 38–126)
Anion gap: 12 (ref 5–15)
BUN: 22 mg/dL (ref 8–23)
CO2: 22 mmol/L (ref 22–32)
Calcium: 9.5 mg/dL (ref 8.9–10.3)
Chloride: 103 mmol/L (ref 98–111)
Creatinine: 0.73 mg/dL (ref 0.44–1.00)
GFR, Estimated: >60 mL/min (ref >60)
Glucose, Bld: 176 mg/dL — ABNORMAL HIGH (ref 70–99)
Potassium: 3.4 mmol/L — ABNORMAL LOW (ref 3.5–5.1)
Sodium: 137 mmol/L (ref 135–145)
Total Bilirubin: 0.5 mg/dL (ref 0.0–1.2)
Total Protein: 6.8 g/dL (ref 6.5–8.1)

## 2024-07-18 LAB — CBC WITH DIFFERENTIAL (CANCER CENTER ONLY)
Abs Immature Granulocytes: 0.07 K/uL (ref 0.00–0.07)
Basophils Absolute: 0 K/uL (ref 0.0–0.1)
Basophils Relative: 0 %
Eosinophils Absolute: 0 K/uL (ref 0.0–0.5)
Eosinophils Relative: 0 %
HCT: 37.3 % (ref 36.0–46.0)
Hemoglobin: 12.2 g/dL (ref 12.0–15.0)
Immature Granulocytes: 1 %
Lymphocytes Relative: 11 %
Lymphs Abs: 1.6 K/uL (ref 0.7–4.0)
MCH: 26.9 pg (ref 26.0–34.0)
MCHC: 32.7 g/dL (ref 30.0–36.0)
MCV: 82.2 fL (ref 80.0–100.0)
Monocytes Absolute: 0.4 K/uL (ref 0.1–1.0)
Monocytes Relative: 3 %
Neutro Abs: 11.8 K/uL — ABNORMAL HIGH (ref 1.7–7.7)
Neutrophils Relative %: 85 %
Platelet Count: 294 K/uL (ref 150–400)
RBC: 4.54 MIL/uL (ref 3.87–5.11)
RDW: 13.7 % (ref 11.5–15.5)
WBC Count: 13.8 K/uL — ABNORMAL HIGH (ref 4.0–10.5)
nRBC: 0 % (ref 0.0–0.2)

## 2024-07-18 MED ORDER — APREPITANT 130 MG/18ML IV EMUL
130.0000 mg | Freq: Once | INTRAVENOUS | Status: AC
  Administered 2024-07-18: 130 mg via INTRAVENOUS
  Filled 2024-07-18: qty 18

## 2024-07-18 MED ORDER — SODIUM CHLORIDE 0.9 % IV SOLN
75.0000 mg/m2 | Freq: Once | INTRAVENOUS | Status: AC
  Administered 2024-07-18: 139 mg via INTRAVENOUS
  Filled 2024-07-18: qty 13.9

## 2024-07-18 MED ORDER — ACETAMINOPHEN 325 MG PO TABS
650.0000 mg | ORAL_TABLET | Freq: Once | ORAL | Status: DC
  Filled 2024-07-18: qty 2

## 2024-07-18 MED ORDER — SODIUM CHLORIDE 0.9 % IV SOLN
432.5000 mg | Freq: Once | INTRAVENOUS | Status: AC
  Administered 2024-07-18: 430 mg via INTRAVENOUS
  Filled 2024-07-18: qty 43

## 2024-07-18 MED ORDER — DIPHENHYDRAMINE HCL 25 MG PO CAPS
50.0000 mg | ORAL_CAPSULE | Freq: Once | ORAL | Status: AC
  Administered 2024-07-18: 50 mg via ORAL
  Filled 2024-07-18: qty 2

## 2024-07-18 MED ORDER — PERTUZ-TRASTUZ-HYALURON-ZZXF 80-40-2000 MG-MG-U/ML CHEMO ~~LOC~~ SOLN
1200.0000 mg | Freq: Once | SUBCUTANEOUS | Status: AC
  Administered 2024-07-18: 1200 mg via SUBCUTANEOUS
  Filled 2024-07-18: qty 15

## 2024-07-18 MED ORDER — PALONOSETRON HCL INJECTION 0.25 MG/5ML
0.2500 mg | Freq: Once | INTRAVENOUS | Status: AC
  Administered 2024-07-18: 0.25 mg via INTRAVENOUS
  Filled 2024-07-18: qty 5

## 2024-07-18 MED ORDER — SODIUM CHLORIDE 0.9 % IV SOLN
INTRAVENOUS | Status: DC
  Filled 2024-07-18: qty 250

## 2024-07-18 MED ORDER — DEXAMETHASONE SODIUM PHOSPHATE 10 MG/ML IJ SOLN
10.0000 mg | Freq: Once | INTRAMUSCULAR | Status: AC
  Administered 2024-07-18: 10 mg via INTRAVENOUS
  Filled 2024-07-18: qty 1

## 2024-07-18 NOTE — Progress Notes (Signed)
 Hematology/Oncology Consult note [facility]  Telephone:([phone] [phone]  Patient Care Team: Diedra Lame, MD as PCP - General (Family Medicine) Georgina Shasta POUR, RN as Oncology Nurse Navigator Melanee Annah BROCKS, MD as Consulting Physician (Oncology)   Name of the patient: Teresa [name]  [id number]  02/22/1951   Date of visit: 07/18/24  Diagnosis-  Cancer Staging  Malignant neoplasm of upper-outer quadrant of left breast in female, estrogen receptor negative (HCC) Staging form: Breast, AJCC 8th Edition - Clinical stage from 06/30/2024: Stage IIB (cT3, cN0, cM0, G3, ER-, PR-, HER2+) - Signed by Melanee Annah BROCKS, MD on 06/30/2024 Stage prefix: Initial diagnosis Histologic grading system: 3 grade system    Chief complaint/ Reason for visit-on treatment assessment prior to cycle 1 of neoadjuvant TCHP chemotherapy  Heme/Onc history: patient is a 73 year old female with a past medical history significant for chronic back pain status post lumbar laminectomy, type 2 diabetes who presented to the ER with symptoms of bodyaches and underwent CT chest abdomen and pelvis with contrast which incidentally showed left breast mass but no evidence of metastatic disease.     Patient subsequently underwent mammogram and ultrasound which showed 1. 8 cm group of highly suspicious UPPER-OUTER LEFT breast calcifications with 4.5 cm mass identified sonographically along the posterior aspect of the calcifications and a 2.3 cm mass identified sonographically along the anterior aspect of these calcifications. Tissue sampling of the posterior aspect of the 1-2 o'clock position mass 5 cm from the nipple in the anterior aspect of the 2.3 cm 2 o'clock position mass 3 cm from the nipple is recommended.2. No abnormal appearing LEFT axillary lymph nodes.3. No suspicious mammographic findings within the RIGHT breast.   Patient underwent core biopsy of both the breast masses.  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  The 2.3 cm mass was positive for grade 2 invasive mammary carcinoma and receptor testing was not done on that specimen.  CT chest abdomen pelvis With contrast from July 2025 did not show any evidence of distant metastatic disease.  MRI bilateral breast also confirmed known findings from mammogram with total mass and non-mass enhancement extending up to 9 cm in the upper outer quadrant of the left breast.  No malignancy noted on the right side.  Bone scan showed nonspecific uptake in the sternomanubrial junction which could be subacute fracture versus arthropathy.  Baseline echocardiogram shows normal EF of 60 to 65%  Interval history-no acute issues since last visit.  She is sitting in a wheelchair because of her chronic back pain  ECOG PS- 2 Pain scale- 3   Review of systems- Review of Systems  Constitutional:  Negative for chills, fever, malaise/fatigue and weight loss.  HENT:  Negative for congestion, ear discharge and nosebleeds.   Eyes:  Negative for blurred vision.  Respiratory:  Negative for cough, hemoptysis, sputum production, shortness of breath and wheezing.   Cardiovascular:  Negative for chest pain, palpitations, orthopnea and claudication.  Gastrointestinal:  Negative for abdominal pain, blood in stool, constipation, diarrhea, heartburn, melena, nausea and vomiting.  Genitourinary:  Negative for dysuria, flank pain, frequency, hematuria and urgency.  Musculoskeletal:  Positive for back pain. Negative for joint pain and myalgias.  Skin:  Negative for rash.  Neurological:  Negative for dizziness, tingling, focal weakness, seizures, weakness and headaches.  Endo/Heme/Allergies:  Does not bruise/bleed easily.  Psychiatric/Behavioral:  Negative for depression and  suicidal ideas. The patient does not have insomnia.       Allergies  Allergen Reactions   Aleve [Naproxen Sodium]      Facial swelling   Aspirin     REACTION: nausea     Past Medical History:  Diagnosis Date   Acute encephalopathy 05/13/2024   Allergic rhinitis    Anxiety    B12 deficiency 05/13/2024   Cystitis, acute 04/02/2014   Depression    Diabetes mellitus without complication (HCC)    GERD (gastroesophageal reflux disease)    Head injury    History of falling 10/06/2012   Hyperlipidemia    Lumbar radiculopathy 02/20/2015   Malignant neoplasm of upper-outer quadrant of left breast in female, estrogen receptor negative (HCC) 06/30/2024   SIRS (systemic inflammatory response syndrome) (HCC) 05/13/2024   Status post lumbar laminectomy 10/06/2012     Past Surgical History:  Procedure Laterality Date   ABDOMINAL HYSTERECTOMY     BACK SURGERY     2000   BREAST BIOPSY Left 06/20/2024   u/s bx x2 clip ribbon and coil   BREAST BIOPSY Left 06/20/2024   US  LT BREAST BX W LOC DEV EA ADD LESION IMG BX SPEC US  GUIDE 06/20/2024 [facility]   BREAST BIOPSY Left 06/20/2024   US  LT BREAST BX W LOC DEV 1ST LESION IMG BX SPEC US  GUIDE 06/20/2024 [facility]   PORTACATH PLACEMENT Left 07/07/2024   Procedure: INSERTION, TUNNELED CENTRAL VENOUS DEVICE, WITH PORT;  Surgeon: Tye Millet, DO;  Location: [facility];  Service: General;  Laterality: Left;   TUBAL LIGATION      Social History   Socioeconomic History   Marital status: Married    Spouse name: Leatrice   Number of children: 3   Years of education: Not on file   Highest education level: Not on file  Occupational History    Comment: Works as Social worker  Tobacco Use   Smoking status: Never   Smokeless tobacco: Not on file  Vaping Use   Vaping status: Never Used  Substance and Sexual Activity   Alcohol use: Never   Drug use: Never   Sexual activity: Not Currently  Other Topics Concern   Not on file  Social History Narrative   ** Merged History Encounter **       Social Drivers of Health   Financial Resource Strain: Patient  Declined (12/07/2023)   Received from [facility]   Overall Financial Resource Strain (CARDIA)    Difficulty of Paying Living Expenses: Patient declined  Food Insecurity: No Food Insecurity (06/30/2024)   Hunger Vital Sign    Worried About Running Out of Food in the Last Year: Never true    Ran Out of Food in the Last Year: Never true  Transportation Needs: No Transportation Needs (06/30/2024)   PRAPARE - Administrator, Civil Service (Medical): No    Lack of Transportation (Non-Medical): No  Physical Activity: Not on file  Stress: Not on file  Social Connections: Patient Unable To Answer (05/14/2024)   Social Connection and Isolation Panel    Frequency of Communication with Friends and Family: Patient unable to answer    Frequency of Social Gatherings with Friends and Family: Patient unable to answer    Attends Religious Services: Patient unable to answer    Active Member of Clubs or Organizations: Patient unable to answer    Attends Banker Meetings: Patient unable to answer    Marital Status: Patient unable to answer  Intimate Partner Violence: Not At Risk (06/30/2024)   Humiliation, Afraid, Rape, and Kick questionnaire    Fear of Current or Ex-Partner: No    Emotionally Abused: No    Physically Abused: No    Sexually Abused: No    Family History  Problem Relation Age of Onset   Healthy Mother    Arthritis Sister    Stroke Sister    Arthritis Sister    Breast cancer Neg Hx      Current Outpatient Medications:    acetaminophen  (TYLENOL ) 500 MG tablet, Take 1 tablet (500 mg total) by mouth every 4 (four) hours as needed., Disp: 120 tablet, Rfl: 1   cetirizine  (ZYRTEC ) 10 MG tablet, Take 1 tablet (10 mg total) by mouth daily. (Patient taking differently: Take 10 mg by mouth daily as needed for allergies.), Disp: 30 tablet, Rfl: 3   dexamethasone  (DECADRON ) 4 MG tablet, Take 2 tabs by mouth 2 times daily starting day before chemo. Then take  2 tabs daily for 2 days starting day after chemo. Take with food., Disp: 30 tablet, Rfl: 1   lidocaine -prilocaine  (EMLA ) cream, Apply to affected area once, Disp: 30 g, Rfl: 3   Multiple Vitamin (MULTIVITAMIN WITH MINERALS) TABS tablet, Take 1 tablet by mouth daily., Disp: , Rfl:    ondansetron  (ZOFRAN ) 8 MG tablet, Take 1 tablet (8 mg total) by mouth every 8 (eight) hours as needed for nausea or vomiting. Start on the third day after chemotherapy., Disp: 30 tablet, Rfl: 1   prochlorperazine  (COMPAZINE ) 10 MG tablet, Take 1 tablet (10 mg total) by mouth every 6 (six) hours as needed for nausea or vomiting., Disp: 30 tablet, Rfl: 1   traMADol  (ULTRAM ) 50 MG tablet, Take 1 tablet (50 mg total) by mouth every 8 (eight) hours as needed., Disp: 6 tablet, Rfl: 0   venlafaxine  XR (EFFEXOR -XR) 75 MG 24 hr capsule, Take 75 mg by mouth daily., Disp: , Rfl:   Physical exam:  Vitals:   07/18/24 0902  BP: 137/77  Pulse: 96  Resp: 18  Temp: (!) 97 F (36.1 C)  TempSrc: Tympanic  SpO2: 98%  Weight: 169 lb 8 oz (76.9 kg)  Height: 5' 4 (1.626 m)   Physical Exam Cardiovascular:     Rate and Rhythm: Normal rate and regular rhythm.     Heart sounds: Normal heart sounds.  Pulmonary:     Effort: Pulmonary effort is normal.     Breath sounds: Normal breath sounds.  Skin:    General: Skin is warm and dry.  Neurological:     Mental Status: She is alert and oriented to person, place, and time.      I have personally reviewed labs listed below:    Latest Ref Rng & Units 07/18/2024    8:47 AM  CMP  Glucose 70 - 99 mg/dL 823   BUN 8 - 23 mg/dL 22   Creatinine 9.55 - 1.00 mg/dL 9.26   Sodium 864 - 854 mmol/L 137   Potassium 3.5 - 5.1 mmol/L 3.4   Chloride 98 - 111 mmol/L 103   CO2 22 - 32 mmol/L 22   Calcium 8.9 - 10.3 mg/dL 9.5   Total Protein 6.5 - 8.1 g/dL 6.8   Total Bilirubin 0.0 - 1.2 mg/dL 0.5   Alkaline Phos 38 - 126 U/L 61   AST 15 - 41 U/L 33   ALT 0 - 44 U/L 20       Latest Ref  Rng &  Units 07/18/2024    8:47 AM  CBC  WBC 4.0 - 10.5 K/uL 13.8   Hemoglobin 12.0 - 15.0 g/dL 87.7   Hematocrit 63.9 - 46.0 % 37.3   Platelets 150 - 400 K/uL 294     Assessment and plan- Patient is a 73 y.o. 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  She will be receiving carboplatin  at AUC 5 and docetaxel  at 75 mg/m.  Herceptin and Perjeta  will be given subcutaneously as Phesgo  injection as per insurance requirements.  I will see her back in 3 weeks for cycle 2.  Plan to repeat interim ultrasound after 3 cycles.  Growth factor support with every cycle.  Baseline echocardiogram is normal.  Discussed acetaminophens of chemotherapy including all but not limited to nausea vomiting low blood counts risk of infections hospitalizations and peripheral neuropathy and hair loss.  Risk of cardiotoxicity associated with anti-HER2 agents.  Treatment will be given with a curative intent.  Patient understands and agrees to proceed as planned.  Bone scan did not show any evidence of distant metastatic disease.  Nonspecific uptake in the manubrium sterni I which I will continue to monitor and consider getting a CT chest in about 3 to 4 months time before proceeding with surgery   She will be seen by covering NP in 10 days time with repeat labs for possible IV fluids.   Visit Diagnosis 1. Malignant neoplasm of upper-outer quadrant of left breast in female, estrogen receptor negative (HCC)   2. Encounter for monoclonal antibody treatment for malignancy   3. Encounter for antineoplastic chemotherapy      Dr. Annah Skene, MD, MPH [facility] at  [facility] [phone] 07/18/2024 9:35 AM

## 2024-07-18 NOTE — Patient Instructions (Addendum)
 [facility] - A DEPT OF [facility]  Discharge Instructions: Thank you for choosing [facility] to provide your oncology and hematology care.  If you have a lab appointment with the Cancer Center, please go directly to the Cancer Center and check in at the registration area.  Wear comfortable clothing and clothing appropriate for easy access to any Portacath or PICC line.   We strive to give you quality time with your provider. You may need to reschedule your appointment if you arrive late (15 or more minutes).  Arriving late affects you and other patients whose appointments are after yours.  Also, if you miss three or more appointments without notifying the office, you may be dismissed from the clinic at the provider's discretion.      For prescription refill requests, have your pharmacy contact our office and allow 72 hours for refills to be completed.    Today you received the following chemotherapy and/or immunotherapy agents phesgo  (trastuzumab/pertuzumab ), taxotere , carboplatin    To help prevent nausea and vomiting after your treatment, we encourage you to take your nausea medication as directed.  BELOW ARE SYMPTOMS THAT SHOULD BE REPORTED IMMEDIATELY: *FEVER GREATER THAN 100.4 F (38 C) OR HIGHER *CHILLS OR SWEATING *NAUSEA AND VOMITING THAT IS NOT CONTROLLED WITH YOUR NAUSEA MEDICATION *UNUSUAL SHORTNESS OF BREATH *UNUSUAL BRUISING OR BLEEDING *URINARY PROBLEMS (pain or burning when urinating, or frequent urination) *BOWEL PROBLEMS (unusual diarrhea, constipation, pain near the anus) TENDERNESS IN MOUTH AND THROAT WITH OR WITHOUT PRESENCE OF ULCERS (sore throat, sores in mouth, or a toothache) UNUSUAL RASH, SWELLING OR PAIN  UNUSUAL VAGINAL DISCHARGE OR ITCHING   Items with * indicate a potential emergency and should be followed up as soon as possible or go to the Emergency Department if any problems should occur.  Please show the  CHEMOTHERAPY ALERT CARD or IMMUNOTHERAPY ALERT CARD at check-in to the Emergency Department and triage nurse.  Should you have questions after your visit or need to cancel or reschedule your appointment, please contact [facility] - A DEPT OF [facility]  [phone] and follow the prompts.  Office hours are 8:00 a.m. to 4:30 p.m. Monday - Friday. Please note that voicemails left after 4:00 p.m. may not be returned until the following business day.  We are closed weekends and major holidays. You have access to a nurse at all times for urgent questions. Please call the main number to the clinic [phone] and follow the prompts.  For any non-urgent questions, you may also contact your provider using MyChart. We now offer e-Visits for anyone 43 and older to request care online for non-urgent symptoms. For details visit [email or web address].   Also download the MyChart app! Go to the app store, search MyChart, open the app, select [place], and log in with your MyChart username and password.   Pertuzumab ; Trastuzumab; Hyaluronidase Injection What is this medication? PERTUZUMAB ; TRASTUZUMAB; HYALURONIDASE (per TOOZ ue mab; tras TOO zoo mab; hye al ur ON i dase) treats breast cancer. Pertuzumab  and trastuzumab work by blocking a protein that causes cancer cells to grow and multiply. This helps to slow or stop the spread of cancer cells. Hyaluronidase works by increasing the absorption of other medications in the body to help them work better. It is a combination medication that contains two monoclonal antibodies. This medicine may be used for other purposes; ask your health care provider or pharmacist  if you have questions. COMMON BRAND NAME(S): PHESGO  What should I tell my care team before I take this medication? They need to know if you have any of these conditions: Heart failure High blood pressure Irregular heartbeat or rhythm Lung disease An unusual or  allergic reaction to pertuzumab , trastuzumab, hyaluronidase, other medications, foods, dyes, or preservatives Pregnant or trying to get pregnant Breast-feeding How should I use this medication? This medication is injected under the skin. It is usually given by your care team in a hospital or clinic setting. It may also be given at home by your care team. Talk to your care team about the use of this medication in children. Special care may be needed. Overdosage: If you think you have taken too much of this medicine contact a poison control center or emergency room at once. NOTE: This medicine is only for you. Do not share this medicine with others. What if I miss a dose? Keep appointments for follow-up doses. It is important not to miss your dose. Call your care team if you are unable to keep an appointment. What may interact with this medication? Certain types of chemotherapy, such as daunorubicin, doxorubicin, epirubicin, idarubicin This list may not describe all possible interactions. Give your health care provider a list of all the medicines, herbs, non-prescription drugs, or dietary supplements you use. Also tell them if you smoke, drink alcohol, or use illegal drugs. Some items may interact with your medicine. What should I watch for while using this medication? Your condition will be monitored carefully while you are receiving this medication. This medication may make you feel generally unwell. This is not uncommon as chemotherapy can affect healthy cells as well as cancer cells. Report any side effects. Continue your course of treatment even though you feel ill unless your care team tells you to stop. This medication may increase your risk of getting an infection. Call your care team for advice if you get a fever, chills, sore throat, or other symptoms of a cold or flu. Do not treat yourself. Try to avoid being around people who are sick. Avoid taking medications that contain aspirin,  acetaminophen , ibuprofen , naproxen, or ketoprofen unless instructed by your care team. These medications may hide a fever. Talk to your care team if you may be pregnant. Serious birth defects can occur if you take this medication during pregnancy and for 7 months after the last dose. You will need a negative pregnancy test before starting this medication. Contraception is recommended while taking this medication and for 7 months after the last dose. Your care team can help you find the option that works for you. Do not breastfeed while taking this medication and for 7 months after the last dose. What side effects may I notice from receiving this medication? Side effects that you should report to your care team as soon as possible: Allergic reactions or angioedema--skin rash, itching or hives, swelling of the face, eyes, lips, tongue, arms, or legs, trouble swallowing or breathing Dry cough, shortness of breath or trouble breathing Heart failure--shortness of breath, swelling of the ankles, feet, or hands, sudden weight gain, unusual weakness or fatigue Infection--fever, chills, cough, or sore throat Infusion reactions--chest pain, shortness of breath or trouble breathing, feeling faint or lightheaded Side effects that usually do not require medical attention (report these to your care team if they continue or are bothersome): Diarrhea Hair loss Nausea Pain, redness, or irritation at injection site Pain, redness, or swelling with sores inside the  mouth or throat Unusual weakness or fatigue This list may not describe all possible side effects. Call your doctor for medical advice about side effects. You may report side effects to FDA at 1-800-FDA-1088. Where should I keep my medication? This medication is given in a hospital or clinic. It will not be stored at home. NOTE: This sheet is a summary. It may not cover all possible information. If you have questions about this medicine, talk to your  doctor, pharmacist, or health care provider.  2024 Elsevier/Gold Standard (2022-02-27 00:00:00)  Docetaxel  Injection What is this medication? DOCETAXEL  (doe se TAX el) treats some types of cancer. It works by slowing down the growth of cancer cells. This medicine may be used for other purposes; ask your health care provider or pharmacist if you have questions. COMMON BRAND NAME(S): BEIZRAY , Docefrez , Docivyx , Taxotere  What should I tell my care team before I take this medication? They need to know if you have any of these conditions: Kidney disease Liver disease Low white blood cell levels Tingling of the fingers or toes or other nerve disorder An unusual or allergic reaction to docetaxel , polysorbate 80, other medications, foods, dyes, or preservatives Pregnant or trying to get pregnant Breast-feeding How should I use this medication? This medication is injected into a vein. It is given by your care team in a hospital or clinic setting. Talk to your care team about the use of this medication in children. Special care may be needed. Overdosage: If you think you have taken too much of this medicine contact a poison control center or emergency room at once. NOTE: This medicine is only for you. Do not share this medicine with others. What if I miss a dose? Keep appointments for follow-up doses. It is important not to miss your dose. Call your care team if you are unable to keep an appointment. What may interact with this medication? Do not take this medication with any of the following: Live virus vaccines This medication may also interact with the following: Certain antibiotics, such as clarithromycin, telithromycin Certain antivirals for HIV or hepatitis Certain medications for fungal infections, such as itraconazole, ketoconazole, voriconazole Grapefruit juice Nefazodone Supplements, such as St. John's wort This list may not describe all possible interactions. Give your health care  provider a list of all the medicines, herbs, non-prescription drugs, or dietary supplements you use. Also tell them if you smoke, drink alcohol, or use illegal drugs. Some items may interact with your medicine. What should I watch for while using this medication? This medication may make you feel generally unwell. This is not uncommon as chemotherapy can affect healthy cells as well as cancer cells. Report any side effects. Continue your course of treatment even though you feel ill unless your care team tells you to stop. You may need blood work done while you are taking this medication. This medication can cause serious side effects and infusion reactions. To reduce the risk, your care team may give you other medications to take before receiving this one. Be sure to follow the directions from your care team. This medication may increase your risk of getting an infection. Call your care team for advice if you get a fever, chills, sore throat, or other symptoms of a cold or flu. Do not treat yourself. Try to avoid being around people who are sick. Avoid taking medications that contain aspirin, acetaminophen , ibuprofen , naproxen, or ketoprofen unless instructed by your care team. These medications may hide a fever. Be careful brushing  or flossing your teeth or using a toothpick because you may get an infection or bleed more easily. If you have any dental work done, tell your dentist you are receiving this medication. Some products may contain alcohol. Ask your care team if this medication contains alcohol. Be sure to tell all care teams you are taking this medicine. Certain medications, like metronidazole  and disulfiram, can cause an unpleasant reaction when taken with alcohol. The reaction includes flushing, headache, nausea, vomiting, sweating, and increased thirst. The reaction can last from 30 minutes to several hours. This medication may affect your coordination, reaction time, or judgement. Do not drive  or operate machinery until you know how this medication affects you. Sit up or stand slowly to reduce the risk of dizzy or fainting spells. Drinking alcohol with this medication can increase the risk of these side effects. Talk to your care team about your risk of cancer. You may be more at risk for certain types of cancer if you take this medication. Talk to your care team if you wish to become pregnant or think you might be pregnant. This medication can cause serious birth defects if taken during pregnancy or if you get pregnant within 2 months after stopping therapy. A negative pregnancy test is required before starting this medication. A reliable form of contraception is recommended while taking this medication and for 2 months after stopping it. Talk to your care team about reliable forms of contraception. Do not breast-feed while taking this medication and for 1 week after stopping therapy. Use a condom during sex and for 4 months after stopping therapy. Tell your care team right away if you think your partner might be pregnant. This medication can cause serious birth defects. This medication may cause infertility. Talk to your care team if you are concerned about your fertility. What side effects may I notice from receiving this medication? Side effects that you should report to your care team as soon as possible: Allergic reactions--skin rash, itching, hives, swelling of the face, lips, tongue, or throat Change in vision such as blurry vision, seeing halos around lights, vision loss Infection--fever, chills, cough, or sore throat Infusion reactions--chest pain, shortness of breath or trouble breathing, feeling faint or lightheaded Low red blood cell level--unusual weakness or fatigue, dizziness, headache, trouble breathing Pain, tingling, or numbness in the hands or feet Painful swelling, warmth, or redness of the skin, blisters or sores at the infusion site Redness, blistering, peeling, or  loosening of the skin, including inside the mouth Sudden or severe stomach pain, bloody diarrhea, fever, nausea, vomiting Swelling of the ankles, hands, or feet Tumor lysis syndrome (TLS)--nausea, vomiting, diarrhea, decrease in the amount of urine, dark urine, unusual weakness or fatigue, confusion, muscle pain or cramps, fast or irregular heartbeat, joint pain Unusual bruising or bleeding Side effects that usually do not require medical attention (report to your care team if they continue or are bothersome): Change in nail shape, thickness, or color Change in taste Hair loss Increased tears This list may not describe all possible side effects. Call your doctor for medical advice about side effects. You may report side effects to FDA at 1-800-FDA-1088. Where should I keep my medication? This medication is given in a hospital or clinic. It will not be stored at home. NOTE: This sheet is a summary. It may not cover all possible information. If you have questions about this medicine, talk to your doctor, pharmacist, or health care provider.  2024 Elsevier/Gold Standard (2021-12-18 00:00:00)  Carboplatin  Injection What is this medication? CARBOPLATIN  (KAR boe pla tin) treats some types of cancer. It works by slowing down the growth of cancer cells. This medicine may be used for other purposes; ask your health care provider or pharmacist if you have questions. COMMON BRAND NAME(S): Paraplatin  What should I tell my care team before I take this medication? They need to know if you have any of these conditions: Blood disorders Hearing problems Kidney disease Recent or ongoing radiation therapy An unusual or allergic reaction to carboplatin , cisplatin, other medications, foods, dyes, or preservatives Pregnant or trying to get pregnant Breast-feeding How should I use this medication? This medication is injected into a vein. It is given by your care team in a hospital or clinic setting. Talk to  your care team about the use of this medication in children. Special care may be needed. Overdosage: If you think you have taken too much of this medicine contact a poison control center or emergency room at once. NOTE: This medicine is only for you. Do not share this medicine with others. What if I miss a dose? Keep appointments for follow-up doses. It is important not to miss your dose. Call your care team if you are unable to keep an appointment. What may interact with this medication? Medications for seizures Some antibiotics, such as amikacin, gentamicin, neomycin, streptomycin, tobramycin Vaccines This list may not describe all possible interactions. Give your health care provider a list of all the medicines, herbs, non-prescription drugs, or dietary supplements you use. Also tell them if you smoke, drink alcohol, or use illegal drugs. Some items may interact with your medicine. What should I watch for while using this medication? Your condition will be monitored carefully while you are receiving this medication. You may need blood work while taking this medication. This medication may make you feel generally unwell. This is not uncommon, as chemotherapy can affect healthy cells as well as cancer cells. Report any side effects. Continue your course of treatment even though you feel ill unless your care team tells you to stop. In some cases, you may be given additional medications to help with side effects. Follow all directions for their use. This medication may increase your risk of getting an infection. Call your care team for advice if you get a fever, chills, sore throat, or other symptoms of a cold or flu. Do not treat yourself. Try to avoid being around people who are sick. Avoid taking medications that contain aspirin, acetaminophen , ibuprofen , naproxen, or ketoprofen unless instructed by your care team. These medications may hide a fever. Be careful brushing or flossing your teeth or  using a toothpick because you may get an infection or bleed more easily. If you have any dental work done, tell your dentist you are receiving this medication. Talk to your care team if you wish to become pregnant or think you might be pregnant. This medication can cause serious birth defects. Talk to your care team about effective forms of contraception. Do not breast-feed while taking this medication. What side effects may I notice from receiving this medication? Side effects that you should report to your care team as soon as possible: Allergic reactions--skin rash, itching, hives, swelling of the face, lips, tongue, or throat Infection--fever, chills, cough, sore throat, wounds that don't heal, pain or trouble when passing urine, general feeling of discomfort or being unwell Low red blood cell level--unusual weakness or fatigue, dizziness, headache, trouble breathing Pain, tingling, or numbness in the hands  or feet, muscle weakness, change in vision, confusion or trouble speaking, loss of balance or coordination, trouble walking, seizures Unusual bruising or bleeding Side effects that usually do not require medical attention (report to your care team if they continue or are bothersome): Hair loss Nausea Unusual weakness or fatigue Vomiting This list may not describe all possible side effects. Call your doctor for medical advice about side effects. You may report side effects to FDA at 1-800-FDA-1088. Where should I keep my medication? This medication is given in a hospital or clinic. It will not be stored at home. NOTE: This sheet is a summary. It may not cover all possible information. If you have questions about this medicine, talk to your doctor, pharmacist, or health care provider.  2024 Elsevier/Gold Standard (2022-02-03 00:00:00)

## 2024-07-18 NOTE — Progress Notes (Signed)
 Nutrition Assessment   Reason for Assessment:   New chemothearpy   ASSESSMENT:  73 year old female with left breast cancer (ER/PR negative, HER2+).  Past medical history of HLD, DM, lumbar laminectomy, GERD, Vit B 12 deficiency.    Met with patient and husband during infusion.  Husband reports that he does all the cooking and has for the last several years following patient's back surgery.  Patient ate cheese grits this am with toast.  Last night for dinner ate chicken with rice.  Patient eats a variety of foods.      Medications: compazine , zofran , MVI, decadron    Labs: K 3.4, glucose 176   Anthropometrics:   Height: 64 inches Weight: 169 lb 8 oz  171 lb on 7/19  BMI: 29  Not significant weight loss   Estimated Energy Needs  Kcals: 1925 Protein: 96 g Fluid: 1925 ml   NUTRITION DIAGNOSIS: Food and nutrition knowledge deficit related to new cancer diagnosis as evidenced by discussion with RD regarding nutrition impact symptoms from treatment   INTERVENTION:  Discussed importance of nutrition during treatment. Reviewed some common nutrition impact symptoms.  Encouraged foods rich in protein.  Contact information provided   MONITORING, EVALUATION, GOAL: weight trends, intake   Next Visit: Tuesday, Oct 14 during infusion  Marleny Faller B. Dasie SOLON, CSO, LDN Registered Dietitian [phone]

## 2024-07-18 NOTE — Progress Notes (Signed)
 Patient has no new or acute concerns.

## 2024-07-18 NOTE — Telephone Encounter (Signed)
 Paperwork completed.  Left message informing patient daughter ready for pick up at front desk.

## 2024-07-18 NOTE — Telephone Encounter (Signed)
 FMLA request received for patient's daughter [name].  Confirmed with patient that Trenette will be helping with her care during treatments.

## 2024-07-18 NOTE — Telephone Encounter (Signed)
 Form completed, pending Dr. Melanee signature.

## 2024-07-18 NOTE — Progress Notes (Signed)
 Outbound call to Radiology [phone] spoke to Randine and indicated recent MRI done 07/11/24 would need to be addended; patient has left sided breast cancer and report is showing for malignancy on right side.  Report will be addended.

## 2024-07-19 ENCOUNTER — Telehealth: Payer: Self-pay

## 2024-07-19 ENCOUNTER — Encounter: Payer: Self-pay | Admitting: Oncology

## 2024-07-19 NOTE — Telephone Encounter (Signed)
Telephone call to patient for follow up after receiving first infusion.   Patient states infusion went great.  States eating good and drinking plenty of fluids.   Denies any nausea or vomiting.  Encouraged patient to call for any concerns or questions. 

## 2024-07-20 ENCOUNTER — Inpatient Hospital Stay

## 2024-07-20 ENCOUNTER — Telehealth: Payer: Self-pay

## 2024-07-20 ENCOUNTER — Telehealth: Payer: Self-pay | Admitting: Oncology

## 2024-07-20 DIAGNOSIS — Z5111 Encounter for antineoplastic chemotherapy: Secondary | ICD-10-CM | POA: Diagnosis not present

## 2024-07-20 DIAGNOSIS — Z171 Estrogen receptor negative status [ER-]: Secondary | ICD-10-CM

## 2024-07-20 MED ORDER — PEGFILGRASTIM-JMDB 6 MG/0.6ML ~~LOC~~ SOSY
6.0000 mg | PREFILLED_SYRINGE | SUBCUTANEOUS | Status: AC
  Administered 2024-07-20: 6 mg via SUBCUTANEOUS
  Filled 2024-07-20: qty 0.6

## 2024-07-20 NOTE — Telephone Encounter (Signed)
 Pt daughter calling and wants to know after the pt treatment will the pt have to have a lab/md/infusion and injection appt? [phone]

## 2024-07-20 NOTE — Telephone Encounter (Signed)
 Pt daughter Taylor accompanied pt today for her GCSF inj.  Teresa [name] is concerned with patient's increasing anxiety recently especially for any visit at medical facility.  She was wondering if Dr. Melanee could prescribe her something for anxiety.  Patient does take daily Venlafaxine  but is not controlling the anxiety.    Taylor, patient's daughter, confirms that she does communicate via MyChart.

## 2024-07-21 ENCOUNTER — Other Ambulatory Visit: Payer: Self-pay | Admitting: Oncology

## 2024-07-21 ENCOUNTER — Encounter: Payer: Self-pay | Admitting: Oncology

## 2024-07-21 ENCOUNTER — Other Ambulatory Visit: Payer: Self-pay

## 2024-07-21 MED ORDER — LORAZEPAM 0.5 MG PO TABS: 0.5000 mg | ORAL_TABLET | Freq: Three times a day (TID) | ORAL | 0 refills | Status: AC | PRN

## 2024-07-21 NOTE — Telephone Encounter (Signed)
 Please clarify with patients daughter what she means by this?  Next rx scheduled on 10/14?

## 2024-07-21 NOTE — Telephone Encounter (Signed)
 Rx pended and routed to MD.  Patient's daughter aware.

## 2024-07-21 NOTE — Telephone Encounter (Signed)
 Ok to send her prescription for ativan  0.5 mg Q8 prn for anxiety 30 tab no refills

## 2024-07-24 ENCOUNTER — Encounter: Payer: Self-pay | Admitting: Oncology

## 2024-07-24 NOTE — Telephone Encounter (Signed)
 Per Dr. Melanee Please clarify with patients daughter what she means by this?  Next rx scheduled on 10/14?SABRA  Previous message from daughter read Pt daughter calling and wants to know after the pt treatment will the pt have to have a lab/md/infusion and injection appt? [phone]. Voice message left.

## 2024-07-24 NOTE — Telephone Encounter (Signed)
 Spoke to [employer] who had questions if fluid clinic would always be followed after a provider encounter.  Informed that it's typical when seeing covering NP to have chair time set aside if hydration is needed.  Reviewed upcoming appointment with Teresa [name] and daughter inquired how pertinent it was to come this appointment reason being she will be out of town and if needed can speak with her father about bringing the patient to this appointment.  Daughter did not mention any immediate questions / concerns.  Informed I would touch base with Dr. Melanee since last OV note 07/18/24 indicated She will be seen by covering NP in 10 days time with repeat labs for possible IV fluids..  Informed daughter I would follow up shortly upon receiving further advisement; daughter verbalized understanding.

## 2024-07-26 ENCOUNTER — Inpatient Hospital Stay: Attending: Oncology | Admitting: Hospice and Palliative Medicine

## 2024-07-26 DIAGNOSIS — Z1722 Progesterone receptor negative status: Secondary | ICD-10-CM | POA: Insufficient documentation

## 2024-07-26 DIAGNOSIS — C50412 Malignant neoplasm of upper-outer quadrant of left female breast: Secondary | ICD-10-CM | POA: Insufficient documentation

## 2024-07-26 DIAGNOSIS — Z171 Estrogen receptor negative status [ER-]: Secondary | ICD-10-CM | POA: Insufficient documentation

## 2024-07-26 DIAGNOSIS — Z1731 Human epidermal growth factor receptor 2 positive status: Secondary | ICD-10-CM | POA: Insufficient documentation

## 2024-07-26 DIAGNOSIS — E876 Hypokalemia: Secondary | ICD-10-CM | POA: Insufficient documentation

## 2024-07-26 DIAGNOSIS — Z5111 Encounter for antineoplastic chemotherapy: Secondary | ICD-10-CM | POA: Insufficient documentation

## 2024-07-26 DIAGNOSIS — Z5189 Encounter for other specified aftercare: Secondary | ICD-10-CM | POA: Insufficient documentation

## 2024-07-26 NOTE — Telephone Encounter (Signed)
 Per Dr. Melanee If she's feeling well and doesn't find the need to come for IV fluid, she does not have to come.  Outbound call; spoke to daughter [name] who stated the patient mentioned she's just a little tired.  Daughter hasn't seen her since Sunday (is on her way now to go see her); father states she has been eating well.  Mentioned her arm was bothering her (hx arthritis even prior to diagnosis but mentioned could be the side effects of the injection).  Trenette has been out of town due to a death in the family on her husband's side.  Trenette will call us  back with an update on how patient's doing and will determine if symptom management appointment is indeed necessary.

## 2024-07-26 NOTE — Progress Notes (Signed)
 Multidisciplinary Oncology Council Documentation  Teresa [name] was presented by our [facility] on 07/26/2024, which included representatives from:  Palliative Care Dietitian  Physical/Occupational Therapist Nurse Navigator Genetics Social work Survivorship RN Financial Navigator Research RN   Teresa [name] currently presents with history of breast cancer  We reviewed previous medical and familial history, history of present illness, and recent lab results along with all available histopathologic and imaging studies. The MOC considered available treatment options and made the following recommendations/referrals:  Nutrition, SW, rehab  The MOC is a meeting of clinicians from various specialty areas who evaluate and discuss patients for whom a multidisciplinary approach is being considered. Final determinations in the plan of care are those of the provider(s).   Today's extended care, comprehensive team conference, Teresa [name] was not present for the discussion and was not examined.

## 2024-07-27 ENCOUNTER — Inpatient Hospital Stay

## 2024-07-27 NOTE — Telephone Encounter (Signed)
 Outbound call; spoke to daughter who states she ate breakfast, lunch and dinner yesterday but is still a little dragging (tired).  Yesterday patient denied pain.  Caregiver will call us  back at the end of the day with another update to determine if tomorrow's appointment is needed.

## 2024-07-27 NOTE — Telephone Encounter (Signed)
 Voicemail received from caregiver Trenette yesterday 07/26/24 at 4:46pm requesting a call back to [phone] to give an update on mother's status.

## 2024-07-27 NOTE — Progress Notes (Signed)
 [facility] CSW Progress Note  Clinical Child psychotherapist contacted patient by phone to follow-up on assessment of psychosocial needs from [facility].    Interventions: Provided patient with information about CSW role and services provided.  She stated she was fine and had no needs currently.       Follow Up Plan:  CSW will see patient on Friday, 10/3, while in infusion.    Macario CHRISTELLA Au, LCSW Clinical Social Worker [facility]

## 2024-07-28 ENCOUNTER — Inpatient Hospital Stay

## 2024-07-28 ENCOUNTER — Inpatient Hospital Stay: Admitting: Hospice and Palliative Medicine

## 2024-07-28 VITALS — BP 138/85 | HR 99 | Resp 18 | Ht 64.0 in | Wt 165.0 lb

## 2024-07-28 DIAGNOSIS — E876 Hypokalemia: Secondary | ICD-10-CM | POA: Diagnosis not present

## 2024-07-28 DIAGNOSIS — C50412 Malignant neoplasm of upper-outer quadrant of left female breast: Secondary | ICD-10-CM | POA: Diagnosis not present

## 2024-07-28 DIAGNOSIS — Z5189 Encounter for other specified aftercare: Secondary | ICD-10-CM | POA: Diagnosis not present

## 2024-07-28 DIAGNOSIS — Z5111 Encounter for antineoplastic chemotherapy: Secondary | ICD-10-CM | POA: Diagnosis present

## 2024-07-28 DIAGNOSIS — Z171 Estrogen receptor negative status [ER-]: Secondary | ICD-10-CM

## 2024-07-28 DIAGNOSIS — Z1722 Progesterone receptor negative status: Secondary | ICD-10-CM | POA: Diagnosis not present

## 2024-07-28 DIAGNOSIS — Z1731 Human epidermal growth factor receptor 2 positive status: Secondary | ICD-10-CM | POA: Diagnosis not present

## 2024-07-28 LAB — CBC WITH DIFFERENTIAL (CANCER CENTER ONLY)
Abs Immature Granulocytes: 3.94 K/uL — ABNORMAL HIGH (ref 0.00–0.07)
Basophils Absolute: 0 K/uL (ref 0.0–0.1)
Basophils Relative: 0 %
Eosinophils Absolute: 0.1 K/uL (ref 0.0–0.5)
Eosinophils Relative: 0 %
HCT: 37.3 % (ref 36.0–46.0)
Hemoglobin: 12.2 g/dL (ref 12.0–15.0)
Immature Granulocytes: 18 %
Lymphocytes Relative: 14 %
Lymphs Abs: 3.1 K/uL (ref 0.7–4.0)
MCH: 26.4 pg (ref 26.0–34.0)
MCHC: 32.7 g/dL (ref 30.0–36.0)
MCV: 80.7 fL (ref 80.0–100.0)
Monocytes Absolute: 1.9 K/uL — ABNORMAL HIGH (ref 0.1–1.0)
Monocytes Relative: 9 %
Neutro Abs: 12.9 K/uL — ABNORMAL HIGH (ref 1.7–7.7)
Neutrophils Relative %: 59 %
Platelet Count: 276 K/uL (ref 150–400)
RBC: 4.62 MIL/uL (ref 3.87–5.11)
RDW: 13.4 % (ref 11.5–15.5)
Smear Review: NORMAL
WBC Count: 21.8 K/uL — ABNORMAL HIGH (ref 4.0–10.5)
nRBC: 0.5 % — ABNORMAL HIGH (ref 0.0–0.2)

## 2024-07-28 LAB — BASIC METABOLIC PANEL - CANCER CENTER ONLY
Anion gap: 9 (ref 5–15)
BUN: 17 mg/dL (ref 8–23)
CO2: 25 mmol/L (ref 22–32)
Calcium: 9 mg/dL (ref 8.9–10.3)
Chloride: 103 mmol/L (ref 98–111)
Creatinine: 0.79 mg/dL (ref 0.44–1.00)
GFR, Estimated: >60 mL/min (ref >60)
Glucose, Bld: 131 mg/dL — ABNORMAL HIGH (ref 70–99)
Potassium: 3.4 mmol/L — ABNORMAL LOW (ref 3.5–5.1)
Sodium: 137 mmol/L (ref 135–145)

## 2024-07-28 NOTE — Progress Notes (Signed)
 No IVF today

## 2024-07-28 NOTE — Telephone Encounter (Signed)
 Patient presented to clinic as scheduled. No further follow up needed.

## 2024-07-28 NOTE — Progress Notes (Signed)
 Symptom Management Clinic [facility] at [facility] [phone] [phone]  Patient Care Team: Diedra Lame, MD as PCP - General (Family Medicine) Georgina Shasta POUR, RN as Oncology Nurse Navigator Melanee Annah BROCKS, MD as Consulting Physician (Oncology)   NAME OF PATIENTBritnay [name]  [id number]  08/24/1951   DATE OF VISIT: 07/28/24  REASON FOR CONSULT: Teresa [name] is a 73 y.o. 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   INTERVAL HISTORY: Patient received cycle 1 TCHP chemotherapy on 07/18/2024.  She presents to [facility] today for follow-up labs and supportive care.  Patient says she tolerated treatment well.  Denies any adverse effects.  Denies any symptomatic complaints or concerns today.  Reports that she is eating and drinking well.  Denies any neurologic complaints. Denies recent fevers or illnesses. Denies any easy bleeding or bruising. Reports good appetite and denies weight loss. Denies chest pain. Denies any nausea, vomiting, constipation, or diarrhea. Denies urinary complaints. Patient offers no further specific complaints today.   PAST MEDICAL HISTORY: Past Medical History:  Diagnosis Date   Acute encephalopathy 05/13/2024   Allergic rhinitis    Anxiety    B12 deficiency 05/13/2024   Cystitis, acute 04/02/2014   Depression    Diabetes mellitus without complication (HCC)    GERD (gastroesophageal reflux disease)    Head injury    History of falling 10/06/2012   Hyperlipidemia    Lumbar radiculopathy 02/20/2015   Malignant neoplasm of upper-outer quadrant of left breast in female, estrogen receptor negative (HCC) 06/30/2024   SIRS (systemic inflammatory response syndrome) (HCC) 05/13/2024   Status post lumbar laminectomy 10/06/2012    PAST SURGICAL HISTORY:  Past Surgical History:  Procedure Laterality Date   ABDOMINAL HYSTERECTOMY     BACK SURGERY      2000   BREAST BIOPSY Left 06/20/2024   u/s bx x2 clip ribbon and coil   BREAST BIOPSY Left 06/20/2024   US  LT BREAST BX W LOC DEV EA ADD LESION IMG BX SPEC US  GUIDE 06/20/2024 [facility]   BREAST BIOPSY Left 06/20/2024   US  LT BREAST BX W LOC DEV 1ST LESION IMG BX SPEC US  GUIDE 06/20/2024 [facility]   PORTACATH PLACEMENT Left 07/07/2024   Procedure: INSERTION, TUNNELED CENTRAL VENOUS DEVICE, WITH PORT;  Surgeon: Tye Millet, DO;  Location: [facility] OR[facility];  Service: General;  Laterality: Left;   TUBAL LIGATION      HEMATOLOGY/ONCOLOGY HISTORY:  Oncology History  Malignant neoplasm of upper-outer quadrant of left breast in female, estrogen receptor negative (HCC)  06/30/2024 Initial Diagnosis   Malignant neoplasm of upper-outer quadrant of left breast in female, estrogen receptor negative (HCC)   06/30/2024 Cancer Staging   Staging form: Breast, AJCC 8th Edition - Clinical stage from 06/30/2024: Stage IIB (cT3, cN0, cM0, G3, ER-, PR-, HER2+) - Signed by Melanee Annah BROCKS, MD on 06/30/2024 Stage prefix: Initial diagnosis Histologic grading system: 3 grade system   07/18/2024 -  Chemotherapy   Patient is on Treatment Plan : BREAST  Docetaxel  + Carboplatin  + Trastuzumab + Pertuzumab   (TCHP) q21d        ALLERGIES:  is allergic to aleve [naproxen sodium] and aspirin.  MEDICATIONS:  Current Outpatient Medications  Medication Sig Dispense Refill   acetaminophen  (TYLENOL ) 500 MG tablet Take 1 tablet (500 mg total) by mouth every 4 (four) hours as needed. 120 tablet 1   cetirizine  (ZYRTEC ) 10 MG tablet Take 1 tablet (10 mg  total) by mouth daily. (Patient taking differently: Take 10 mg by mouth daily as needed for allergies.) 30 tablet 3   dexamethasone  (DECADRON ) 4 MG tablet Take 2 tabs by mouth 2 times daily starting day before chemo. Then take 2 tabs daily for 2 days starting day after chemo. Take with food. 30 tablet 1   lidocaine -prilocaine  (EMLA ) cream Apply to affected area once 30 g 3    LORazepam  (ATIVAN ) 0.5 MG tablet Take 1 tablet (0.5 mg total) by mouth every 8 (eight) hours as needed for anxiety. 30 tablet 0   Multiple Vitamin (MULTIVITAMIN WITH MINERALS) TABS tablet Take 1 tablet by mouth daily.     ondansetron  (ZOFRAN ) 8 MG tablet Take 1 tablet (8 mg total) by mouth every 8 (eight) hours as needed for nausea or vomiting. Start on the third day after chemotherapy. 30 tablet 1   prochlorperazine  (COMPAZINE ) 10 MG tablet Take 1 tablet (10 mg total) by mouth every 6 (six) hours as needed for nausea or vomiting. 30 tablet 1   traMADol  (ULTRAM ) 50 MG tablet Take 1 tablet (50 mg total) by mouth every 8 (eight) hours as needed. 6 tablet 0   venlafaxine  XR (EFFEXOR -XR) 75 MG 24 hr capsule Take 75 mg by mouth daily.     No current facility-administered medications for this visit.    VITAL SIGNS: Ht 5' 4 (1.626 m)   Wt 165 lb (74.8 kg)   BMI 28.32 kg/m  Filed Weights   07/28/24 1003  Weight: 165 lb (74.8 kg)    Estimated body mass index is 28.32 kg/m as calculated from the following:   Height as of this encounter: 5' 4 (1.626 m).   Weight as of this encounter: 165 lb (74.8 kg).  LABS: CBC:    Component Value Date/Time   WBC 21.8 (H) 07/28/2024 0945   WBC 10.2 05/15/2024 0511   HGB 12.2 07/28/2024 0945   HCT 37.3 07/28/2024 0945   PLT 276 07/28/2024 0945   MCV 80.7 07/28/2024 0945   NEUTROABS PENDING 07/28/2024 0945   LYMPHSABS PENDING 07/28/2024 0945   MONOABS PENDING 07/28/2024 0945   EOSABS PENDING 07/28/2024 0945   BASOSABS PENDING 07/28/2024 0945   Comprehensive Metabolic Panel:    Component Value Date/Time   NA 137 07/18/2024 0847   K 3.4 (L) 07/18/2024 0847   CL 103 07/18/2024 0847   CO2 22 07/18/2024 0847   BUN 22 07/18/2024 0847   CREATININE 0.73 07/18/2024 0847   CREATININE 0.69 02/20/2015 0926   GLUCOSE 176 (H) 07/18/2024 0847   CALCIUM 9.5 07/18/2024 0847   AST 33 07/18/2024 0847   ALT 20 07/18/2024 0847   ALKPHOS 61 07/18/2024 0847    BILITOT 0.5 07/18/2024 0847   PROT 6.8 07/18/2024 0847   ALBUMIN 3.9 07/18/2024 0847    RADIOGRAPHIC STUDIES: NM Bone Scan Whole Body Result Date: 07/16/2024 CLINICAL DATA:  Recently diagnosed left breast cancer. EXAM: NUCLEAR MEDICINE WHOLE BODY BONE SCAN TECHNIQUE: Whole body anterior and posterior images were obtained approximately 3 hours after intravenous injection of radiopharmaceutical. RADIOPHARMACEUTICALS:  21.83 mCi Technetium-75m MDP IV COMPARISON:  CT of the chest, abdomen and pelvis 05/13/2024. Breast MRI 07/11/2024. FINDINGS: There is focal, somewhat linear prominent uptake superiorly in the sternum, near the sternomanubrial junction. No obvious corresponding abnormality in this area on previous CT. The recent breast MRI demonstrates some marrow edema and enhancement in this region, suboptimally evaluated in the axial plane. Mild degenerative uptake in both knees and the toes of  the left foot. Mild degenerative/postsurgical uptake within the thoracolumbar spine. No other concerning osseous uptake. Urine contamination noted in the perineal region. The soft tissue uptake is otherwise unremarkable. IMPRESSION: 1. Indeterminate uptake in the sternum near the sternomanubrial junction, favored to represent a subacute fracture or arthropathy based on bone scan morphology. An isolated metastasis involving the sternal manubrium is not completely excluded based on the recent MR appearance. Recommend attention on follow-up chest CT. 2. No other evidence of osseous metastatic disease. 3. Degenerative and postsurgical uptake as described. Electronically Signed   By: Elsie Perone M.D.   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 Pretreatment MRI  to evaluate extent of disease. The masses were initially identified on a chest CT 05/13/2024. EXAM: BILATERAL BREAST MRI WITH AND WITHOUT CONTRAST TECHNIQUE: Multiplanar, multisequence MR images of both breasts were obtained prior to and following the intravenous administration of 7 ml of Gadavist . Three-dimensional MR images were rendered by post-processing of the original MR data on an independent workstation. The three-dimensional MR images were interpreted, and findings are reported in the following complete MRI report for this study. Three dimensional images were evaluated at the independent interpreting workstation using the DynaCAD thin client. COMPARISON:  No prior MRI. Prior mammography and breast ultrasounds, most recently at the time of biopsy 06/20/2024. FINDINGS: Breast composition: b. Scattered fibroglandular tissue. Background parenchymal enhancement: Minimal to mild, symmetric. RIGHT breast: Dominant mass in the UPPER OUTER QUADRANT at middle to posterior depth measuring approximately 4.2 x 2.6 x 4.2 cm (AP X transverse X craniocaudal), biopsy-proven IDC and DCIS (ribbon clip). Adjacent mass in the UPPER OUTER QUADRANT at anterior depth measuring approximately 2.3 x 1.2 x 1.6 cm, biopsy-proven IDC and DCIS (coil clip). Non-mass enhancement is present between the 2 masses. There is 1.9 cm non-mass enhancement extending from the posterior aspect of the dominant mass to the pectoralis muscle, though there is no evidence of pectoralis muscle involvement. 1.0 cm non-mass enhancement extends anterior to the mass at anterior depth. Overall, the abnormal enhancement (including the non-mass enhancement and the masses) spans 9.0 cm. LEFT breast: No mass or abnormal enhancement. Lymph nodes: No abnormal appearing lymph nodes. Ancillary findings: Port-A-Cath catheter tip at the cavoatrial junction. IMPRESSION: 1. 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 The  dominant mass at middle to posterior depth and the adjacent mass at anterior depth are measured above. 2. Non-mass enhancement extends approximately 1.9 cm posterior to the dominant mass and approximately 1.0 cm anterior to the smaller mass such that the overall extent of abnormal enhancement is 9 cm. 3. No MRI evidence of malignancy involving the LEFT breast. 4. No pathologic lymphadenopathy. RECOMMENDATION: If a breast sparing procedure is being contemplated after neoadjuvant therapy, then biopsy of the non-mass enhancement posterior to the dominant mass and the non-mass enhancement anterior the smaller mass is recommended to delineate the entire extent of disease. An alternative would be to obtain a posterior margin to the pectoralis muscle and an anterior margin to the nipple which would include the entirety of the abnormal enhancement. BI-RADS CATEGORY  6: Known biopsy-proven malignancy. Electronically Signed   By: Debby Satterfield M.D.   On: 07/13/2024 12:52   DG CHEST PORT 1 VIEW Result Date: 07/07/2024 CLINICAL DATA:  Status post Port-A-Cath placement. EXAM: PORTABLE CHEST 1 VIEW COMPARISON:  05/13/2024. FINDINGS: Status post right sided Port-A-Cath placement with tip overlying the right atrium. No pneumothorax. Low lung volumes with associated accentuation of the cardiopericardial silhouette. Aortic atherosclerosis. Mild left basilar atelectasis. No focal consolidation or sizable pleural effusion. Lower thoracic fixation hardware. IMPRESSION: 1. Status post right sided Port-A-Cath placement with tip at the level of the right atrium. No pneumothorax. 2. Low lung volumes with mild left basilar atelectasis. Electronically Signed   By: Harrietta Sherry M.D.   On: 07/07/2024 13:41   DG C-Arm 1-60 Min-No Report Result Date: 07/07/2024 Fluoroscopy was utilized by the requesting physician.  No radiographic interpretation.   ECHOCARDIOGRAM COMPLETE Result Date: 07/03/2024    ECHOCARDIOGRAM REPORT   Patient  Name:   Teresa [name] Date of Exam: 07/03/2024 Medical Rec #:  [id number]        Height:       64.0 in Accession #:    [id number]       Weight:       166.4 lb Date of Birth:  1950/11/08        BSA:          1.809 m Patient Age:    73 years         BP:           119/73 mmHg Patient Gender: F                HR:           63 bpm. Exam Location:  Inpatient Procedure: 2D Echo, Color Doppler, Cardiac Doppler and Strain Analysis (Both            Spectral and Color Flow Doppler were utilized during procedure). Indications:    Chemo Z09  History:        Patient has no prior history of Echocardiogram examinations.  Sonographer:    Tinnie Gosling Referring Phys: [id number] ARCHANA C RAO IMPRESSIONS  1. Left ventricular ejection fraction, by estimation, is 60 to 65%. The left ventricle has normal function. The left ventricle has no regional wall motion abnormalities. Left ventricular diastolic parameters were normal. The average left ventricular global longitudinal strain is -18.5 %. The global longitudinal strain is normal.  2. Right ventricular systolic function is normal. The right ventricular size is normal.  3. The mitral valve is normal in structure. No evidence of mitral valve regurgitation. No evidence of mitral stenosis.  4. The aortic valve is tricuspid. Aortic valve regurgitation is not visualized. No aortic stenosis is present.  5. The inferior vena cava is normal in size with greater than 50% respiratory variability, suggesting right atrial pressure of 3 mmHg. FINDINGS  Left Ventricle: Left ventricular ejection fraction, by estimation, is 60 to 65%. The left ventricle has normal function. The left ventricle has no regional wall motion abnormalities. The average left ventricular global longitudinal strain is -18.5 %. Strain was performed and the global longitudinal strain is normal. The left ventricular internal cavity size was normal in size. There is no left ventricular hypertrophy. Left ventricular diastolic  parameters were normal. Normal left ventricular filling pressure. Right Ventricle: The right ventricular size is normal. No increase in right ventricular wall thickness. Right ventricular systolic function is normal. Left Atrium: Left atrial size was normal in size. Right Atrium: Right atrial size was normal in size. Prominent Eustachian valve. Pericardium: There is no evidence of pericardial effusion. Mitral Valve: The mitral valve is normal in structure.  Mild mitral annular calcification. No evidence of mitral valve regurgitation. No evidence of mitral valve stenosis. Tricuspid Valve: The tricuspid valve is normal in structure. Tricuspid valve regurgitation is not demonstrated. No evidence of tricuspid stenosis. Aortic Valve: The aortic valve is tricuspid. Aortic valve regurgitation is not visualized. No aortic stenosis is present. Pulmonic Valve: The pulmonic valve was normal in structure. Pulmonic valve regurgitation is not visualized. No evidence of pulmonic stenosis. Aorta: The aortic root and ascending aorta are structurally normal, with no evidence of dilitation. Venous: The inferior vena cava is normal in size with greater than 50% respiratory variability, suggesting right atrial pressure of 3 mmHg. IAS/Shunts: No atrial level shunt detected by color flow Doppler.  LEFT VENTRICLE PLAX 2D LVIDd:         3.60 cm     Diastology LVIDs:         2.30 cm     LV e' medial:    5.98 cm/s LV PW:         1.10 cm     LV E/e' medial:  11.6 LV IVS:        0.90 cm     LV e' lateral:   10.30 cm/s LVOT diam:     1.90 cm     LV E/e' lateral: 6.7 LV SV:         78 LV SV Index:   43          2D Longitudinal Strain LVOT Area:     2.84 cm    2D Strain GLS Avg:     -18.5 %  LV Volumes (MOD) LV vol d, MOD A2C: 63.8 ml LV vol d, MOD A4C: 87.6 ml LV vol s, MOD A2C: 18.3 ml LV vol s, MOD A4C: 31.5 ml LV SV MOD A2C:     45.5 ml LV SV MOD A4C:     87.6 ml LV SV MOD BP:      50.1 ml RIGHT VENTRICLE             IVC RV S prime:     14.90  cm/s  IVC diam: 1.10 cm TAPSE (M-mode): 1.5 cm LEFT ATRIUM             Index        RIGHT ATRIUM           Index LA diam:        3.30 cm 1.82 cm/m   RA Area:     13.10 cm LA Vol (A2C):   36.1 ml 19.95 ml/m  RA Volume:   26.50 ml  14.65 ml/m LA Vol (A4C):   37.7 ml 20.84 ml/m LA Biplane Vol: 39.4 ml 21.78 ml/m  AORTIC VALVE LVOT Vmax:   101.00 cm/s LVOT Vmean:  76.900 cm/s LVOT VTI:    0.274 m  AORTA Ao Root diam: 2.70 cm Ao Asc diam:  3.10 cm MITRAL VALVE MV Area (PHT): 2.22 cm    SHUNTS MV Decel Time: 341 msec    Systemic VTI:  0.27 m MV E velocity: 69.20 cm/s  Systemic Diam: 1.90 cm MV A velocity: 94.30 cm/s MV E/A ratio:  0.73 Mihai Croitoru MD Electronically signed by Jerel Balding MD Signature Date/Time: 07/03/2024/1:23:44 PM    Final     PERFORMANCE STATUS (ECOG) : 2 - Symptomatic, <50% confined to bed  Review of Systems Unless otherwise noted, a complete review of systems is negative.  Physical Exam General: NAD Cardiovascular: regular rate and rhythm Pulmonary: clear  ant fields Abdomen: soft, nontender, + bowel sounds GU: no suprapubic tenderness Extremities: no edema, no joint deformities Skin: no rashes Neurological: Weakness but otherwise nonfocal  IMPRESSION/PLAN: Breast cancer -on TCHP chemotherapy.  Patient appears to be doing reasonably well without significant symptomatic complaints or concerns today.  Labs do not show significant metabolic derangements today.  Patient does have mild hypokalemia but she agreed to maximize intake of potassium rich foods.  Will trend labs and can consider starting oral supplement if needed.  MD follow-up in 2 weeks.  Patient to be seen sooner if needed.  Patient expressed understanding and was in agreement with this plan. She also understands that She can call clinic at any time with any questions, concerns, or complaints.   Thank you for allowing me to participate in the care of this very pleasant patient.   Time Total: 15  minutes  Visit consisted of counseling and education dealing with the complex and emotionally intense issues of symptom management in the setting of serious illness.Greater than 50%  of this time was spent counseling and coordinating care related to the above assessment and plan.  Signed by: Fonda Mower, PhD, NP-C

## 2024-08-01 ENCOUNTER — Other Ambulatory Visit: Payer: Self-pay | Admitting: Licensed Clinical Social Worker

## 2024-08-01 DIAGNOSIS — Z1379 Encounter for other screening for genetic and chromosomal anomalies: Secondary | ICD-10-CM

## 2024-08-03 ENCOUNTER — Telehealth: Payer: Self-pay | Admitting: *Deleted

## 2024-08-03 ENCOUNTER — Telehealth: Payer: Self-pay

## 2024-08-03 NOTE — Telephone Encounter (Signed)
 Clinical Social Worker left a voicemail for patient to follow up on psychosocial needs.  CSW phoned patient's husband, Leatrice, after he left a Engineer, technical sales for [employer], CSW Intern.  Leatrice stated he was at work and asked to call CSW at a later time.

## 2024-08-03 NOTE — Telephone Encounter (Signed)
 Has called several times today but he was asking for [facility] and I do not know a linda. He leaves his name and I can 't anything and I call him and he does not answer because  he is at work and I try to call back  several times and the voicemail is full. I finally got his today and he wants help for his wife and he is off work on [employer] and Friday. If you can call him

## 2024-08-08 ENCOUNTER — Inpatient Hospital Stay

## 2024-08-08 ENCOUNTER — Inpatient Hospital Stay: Admitting: Licensed Clinical Social Worker

## 2024-08-08 ENCOUNTER — Inpatient Hospital Stay: Admitting: Oncology

## 2024-08-08 ENCOUNTER — Other Ambulatory Visit: Payer: Self-pay | Admitting: *Deleted

## 2024-08-08 ENCOUNTER — Encounter: Payer: Self-pay | Admitting: Oncology

## 2024-08-08 ENCOUNTER — Ambulatory Visit

## 2024-08-08 VITALS — BP 165/85 | HR 90 | Temp 97.0°F | Resp 19

## 2024-08-08 VITALS — BP 122/79 | HR 93 | Temp 96.7°F | Resp 18 | Ht 64.0 in | Wt 167.3 lb

## 2024-08-08 DIAGNOSIS — Z1379 Encounter for other screening for genetic and chromosomal anomalies: Secondary | ICD-10-CM

## 2024-08-08 DIAGNOSIS — Z5111 Encounter for antineoplastic chemotherapy: Secondary | ICD-10-CM | POA: Diagnosis not present

## 2024-08-08 DIAGNOSIS — Z171 Estrogen receptor negative status [ER-]: Secondary | ICD-10-CM

## 2024-08-08 DIAGNOSIS — C50412 Malignant neoplasm of upper-outer quadrant of left female breast: Secondary | ICD-10-CM

## 2024-08-08 DIAGNOSIS — Z1731 Human epidermal growth factor receptor 2 positive status: Secondary | ICD-10-CM | POA: Diagnosis not present

## 2024-08-08 DIAGNOSIS — Z5112 Encounter for antineoplastic immunotherapy: Secondary | ICD-10-CM

## 2024-08-08 DIAGNOSIS — Z1722 Progesterone receptor negative status: Secondary | ICD-10-CM

## 2024-08-08 LAB — CBC WITH DIFFERENTIAL (CANCER CENTER ONLY)
Abs Immature Granulocytes: 0.04 K/uL (ref 0.00–0.07)
Basophils Absolute: 0 K/uL (ref 0.0–0.1)
Basophils Relative: 1 %
Eosinophils Absolute: 0 K/uL (ref 0.0–0.5)
Eosinophils Relative: 0 %
HCT: 35.4 % — ABNORMAL LOW (ref 36.0–46.0)
Hemoglobin: 11.7 g/dL — ABNORMAL LOW (ref 12.0–15.0)
Immature Granulocytes: 1 %
Lymphocytes Relative: 15 %
Lymphs Abs: 1.2 K/uL (ref 0.7–4.0)
MCH: 26.8 pg (ref 26.0–34.0)
MCHC: 33.1 g/dL (ref 30.0–36.0)
MCV: 81 fL (ref 80.0–100.0)
Monocytes Absolute: 0.4 K/uL (ref 0.1–1.0)
Monocytes Relative: 5 %
Neutro Abs: 6.5 K/uL (ref 1.7–7.7)
Neutrophils Relative %: 78 %
Platelet Count: 265 K/uL (ref 150–400)
RBC: 4.37 MIL/uL (ref 3.87–5.11)
RDW: 14.6 % (ref 11.5–15.5)
WBC Count: 8.3 K/uL (ref 4.0–10.5)
nRBC: 0 % (ref 0.0–0.2)

## 2024-08-08 LAB — CMP (CANCER CENTER ONLY)
ALT: 18 U/L (ref 0–44)
AST: 29 U/L (ref 15–41)
Albumin: 3.8 g/dL (ref 3.5–5.0)
Alkaline Phosphatase: 57 U/L (ref 38–126)
Anion gap: 9 (ref 5–15)
BUN: 13 mg/dL (ref 8–23)
CO2: 23 mmol/L (ref 22–32)
Calcium: 8.8 mg/dL — ABNORMAL LOW (ref 8.9–10.3)
Chloride: 103 mmol/L (ref 98–111)
Creatinine: 0.71 mg/dL (ref 0.44–1.00)
GFR, Estimated: >60 mL/min (ref >60)
Glucose, Bld: 146 mg/dL — ABNORMAL HIGH (ref 70–99)
Potassium: 3.5 mmol/L (ref 3.5–5.1)
Sodium: 135 mmol/L (ref 135–145)
Total Bilirubin: 0.6 mg/dL (ref 0.0–1.2)
Total Protein: 6.6 g/dL (ref 6.5–8.1)

## 2024-08-08 LAB — GENETIC SCREENING ORDER

## 2024-08-08 MED ORDER — ACETAMINOPHEN 325 MG PO TABS
650.0000 mg | ORAL_TABLET | Freq: Once | ORAL | Status: AC
  Administered 2024-08-08: 650 mg via ORAL
  Filled 2024-08-08: qty 2

## 2024-08-08 MED ORDER — PERTUZ-TRASTUZ-HYALURON-ZZXF 60-60-2000 MG-MG-U/ML CHEMO ~~LOC~~ SOLN
600.0000 mg | Freq: Once | SUBCUTANEOUS | Status: AC
  Administered 2024-08-08: 600 mg via SUBCUTANEOUS
  Filled 2024-08-08: qty 10

## 2024-08-08 MED ORDER — SODIUM CHLORIDE 0.9 % IV SOLN
INTRAVENOUS | Status: DC
  Filled 2024-08-08: qty 250

## 2024-08-08 MED ORDER — SODIUM CHLORIDE 0.9 % IV SOLN
75.0000 mg/m2 | Freq: Once | INTRAVENOUS | Status: AC
  Administered 2024-08-08: 139 mg via INTRAVENOUS
  Filled 2024-08-08: qty 13.9

## 2024-08-08 MED ORDER — APREPITANT 130 MG/18ML IV EMUL
130.0000 mg | Freq: Once | INTRAVENOUS | Status: AC
  Administered 2024-08-08: 130 mg via INTRAVENOUS
  Filled 2024-08-08: qty 18

## 2024-08-08 MED ORDER — SODIUM CHLORIDE 0.9 % IV SOLN
432.5000 mg | Freq: Once | INTRAVENOUS | Status: AC
  Administered 2024-08-08: 430 mg via INTRAVENOUS
  Filled 2024-08-08: qty 43

## 2024-08-08 MED ORDER — PALONOSETRON HCL INJECTION 0.25 MG/5ML
0.2500 mg | Freq: Once | INTRAVENOUS | Status: AC
  Administered 2024-08-08: 0.25 mg via INTRAVENOUS
  Filled 2024-08-08: qty 5

## 2024-08-08 MED ORDER — DIPHENHYDRAMINE HCL 25 MG PO CAPS
50.0000 mg | ORAL_CAPSULE | Freq: Once | ORAL | Status: AC
  Administered 2024-08-08: 50 mg via ORAL
  Filled 2024-08-08: qty 2

## 2024-08-08 MED ORDER — DEXAMETHASONE SOD PHOSPHATE PF 10 MG/ML IJ SOLN
10.0000 mg | Freq: Once | INTRAMUSCULAR | Status: AC
  Administered 2024-08-08: 10 mg via INTRAVENOUS

## 2024-08-08 NOTE — Progress Notes (Signed)
 Hematology/Oncology Consult note [facility]  [phone] [phone]  Patient Care Team: Diedra Lame, MD as PCP - General (Family Medicine) Georgina Shasta POUR, RN as Oncology Nurse Navigator Melanee Annah BROCKS, MD as Consulting Physician (Oncology)   Name of the patient: Teresa [name]  [id number]  12/21/1950   Date of visit: 08/08/24  Diagnosis-  Cancer Staging  Malignant neoplasm of upper-outer quadrant of left breast in female, estrogen receptor negative (HCC) Staging form: Breast, AJCC 8th Edition - Clinical stage from 06/30/2024: Stage IIB (cT3, cN0, cM0, G3, ER-, PR-, HER2+) - Signed by Melanee Annah BROCKS, MD on 06/30/2024 Stage prefix: Initial diagnosis Histologic grading system: 3 grade system    Chief complaint/ Reason for visit-on treatment assessment prior to cycle 2 of neoadjuvant TCHP chemotherapy  Heme/Onc history: patient is a 73 year old female with a past medical history significant for chronic back pain status post lumbar laminectomy, type 2 diabetes who presented to the ER with symptoms of bodyaches and underwent CT chest abdomen and pelvis with contrast which incidentally showed left breast mass but no evidence of metastatic disease.     Patient subsequently underwent mammogram and ultrasound which showed 1. 8 cm group of highly suspicious UPPER-OUTER LEFT breast calcifications with 4.5 cm mass identified sonographically along the posterior aspect of the calcifications and a 2.3 cm mass identified sonographically along the anterior aspect of these calcifications. Tissue sampling of the posterior aspect of the 1-2 o'clock position mass 5 cm from the nipple in the anterior aspect of the 2.3 cm 2 o'clock position mass 3 cm from the nipple is recommended.2. No abnormal appearing LEFT axillary lymph nodes.3. No suspicious mammographic findings within the RIGHT breast.   Patient underwent core biopsy of both the breast masses.  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  The 2.3 cm mass was positive for grade 2 invasive mammary carcinoma and receptor testing was not done on that specimen.   CT chest abdomen pelvis With contrast from July 2025 did not show any evidence of distant metastatic disease.  MRI bilateral breast also confirmed known findings from mammogram with total mass and non-mass enhancement extending up to 9 cm in the upper outer quadrant of the left breast.  No malignancy noted on the right side.  Bone scan showed nonspecific uptake in the sternomanubrial junction which could be subacute fracture versus arthropathy.  Baseline echocardiogram shows normal EF of 60 to 65%    Interval history- Teresa [name] is a 73 year old female with HER2 positive breast cancer who presents for her second chemotherapy treatment.  She is undergoing chemotherapy every three weeks for HER2 positive breast cancer. Today marks her second treatment out of a planned six treatments.  Following her first chemotherapy session three weeks ago, she experienced significant fatigue and a single episode of diarrhea, which she managed with [place], allowing her to sleep well. No nausea, vomiting, or fever.  She has chronic back pain  ECOG PS- 2 Pain scale- 3   Review of systems- Review of Systems  Constitutional:  Positive for malaise/fatigue. Negative for chills, fever and weight loss.  HENT:  Negative for congestion, ear discharge and nosebleeds.   Eyes:  Negative for blurred vision.  Respiratory:  Negative for cough, hemoptysis, sputum production, shortness of breath and wheezing.   Cardiovascular:  Negative for chest pain, palpitations, orthopnea and claudication.  Gastrointestinal:  Negative for abdominal pain, blood in stool, constipation, diarrhea, heartburn, melena, nausea and vomiting.  Genitourinary:  Negative for dysuria, flank pain, frequency, hematuria and  urgency.  Musculoskeletal:  Positive for back pain. Negative for joint pain and myalgias.  Skin:  Negative for rash.  Neurological:  Negative for dizziness, tingling, focal weakness, seizures, weakness and headaches.  Endo/Heme/Allergies:  Does not bruise/bleed easily.  Psychiatric/Behavioral:  Negative for depression and suicidal ideas. The patient does not have insomnia.       Allergies  Allergen Reactions   Aleve [Naproxen Sodium]     Facial swelling   Aspirin     REACTION: nausea     Past Medical History:  Diagnosis Date   Acute encephalopathy 05/13/2024   Allergic rhinitis    Anxiety    B12 deficiency 05/13/2024   Cystitis, acute 04/02/2014   Depression    Diabetes mellitus without complication (HCC)    GERD (gastroesophageal reflux disease)    Head injury    History of falling 10/06/2012   Hyperlipidemia    Lumbar radiculopathy 02/20/2015   Malignant neoplasm of upper-outer quadrant of left breast in female, estrogen receptor negative (HCC) 06/30/2024   SIRS (systemic inflammatory response syndrome) (HCC) 05/13/2024   Status post lumbar laminectomy 10/06/2012     Past Surgical History:  Procedure Laterality Date   ABDOMINAL HYSTERECTOMY     BACK SURGERY     2000   BREAST BIOPSY Left 06/20/2024   u/s bx x2 clip ribbon and coil   BREAST BIOPSY Left 06/20/2024   US  LT BREAST BX W LOC DEV EA ADD LESION IMG BX SPEC US  GUIDE 06/20/2024 [facility]   BREAST BIOPSY Left 06/20/2024   US  LT BREAST BX W LOC DEV 1ST LESION IMG BX SPEC US  GUIDE 06/20/2024 [facility]   PORTACATH PLACEMENT Left 07/07/2024   Procedure: INSERTION, TUNNELED CENTRAL VENOUS DEVICE, WITH PORT;  Surgeon: Tye Millet, DO;  Location: [facility];  Service: General;  Laterality: Left;   TUBAL LIGATION      Social History   Socioeconomic History   Marital status: Married    Spouse name: Leatrice   Number of children: 3   Years of education: Not on file   Highest education level: Not on  file  Occupational History    Comment: Works as Social worker  Tobacco Use   Smoking status: Never   Smokeless tobacco: Not on file  Vaping Use   Vaping status: Never Used  Substance and Sexual Activity   Alcohol use: Never   Drug use: Never   Sexual activity: Not Currently  Other Topics Concern   Not on file  Social History Narrative   ** Merged History Encounter **       Social Drivers of Health   Financial Resource Strain: Patient Declined (12/07/2023)   Received from [facility]   Overall Financial Resource Strain (CARDIA)    Difficulty of Paying Living Expenses: Patient declined  Food Insecurity: No Food Insecurity (06/30/2024)   Hunger Vital Sign    Worried About Running Out of Food in the Last Year: Never true    Ran Out of Food in the Last Year: Never true  Transportation Needs: No Transportation Needs (06/30/2024)   PRAPARE - Administrator, Civil Service (Medical): No    Lack of Transportation (Non-Medical): No  Physical Activity: Not on file  Stress: Not on file  Social Connections: Patient Unable To Answer (05/14/2024)   Social Connection and Isolation Panel  Frequency of Communication with Friends and Family: Patient unable to answer    Frequency of Social Gatherings with Friends and Family: Patient unable to answer    Attends Religious Services: Patient unable to answer    Active Member of Clubs or Organizations: Patient unable to answer    Attends Banker Meetings: Patient unable to answer    Marital Status: Patient unable to answer  Intimate Partner Violence: Not At Risk (06/30/2024)   Humiliation, Afraid, Rape, and Kick questionnaire    Fear of Current or Ex-Partner: No    Emotionally Abused: No    Physically Abused: No    Sexually Abused: No    Family History  Problem Relation Age of Onset   Healthy Mother    Arthritis Sister    Stroke Sister    Arthritis Sister    Breast cancer Neg Hx      Current Outpatient  Medications:    acetaminophen  (TYLENOL ) 500 MG tablet, Take 1 tablet (500 mg total) by mouth every 4 (four) hours as needed., Disp: 120 tablet, Rfl: 1   atenolol  (TENORMIN ) 25 MG tablet, Take 25 mg by mouth daily., Disp: , Rfl:    cetirizine  (ZYRTEC ) 10 MG tablet, Take 1 tablet (10 mg total) by mouth daily. (Patient taking differently: Take 10 mg by mouth daily as needed for allergies.), Disp: 30 tablet, Rfl: 3   dexamethasone  (DECADRON ) 4 MG tablet, Take 2 tabs by mouth 2 times daily starting day before chemo. Then take 2 tabs daily for 2 days starting day after chemo. Take with food., Disp: 30 tablet, Rfl: 1   lidocaine -prilocaine  (EMLA ) cream, Apply to affected area once, Disp: 30 g, Rfl: 3   LORazepam  (ATIVAN ) 0.5 MG tablet, Take 1 tablet (0.5 mg total) by mouth every 8 (eight) hours as needed for anxiety., Disp: 30 tablet, Rfl: 0   Multiple Vitamin (MULTIVITAMIN WITH MINERALS) TABS tablet, Take 1 tablet by mouth daily., Disp: , Rfl:    ondansetron  (ZOFRAN ) 8 MG tablet, Take 1 tablet (8 mg total) by mouth every 8 (eight) hours as needed for nausea or vomiting. Start on the third day after chemotherapy., Disp: 30 tablet, Rfl: 1   prochlorperazine  (COMPAZINE ) 10 MG tablet, Take 1 tablet (10 mg total) by mouth every 6 (six) hours as needed for nausea or vomiting., Disp: 30 tablet, Rfl: 1   traMADol  (ULTRAM ) 50 MG tablet, Take 1 tablet (50 mg total) by mouth every 8 (eight) hours as needed., Disp: 6 tablet, Rfl: 0   venlafaxine  XR (EFFEXOR -XR) 75 MG 24 hr capsule, Take 75 mg by mouth daily., Disp: , Rfl:  No current facility-administered medications for this visit.  Facility-Administered Medications Ordered in Other Visits:    0.9 %  sodium chloride  infusion, , Intravenous, Continuous, Melanee Annah BROCKS, MD, Last Rate: 10 mL/hr at 08/08/24 1019, New Bag at 08/08/24 1019   CARBOplatin  (PARAPLATIN ) 430 mg in sodium chloride  0.9 % 250 mL chemo infusion, 430 mg, Intravenous, Once, Melanee Annah BROCKS, MD    DOCEtaxel  (TAXOTERE ) 139 mg in sodium chloride  0.9 % 250 mL chemo infusion, 75 mg/m2 (Treatment Plan Recorded), Intravenous, Once, Melanee Annah BROCKS, MD, Last Rate: 264 mL/hr at 08/08/24 1144, 139 mg at 08/08/24 1144  Physical exam:  Vitals:   08/08/24 0930  BP: 122/79  Pulse: 93  Resp: 18  Temp: (!) 96.7 F (35.9 C)  TempSrc: Tympanic  SpO2: 99%  Weight: 167 lb 4.8 oz (75.9 kg)  Height: 5' 4 (1.626 m)  Physical Exam Cardiovascular:     Rate and Rhythm: Normal rate and regular rhythm.     Heart sounds: Normal heart sounds.  Pulmonary:     Effort: Pulmonary effort is normal.     Breath sounds: Normal breath sounds.  Skin:    General: Skin is warm and dry.  Neurological:     Mental Status: She is alert and oriented to person, place, and time.      I have personally reviewed labs listed below:    Latest Ref Rng & Units 08/08/2024    8:48 AM  CMP  Glucose 70 - 99 mg/dL 853   BUN 8 - 23 mg/dL 13   Creatinine 9.55 - 1.00 mg/dL 9.28   Sodium 864 - 854 mmol/L 135   Potassium 3.5 - 5.1 mmol/L 3.5   Chloride 98 - 111 mmol/L 103   CO2 22 - 32 mmol/L 23   Calcium 8.9 - 10.3 mg/dL 8.8   Total Protein 6.5 - 8.1 g/dL 6.6   Total Bilirubin 0.0 - 1.2 mg/dL 0.6   Alkaline Phos 38 - 126 U/L 57   AST 15 - 41 U/L 29   ALT 0 - 44 U/L 18       Latest Ref Rng & Units 08/08/2024    8:49 AM  CBC  WBC 4.0 - 10.5 K/uL 8.3   Hemoglobin 12.0 - 15.0 g/dL 88.2   Hematocrit 63.9 - 46.0 % 35.4   Platelets 150 - 400 K/uL 265     MR BREAST BILATERAL W WO CONTRAST INC CAD Addendum Date: 08/01/2024 ADDENDUM REPORT: 08/01/2024 07:59 ADDENDUM: In the original report, I inadvertently reversed the laterality of the findings. The malignancy is in the LEFT breast. There is no evidence of malignancy in the RIGHT breast. Electronically Signed   By: Debby Satterfield M.D.   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 Pretreatment MRI to evaluate extent of disease. The masses were initially identified on a chest CT 05/13/2024. EXAM: BILATERAL BREAST MRI WITH AND WITHOUT CONTRAST TECHNIQUE: Multiplanar, multisequence MR images of both breasts were obtained prior to and following the intravenous administration of 7 ml of Gadavist . Three-dimensional MR images were rendered by post-processing of the original MR data on an independent workstation. The three-dimensional MR images were interpreted, and findings are reported in the following complete MRI report for this study. Three dimensional images were evaluated at the independent interpreting workstation using the DynaCAD thin client. COMPARISON:  No prior MRI. Prior mammography and breast ultrasounds, most recently at the time of biopsy 06/20/2024. FINDINGS: Breast composition: b. Scattered fibroglandular tissue. Background parenchymal enhancement: Minimal to mild, symmetric. RIGHT breast: Dominant mass in the UPPER OUTER QUADRANT at middle to posterior depth measuring approximately 4.2 x 2.6 x 4.2 cm (AP X transverse X craniocaudal), biopsy-proven IDC and DCIS (ribbon clip). Adjacent mass in the UPPER OUTER QUADRANT at anterior depth measuring approximately 2.3 x 1.2 x 1.6 cm, biopsy-proven IDC and DCIS (coil clip). Non-mass enhancement is present between the 2 masses. There is 1.9 cm non-mass enhancement extending from the posterior aspect of the dominant mass to the pectoralis muscle, though there is no evidence of pectoralis muscle involvement. 1.0 cm non-mass enhancement extends anterior to the mass at anterior depth. Overall, the abnormal enhancement (including the non-mass enhancement and the masses)  spans 9.0 cm. LEFT breast: No mass or abnormal enhancement. Lymph nodes: No abnormal appearing lymph nodes. Ancillary findings: Port-A-Cath  catheter tip at the cavoatrial junction. IMPRESSION: 1. 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 The dominant mass at middle to posterior depth and the adjacent mass at anterior depth are measured above. 2. Non-mass enhancement extends approximately 1.9 cm posterior to the dominant mass and approximately 1.0 cm anterior to the smaller mass such that the overall extent of abnormal enhancement is 9 cm. 3. No MRI evidence of malignancy involving the LEFT breast. 4. No pathologic lymphadenopathy. RECOMMENDATION: If a breast sparing procedure is being contemplated after neoadjuvant therapy, then biopsy of the non-mass enhancement posterior to the dominant mass and the non-mass enhancement anterior the smaller mass is recommended to delineate the entire extent of disease. An alternative would be to obtain a posterior margin to the pectoralis muscle and an anterior margin to the nipple which would include the entirety of the abnormal enhancement. BI-RADS CATEGORY  6: Known biopsy-proven malignancy. Electronically Signed: By: Debby Satterfield M.D. On: 07/13/2024 12:52   NM Bone Scan Whole Body Result Date: 07/16/2024 CLINICAL DATA:  Recently diagnosed left breast cancer. EXAM: NUCLEAR MEDICINE WHOLE BODY BONE SCAN TECHNIQUE: Whole body anterior and posterior images were obtained approximately 3 hours after intravenous injection of radiopharmaceutical. RADIOPHARMACEUTICALS:  21.83 mCi Technetium-30m MDP IV COMPARISON:  CT of the chest, abdomen and pelvis 05/13/2024. Breast MRI 07/11/2024. FINDINGS: There is focal, somewhat linear prominent uptake superiorly in the sternum, near the sternomanubrial junction. No obvious corresponding abnormality in this area on previous CT. The recent breast MRI demonstrates some marrow edema and enhancement in this region, suboptimally evaluated in the axial plane. Mild degenerative uptake in both knees and the toes of the left foot. Mild  degenerative/postsurgical uptake within the thoracolumbar spine. No other concerning osseous uptake. Urine contamination noted in the perineal region. The soft tissue uptake is otherwise unremarkable. IMPRESSION: 1. Indeterminate uptake in the sternum near the sternomanubrial junction, favored to represent a subacute fracture or arthropathy based on bone scan morphology. An isolated metastasis involving the sternal manubrium is not completely excluded based on the recent MR appearance. Recommend attention on follow-up chest CT. 2. No other evidence of osseous metastatic disease. 3. Degenerative and postsurgical uptake as described. Electronically Signed   By: Elsie Perone M.D.   On: 07/16/2024 11:03     Assessment and plan- Patient is a 73 y.o. 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  She is here for on treatment assessment prior to cycle 2 of neoadjuvant TCHP chemotherapy  Counts okay to proceed with cycle 2 of neoadjuvant TCHP chemotherapy today with Neulasta  support.  I will see her back in 3 weeks for cycle 3.  Plan is to complete 6 cycles followed by consideration for surgery.  She has tolerated cycle 1 of chemotherapy well so far with minimal side effects.   Visit Diagnosis 1. Malignant neoplasm of upper-outer quadrant of left breast in female, estrogen receptor negative (HCC)   2. Encounter for antineoplastic chemotherapy   3. Encounter for monoclonal antibody treatment for malignancy      Dr. Annah Skene, MD, MPH [facility] [phone] 08/08/2024 12:24 PM

## 2024-08-08 NOTE — Progress Notes (Signed)
 Nutrition Follow-up:  Patient with left breast cancer (ER/PR negative, HER2+).  Patient receiving TCHP chemotherapy.   Met with patient and husband during infusion.  Patient reports that appetite is doing well.  Husband cooks and prepares meals for patient.  Drinks boost shakes.  Eating meats, vegetables, fruits, etc.  Denies nausea.  Few episodes of diarrhea    Medications: reviewed  Labs: reviewed  Anthropometrics:   Weight 167 lb 4.8 oz on 10/14 169 lb 8 oz on 9/23 171 lb on 7/19   NUTRITION DIAGNOSIS: Food and nutrition related knowledge deficit improved   INTERVENTION:  Continue well balanced diet including foods rich in protein Message sent to LCSW team as husband requesting support    MONITORING, EVALUATION, GOAL: weight trends, intake   NEXT VISIT: as needed  Kanijah Groseclose B. Dasie SOLON, CSO, LDN Registered Dietitian [phone]

## 2024-08-08 NOTE — Progress Notes (Signed)
 Patient's husband has some questions & concerns he would like to address.

## 2024-08-08 NOTE — Patient Instructions (Signed)

## 2024-08-09 ENCOUNTER — Encounter: Payer: Self-pay | Admitting: Oncology

## 2024-08-09 ENCOUNTER — Telehealth: Payer: Self-pay

## 2024-08-09 ENCOUNTER — Other Ambulatory Visit: Payer: Self-pay

## 2024-08-09 ENCOUNTER — Telehealth: Payer: Self-pay | Admitting: *Deleted

## 2024-08-09 NOTE — Telephone Encounter (Signed)
 Clinical Social Work received request from [employer] to contact patient's husband.  Attempted to call Leatrice, but his voicemail was full and CSW could not leave a message.  Will attempt at a later time.

## 2024-08-09 NOTE — Telephone Encounter (Signed)
 The daughter said that it is best to talk to her because the patient will talk to you and answer questions but then when the daughter says hey what was they talking about she says she forgets.  Husband for Ms. Teresa [name] he works a part-time job and then he does grass and so he cannot hear anybody when you may call to his phone.  So she said that she will let Teresa [name] about the phone note that Teresa [name] is trying to get in touch with him.  It is better for both the mother and the father to let their daughter to take the messages for them.  Her phone number is in here by the snapshot

## 2024-08-10 ENCOUNTER — Encounter: Payer: Self-pay | Admitting: Oncology

## 2024-08-10 ENCOUNTER — Inpatient Hospital Stay

## 2024-08-10 ENCOUNTER — Inpatient Hospital Stay: Admitting: Licensed Clinical Social Worker

## 2024-08-10 ENCOUNTER — Telehealth: Payer: Self-pay | Admitting: *Deleted

## 2024-08-10 DIAGNOSIS — Z171 Estrogen receptor negative status [ER-]: Secondary | ICD-10-CM

## 2024-08-10 DIAGNOSIS — Z5111 Encounter for antineoplastic chemotherapy: Secondary | ICD-10-CM | POA: Diagnosis not present

## 2024-08-10 MED ORDER — PEGFILGRASTIM-JMDB 6 MG/0.6ML ~~LOC~~ SOSY
6.0000 mg | PREFILLED_SYRINGE | Freq: Once | SUBCUTANEOUS | Status: AC
  Administered 2024-08-10: 6 mg via SUBCUTANEOUS
  Filled 2024-08-10: qty 0.6

## 2024-08-10 NOTE — Telephone Encounter (Signed)
 08/10/24-Fmla form competed- waiting on provider's signature.

## 2024-08-10 NOTE — Progress Notes (Signed)
 REFERRING PROVIDER: Melanee Annah BROCKS, MD [address] [place],  [place] [address]  PRIMARY PROVIDER:  Diedra Lame, MD  PRIMARY REASON FOR VISIT:  1. Malignant neoplasm of upper-outer quadrant of left breast in female, estrogen receptor negative (HCC)      HISTORY OF PRESENT ILLNESS:   Teresa [name], a 73 y.o. female, was seen for a [facility] cancer genetics consultation at the request of Dr. Melanee due to a personal history of cancer.  Teresa [name] presents to clinic today to discuss the possibility of a hereditary predisposition to cancer, genetic testing, and to further clarify her future cancer risks, as well as potential cancer risks for family members.   CANCER HISTORY:  Oncology History  Malignant neoplasm of upper-outer quadrant of left breast in female, estrogen receptor negative (HCC)  06/30/2024 Initial Diagnosis   Malignant neoplasm of upper-outer quadrant of left breast in female, estrogen receptor negative (HCC)   06/30/2024 Cancer Staging   Staging form: Breast, AJCC 8th Edition - Clinical stage from 06/30/2024: Stage IIB (cT3, cN0, cM0, G3, ER-, PR-, HER2+) - Signed by Melanee Annah BROCKS, MD on 06/30/2024 Stage prefix: Initial diagnosis Histologic grading system: 3 grade system   07/18/2024 -  Chemotherapy   Patient is on Treatment Plan : BREAST  Docetaxel  + Carboplatin  + Trastuzumab + Pertuzumab   (TCHP) q21d       In 2025, at the age of 48, Teresa [name] was diagnosed with left breast cancer, 2 distinct masses.  The treatment plan currently includes neoadjuvant TCHP chemotherapy.    Past Medical History:  Diagnosis Date   Acute encephalopathy 05/13/2024   Allergic rhinitis    Anxiety    B12 deficiency 05/13/2024   Cystitis, acute 04/02/2014   Depression    Diabetes mellitus without complication (HCC)    GERD (gastroesophageal reflux disease)    Head injury    History of falling 10/06/2012   Hyperlipidemia    Lumbar radiculopathy 02/20/2015   Malignant neoplasm of  upper-outer quadrant of left breast in female, estrogen receptor negative (HCC) 06/30/2024   SIRS (systemic inflammatory response syndrome) (HCC) 05/13/2024   Status post lumbar laminectomy 10/06/2012    Past Surgical History:  Procedure Laterality Date   ABDOMINAL HYSTERECTOMY     BACK SURGERY     2000   BREAST BIOPSY Left 06/20/2024   u/s bx x2 clip ribbon and coil   BREAST BIOPSY Left 06/20/2024   US  LT BREAST BX W LOC DEV EA ADD LESION IMG BX SPEC US  GUIDE 06/20/2024 [facility]   BREAST BIOPSY Left 06/20/2024   US  LT BREAST BX W LOC DEV 1ST LESION IMG BX SPEC US  GUIDE 06/20/2024 [facility]   PORTACATH PLACEMENT Left 07/07/2024   Procedure: INSERTION, TUNNELED CENTRAL VENOUS DEVICE, WITH PORT;  Surgeon: Tye Millet, DO;  Location: [facility];  Service: General;  Laterality: Left;   TUBAL LIGATION      FAMILY HISTORY:  We obtained a detailed, 4-generation family history.  Significant diagnoses are listed below: Family History  Problem Relation Age of Onset   Healthy Mother    Arthritis Sister    Stroke Sister    Arthritis Sister    Breast cancer Neg Hx      Teresa [name] is unaware of previous family history of genetic testing for hereditary cancer risks. There is no reported Ashkenazi Jewish ancestry. There is no known consanguinity.  GENETIC COUNSELING ASSESSMENT: Teresa [name] is a 73 y.o. female with a personal history of breast cancer which  is somewhat suggestive of a hereditary cancer syndrome and predisposition to cancer. We, therefore, discussed and recommended the following at today's visit.   DISCUSSION: We discussed that approximately 10% of breast cancer is hereditary. Most cases of hereditary breast cancer are associated with BRCA1/BRCA2 genes, although there are other genes associated with hereditary cancer as well. Cancers and risks are gene specific. We discussed that testing is beneficial for several reasons including knowing about cancer risks, identifying  potential screening and risk-reduction options that may be appropriate, and to understand if other family members could be at risk for cancer and allow them to undergo genetic testing.   We reviewed the characteristics, features and inheritance patterns of hereditary cancer syndromes. We also discussed genetic testing, including the appropriate family members to test, the process of testing, insurance coverage and turn-around-time for results. We discussed the implications of a negative, positive and/or variant of uncertain significant result. We recommended Teresa [name] pursue genetic testing for the [facility] CancerNext+RNA gene panel.   The [facility] CancerNext+RNAinsight Panel includes sequencing, rearrangement analysis, and RNA analysis for the following 40 genes: APC, ATM, BAP1, BARD1, BMPR1A, BRCA1, BRCA2, BRIP1, CDH1, CDKN2A, CHEK2, FH, FLCN, MET, MLH1, MSH2, MSH6, MUTYH, NF1, NTHL1, PALB2, PMS2, PTEN, RAD51C, RAD51D, RPS20, SMAD4, STK11, TP53, TSC1, TSC2, and VHL (sequencing and deletion/duplication); AXIN2, HOXB13, MBD4, MSH3, POLD1 and POLE (sequencing only); EPCAM and GREM1 (deletion/duplication only).  Based on Teresa [name]'s personal history of cancer, she meets medical criteria for genetic testing. Despite that she meets criteria, she may still have an out of pocket cost.  PLAN: After considering the risks, benefits, and limitations, [name] provided informed consent to pursue genetic testing and the blood sample was sent to [facility] for analysis of the CancerNext+RNA Panel. Results should be available within approximately 2-3 weeks' time, at which point they will be disclosed by telephone to Teresa [name], as will any additional recommendations warranted by these results. Teresa [name] will receive a summary of her genetic counseling visit and a copy of her results once available. This information will also be available in Epic.  ] Teresa [name]'s questions were answered to her  satisfaction today. Our contact information was provided should additional questions or concerns arise. Thank you for the referral and allowing us  to share in the care of your patient.   Dena Cary, MS, [facility] Genetic Counselor [email or web address] Phone: [phone]  I personally spent a total of 30 minutes in the care of the patient today including counseling and educating and documenting clinical information in the EHR.   _______________________________________________________________________ For Office Staff:  Number of people involved in session: 2 - patient's daughter was present Was an Intern/ student involved with case: no

## 2024-08-11 ENCOUNTER — Encounter: Payer: Self-pay | Admitting: Oncology

## 2024-08-11 NOTE — Telephone Encounter (Signed)
 Fmla Signed and faxed

## 2024-08-12 ENCOUNTER — Emergency Department

## 2024-08-12 ENCOUNTER — Other Ambulatory Visit: Payer: Self-pay

## 2024-08-12 ENCOUNTER — Observation Stay
Admission: EM | Admit: 2024-08-12 | Discharge: 2024-08-13 | Disposition: A | Discharge status: Home / Self Care | Attending: Internal Medicine | Admitting: Internal Medicine

## 2024-08-12 DIAGNOSIS — R5383 Other fatigue: Secondary | ICD-10-CM | POA: Diagnosis present

## 2024-08-12 DIAGNOSIS — E8729 Other acidosis: Principal | ICD-10-CM | POA: Insufficient documentation

## 2024-08-12 DIAGNOSIS — Z1152 Encounter for screening for COVID-19: Secondary | ICD-10-CM | POA: Diagnosis not present

## 2024-08-12 DIAGNOSIS — Z79899 Other long term (current) drug therapy: Secondary | ICD-10-CM | POA: Diagnosis not present

## 2024-08-12 DIAGNOSIS — E1142 Type 2 diabetes mellitus with diabetic polyneuropathy: Secondary | ICD-10-CM | POA: Insufficient documentation

## 2024-08-12 DIAGNOSIS — Z7901 Long term (current) use of anticoagulants: Secondary | ICD-10-CM | POA: Insufficient documentation

## 2024-08-12 DIAGNOSIS — R17 Unspecified jaundice: Secondary | ICD-10-CM

## 2024-08-12 DIAGNOSIS — C50919 Malignant neoplasm of unspecified site of unspecified female breast: Secondary | ICD-10-CM | POA: Insufficient documentation

## 2024-08-12 DIAGNOSIS — K760 Fatty (change of) liver, not elsewhere classified: Secondary | ICD-10-CM | POA: Diagnosis not present

## 2024-08-12 DIAGNOSIS — R7402 Elevation of levels of lactic acid dehydrogenase (LDH): Secondary | ICD-10-CM

## 2024-08-12 DIAGNOSIS — E872 Acidosis, unspecified: Principal | ICD-10-CM | POA: Insufficient documentation

## 2024-08-12 DIAGNOSIS — K219 Gastro-esophageal reflux disease without esophagitis: Secondary | ICD-10-CM | POA: Diagnosis not present

## 2024-08-12 DIAGNOSIS — R6510 Systemic inflammatory response syndrome (SIRS) of non-infectious origin without acute organ dysfunction: Secondary | ICD-10-CM | POA: Diagnosis present

## 2024-08-12 DIAGNOSIS — E785 Hyperlipidemia, unspecified: Secondary | ICD-10-CM | POA: Diagnosis not present

## 2024-08-12 DIAGNOSIS — R531 Weakness: Principal | ICD-10-CM | POA: Insufficient documentation

## 2024-08-12 DIAGNOSIS — D72829 Elevated white blood cell count, unspecified: Secondary | ICD-10-CM

## 2024-08-12 DIAGNOSIS — E1140 Type 2 diabetes mellitus with diabetic neuropathy, unspecified: Secondary | ICD-10-CM

## 2024-08-12 DIAGNOSIS — F413 Other mixed anxiety disorders: Secondary | ICD-10-CM

## 2024-08-12 DIAGNOSIS — F418 Other specified anxiety disorders: Secondary | ICD-10-CM | POA: Insufficient documentation

## 2024-08-12 DIAGNOSIS — R069 Unspecified abnormalities of breathing: Secondary | ICD-10-CM

## 2024-08-12 HISTORY — DX: Elevated white blood cell count, unspecified: D72.829

## 2024-08-12 HISTORY — DX: Unspecified jaundice: R17

## 2024-08-12 HISTORY — DX: Acidosis, unspecified: E87.20

## 2024-08-12 LAB — COMPREHENSIVE METABOLIC PANEL WITH GFR
ALT: 24 U/L (ref 0–44)
AST: 36 U/L (ref 15–41)
Albumin: 3.5 g/dL (ref 3.5–5.0)
Alkaline Phosphatase: 56 U/L (ref 38–126)
Anion gap: 9 (ref 5–15)
BUN: 16 mg/dL (ref 8–23)
CO2: 25 mmol/L (ref 22–32)
Calcium: 8.1 mg/dL — ABNORMAL LOW (ref 8.9–10.3)
Chloride: 102 mmol/L (ref 98–111)
Creatinine, Ser: 0.59 mg/dL (ref 0.44–1.00)
GFR, Estimated: >60 mL/min (ref >60)
Glucose, Bld: 110 mg/dL — ABNORMAL HIGH (ref 70–99)
Potassium: 4.9 mmol/L (ref 3.5–5.1)
Sodium: 136 mmol/L (ref 135–145)
Total Bilirubin: 2 mg/dL — ABNORMAL HIGH (ref 0.0–1.2)
Total Protein: 6.4 g/dL — ABNORMAL LOW (ref 6.5–8.1)

## 2024-08-12 LAB — RESP PANEL BY RT-PCR (RSV, FLU A&B, COVID)  RVPGX2
Influenza A by PCR: NEGATIVE
Influenza B by PCR: NEGATIVE
Resp Syncytial Virus by PCR: NEGATIVE
SARS Coronavirus 2 by RT PCR: NEGATIVE

## 2024-08-12 LAB — URINALYSIS, ROUTINE W REFLEX MICROSCOPIC
Bilirubin Urine: NEGATIVE
Glucose, UA: NEGATIVE mg/dL
Ketones, ur: NEGATIVE mg/dL
Leukocytes,Ua: NEGATIVE
Nitrite: NEGATIVE
Protein, ur: NEGATIVE mg/dL
Specific Gravity, Urine: 1.009 (ref 1.005–1.030)
pH: 7 (ref 5.0–8.0)

## 2024-08-12 LAB — CBC
HCT: 38.6 % (ref 36.0–46.0)
Hemoglobin: 12.4 g/dL (ref 12.0–15.0)
MCH: 26.3 pg (ref 26.0–34.0)
MCHC: 32.1 g/dL (ref 30.0–36.0)
MCV: 81.8 fL (ref 80.0–100.0)
Platelets: 254 K/uL (ref 150–400)
RBC: 4.72 MIL/uL (ref 3.87–5.11)
RDW: 14.9 % (ref 11.5–15.5)
WBC: 23.1 K/uL — ABNORMAL HIGH (ref 4.0–10.5)
nRBC: 0 % (ref 0.0–0.2)

## 2024-08-12 LAB — LACTIC ACID, PLASMA
Lactic Acid, Venous: 2.1 mmol/L (ref 0.5–1.9)
Lactic Acid, Venous: 3 mmol/L (ref 0.5–1.9)
Lactic Acid, Venous: 3.7 mmol/L (ref 0.5–1.9)
Lactic Acid, Venous: 2.1 mmol/L (ref 0.5–1.9)

## 2024-08-12 LAB — MRSA NEXT GEN BY PCR, NASAL: MRSA by PCR Next Gen: NOT DETECTED

## 2024-08-12 MED ORDER — VENLAFAXINE HCL ER 75 MG PO CP24
75.0000 mg | ORAL_CAPSULE | Freq: Every day | ORAL | Status: DC
  Administered 2024-08-13: 75 mg via ORAL
  Filled 2024-08-12: qty 1

## 2024-08-12 MED ORDER — LORAZEPAM 0.5 MG PO TABS: 0.5000 mg | ORAL_TABLET | Freq: Three times a day (TID) | ORAL | Status: DC | PRN

## 2024-08-12 MED ORDER — SODIUM CHLORIDE 0.9 % IV SOLN
2.0000 g | Freq: Once | INTRAVENOUS | Status: AC
  Administered 2024-08-12: 2 g via INTRAVENOUS
  Filled 2024-08-12: qty 12.5

## 2024-08-12 MED ORDER — METRONIDAZOLE 500 MG/100ML IV SOLN
500.0000 mg | Freq: Once | INTRAVENOUS | Status: AC
  Administered 2024-08-12: 500 mg via INTRAVENOUS
  Filled 2024-08-12: qty 100

## 2024-08-12 MED ORDER — METRONIDAZOLE 500 MG/100ML IV SOLN
500.0000 mg | Freq: Two times a day (BID) | INTRAVENOUS | Status: DC
  Administered 2024-08-13: 500 mg via INTRAVENOUS
  Filled 2024-08-12 (×2): qty 100

## 2024-08-12 MED ORDER — ENOXAPARIN SODIUM 40 MG/0.4ML IJ SOSY
40.0000 mg | PREFILLED_SYRINGE | INTRAMUSCULAR | Status: DC
  Administered 2024-08-12: 40 mg via SUBCUTANEOUS
  Filled 2024-08-12: qty 0.4

## 2024-08-12 MED ORDER — ORAL CARE MOUTH RINSE: 15.0000 mL | OROMUCOSAL | Status: DC | PRN

## 2024-08-12 MED ORDER — ACETAMINOPHEN 325 MG PO TABS: 650.0000 mg | ORAL_TABLET | Freq: Four times a day (QID) | ORAL | Status: DC | PRN

## 2024-08-12 MED ORDER — ACETAMINOPHEN 650 MG RE SUPP: 650.0000 mg | Freq: Four times a day (QID) | RECTAL | Status: DC | PRN

## 2024-08-12 MED ORDER — ONDANSETRON HCL 4 MG/2ML IJ SOLN: 4.0000 mg | Freq: Four times a day (QID) | INTRAMUSCULAR | Status: DC | PRN

## 2024-08-12 MED ORDER — VANCOMYCIN HCL 1500 MG/300ML IV SOLN
1500.0000 mg | INTRAVENOUS | Status: DC
  Filled 2024-08-12: qty 300

## 2024-08-12 MED ORDER — SODIUM CHLORIDE 0.9 % IV SOLN: INTRAVENOUS | Status: AC

## 2024-08-12 MED ORDER — CEFEPIME HCL 2 G IV SOLR
2.0000 g | Freq: Three times a day (TID) | INTRAVENOUS | Status: DC
  Administered 2024-08-12 – 2024-08-13 (×2): 2 g via INTRAVENOUS
  Filled 2024-08-12 (×4): qty 12.5

## 2024-08-12 MED ORDER — VANCOMYCIN HCL 1750 MG/350ML IV SOLN
1750.0000 mg | Freq: Once | INTRAVENOUS | Status: AC
  Administered 2024-08-12: 1750 mg via INTRAVENOUS
  Filled 2024-08-12 (×2): qty 350

## 2024-08-12 MED ORDER — ONDANSETRON HCL 4 MG PO TABS: 4.0000 mg | ORAL_TABLET | Freq: Four times a day (QID) | ORAL | Status: DC | PRN

## 2024-08-12 MED ORDER — LACTATED RINGERS IV BOLUS
1000.0000 mL | Freq: Once | INTRAVENOUS | Status: AC
  Administered 2024-08-12: 1000 mL via INTRAVENOUS

## 2024-08-12 MED ORDER — SODIUM CHLORIDE 0.9 % IV BOLUS
1000.0000 mL | Freq: Once | INTRAVENOUS | Status: AC
  Administered 2024-08-12: 1000 mL via INTRAVENOUS

## 2024-08-12 MED ORDER — SODIUM CHLORIDE 0.9 % IV BOLUS (SEPSIS)
500.0000 mL | Freq: Once | INTRAVENOUS | Status: AC
  Administered 2024-08-12: 500 mL via INTRAVENOUS

## 2024-08-12 MED ORDER — VANCOMYCIN HCL IN DEXTROSE 1-5 GM/200ML-% IV SOLN: 1000.0000 mg | Freq: Once | INTRAVENOUS | Status: DC

## 2024-08-12 NOTE — Assessment & Plan Note (Addendum)
 Etiology workup in progress, blood cultures x 2 are in process, check COVID/influenza A/influenza B/RSV PCR, right upper quadrant ultrasound, MRSA PCR Patient meets SIRS criteria with increased respiration rate, leukocytosis In setting of immunocompromise, we will admit to inpatient telemetry medical Continue with broad-spectrum ABX Complete sepsis bolus totaling 2.5 L Sodium chloride  infusion at 125 mL/h, 1 day ordered Maintain MAP > 65

## 2024-08-12 NOTE — Plan of Care (Signed)

## 2024-08-12 NOTE — Hospital Course (Signed)
 Ms. Teresa [name] is a 73 year old female with history of stage III breast cancer currently getting treatment, hyperlipidemia, GERD, history of status post lumbar laminectomy, who presents ED for chief concerns of lethargy and generalized weakness.  Vitals in the ED showed t of 98.6, rr 20, hr 84, blood pressure 157/78, SpO2 100% on room air.  Serum sodium is 136, potassium 4.9, chloride 102, bicarb 25, BUN of 16, serum creatinine 0.59, eGFR greater than 60, nonfasting blood glucose 110, WBC 23.1, hemoglobin 12.4, platelets of 254.  AST is 36, ALT 24.  T. bili was elevated at 2.0.  Chest x-ray 2 view: Was read as no acute cardiopulmonary abnormality.  ED treatment: Cefepime , Flagyl , vancomycin  per pharmacy, LR 1 L bolus, sodium chloride  1 L bolus.

## 2024-08-12 NOTE — Progress Notes (Signed)
 Elink following for sepsis protocol.

## 2024-08-12 NOTE — ED Provider Notes (Signed)
 [facility] Provider Note    Event Date/Time   First MD Initiated Contact with Patient 08/12/24 [phone]     (approximate)   History   Chief Complaint Weakness   HPI  Teresa [name] is a 73 y.o. female with past medical history of diabetes and breast cancer who presents to the ED complaining of weakness.  Husband at bedside states that patient seemed fine when she went to bed last night, but he had a difficult time waking her up this morning and she has seemed slower to respond.  Patient reports feeling generally weak, but otherwise states that she feels well and that she is currently hungry.  She denies any recent fevers, cough, chest pain, shortness of breath, nausea, vomiting, diarrhea, or dysuria.  She has been eating and drinking normally, but is currently receiving chemotherapy for her breast cancer.     Physical Exam   Triage Vital Signs: ED Triage Vitals  Encounter Vitals Group     BP 08/12/24 0900 (!) 157/78     Girls Systolic BP Percentile --      Girls Diastolic BP Percentile --      Boys Systolic BP Percentile --      Boys Diastolic BP Percentile --      Pulse Rate 08/12/24 0900 84     Resp 08/12/24 0900 20     Temp 08/12/24 0902 98.6 F (37 C)     Temp Source 08/12/24 0902 Oral     SpO2 08/12/24 0900 100 %     Weight 08/12/24 0903 175 lb 12.8 oz (79.7 kg)     Height 08/12/24 0903 5' 4 (1.626 m)     Head Circumference --      Peak Flow --      Pain Score 08/12/24 0903 0     Pain Loc --      Pain Education --      Exclude from Growth Chart --     Most recent vital signs: Vitals:   08/12/24 1130 08/12/24 1249  BP: (!) 137/57   Pulse: 87   Resp: (!) 25   Temp:  98.5 F (36.9 C)  SpO2: 100%     Constitutional: Alert and oriented. Eyes: Conjunctivae are normal. Head: Atraumatic. Nose: No congestion/rhinnorhea. Mouth/Throat: Mucous membranes are moist.  Cardiovascular: Normal rate, regular rhythm. Grossly normal heart  sounds.  2+ radial pulses bilaterally. Respiratory: Normal respiratory effort.  No retractions. Lungs CTAB. Gastrointestinal: Soft and nontender. No distention. Musculoskeletal: No lower extremity tenderness nor edema.  Neurologic:  Normal speech and language. No gross focal neurologic deficits are appreciated.    ED Results / Procedures / Treatments   Labs (all labs ordered are listed, but only abnormal results are displayed) Labs Reviewed  COMPREHENSIVE METABOLIC PANEL WITH GFR - Abnormal; Notable for the following components:      Result Value   Glucose, Bld 110 (*)    Calcium 8.1 (*)    Total Protein 6.4 (*)    Total Bilirubin 2.0 (*)    All other components within normal limits  CBC - Abnormal; Notable for the following components:   WBC 23.1 (*)    All other components within normal limits  URINALYSIS, ROUTINE W REFLEX MICROSCOPIC - Abnormal; Notable for the following components:   Color, Urine STRAW (*)    APPearance CLEAR (*)    Hgb urine dipstick MODERATE (*)    Bacteria, UA RARE (*)    All other components  within normal limits  LACTIC ACID, PLASMA - Abnormal; Notable for the following components:   Lactic Acid, Venous 2.1 (*)    All other components within normal limits  LACTIC ACID, PLASMA - Abnormal; Notable for the following components:   Lactic Acid, Venous 3.0 (*)    All other components within normal limits  LACTIC ACID, PLASMA - Abnormal; Notable for the following components:   Lactic Acid, Venous 3.7 (*)    All other components within normal limits  CULTURE, BLOOD (ROUTINE X 2)  CULTURE, BLOOD (ROUTINE X 2)  RESP PANEL BY RT-PCR (RSV, FLU A&B, COVID)  [id number]  LACTIC ACID, PLASMA     EKG  ED ECG REPORT I, Carlin Palin, the attending physician, personally viewed and interpreted this ECG.   Date: 08/12/2024  EKG Time: 9:05  Rate: 84  Rhythm: normal sinus rhythm  Axis: Normal  Intervals:none  ST&T Change: None  RADIOLOGY Chest x-ray reviewed  and interpreted by me with no infiltrate, edema, or effusion.  PROCEDURES:  Critical Care performed: No  Procedures   MEDICATIONS ORDERED IN ED: Medications  ceFEPIme  (MAXIPIME ) 2 g in sodium chloride  0.9 % 100 mL IVPB (2 g Intravenous New Bag/Given 08/12/24 1248)  metroNIDAZOLE  (FLAGYL ) IVPB 500 mg (has no administration in time range)  vancomycin  (VANCOCIN ) IVPB 1000 mg/200 mL premix (has no administration in time range)  LORazepam  (ATIVAN ) tablet 0.5 mg (has no administration in time range)  venlafaxine  XR (EFFEXOR -XR) 24 hr capsule 75 mg (has no administration in time range)  acetaminophen  (TYLENOL ) tablet 650 mg (has no administration in time range)    Or  acetaminophen  (TYLENOL ) suppository 650 mg (has no administration in time range)  ondansetron  (ZOFRAN ) tablet 4 mg (has no administration in time range)    Or  ondansetron  (ZOFRAN ) injection 4 mg (has no administration in time range)  enoxaparin  (LOVENOX ) injection 40 mg (has no administration in time range)  sodium chloride  0.9 % bolus 500 mL (has no administration in time range)  metroNIDAZOLE  (FLAGYL ) IVPB 500 mg (has no administration in time range)  lactated ringers  bolus 1,000 mL (0 mLs Intravenous Stopped 08/12/24 1210)  sodium chloride  0.9 % bolus 1,000 mL (1,000 mLs Intravenous New Bag/Given 08/12/24 1148)     IMPRESSION / MDM / ASSESSMENT AND PLAN / ED COURSE  I reviewed the triage vital signs and the nursing notes.                              73 y.o. female with past medical history of diabetes and breast cancer who presents to the ED complaining of generalized weakness starting this morning.  Patient's presentation is most consistent with acute presentation with potential threat to life or bodily function.  Differential diagnosis includes, but is not limited to, sepsis, UTI, pneumonia, anemia, electrolyte abnormality, AKI, dehydration.  Patient nontoxic-appearing and in no acute distress, vital signs are  reassuring and do not appear concerning for sepsis.  EKG without evidence of arrhythmia or ischemia and I doubt cardiac etiology.  Labs do show leukocytosis of 23, however patient received dose of pegfilgrastim  earlier this week, which could be the cause.  We will evaluate for infectious process with chest x-ray and urinalysis, additional labs without significant anemia, electrolyte abnormality, or AKI.  Bilirubin noted to be mildly elevated, however hemolysis noted on lab draw, which could affect this result.  No abdominal pain to suggest biliary obstruction.  Chest x-ray and  urinalysis with no evidence of infection, but lactic acid noted to be mildly elevated and uptrending despite IV fluid bolus.  Given patient immunocompromise, will start on broad-spectrum antibiotics for now and case discussed with hospitalist for admission.      FINAL CLINICAL IMPRESSION(S) / ED DIAGNOSES   Final diagnoses:  Generalized weakness  Lactic acidosis     Rx / DC Orders   ED Discharge Orders     None        Note:  This document was prepared using Dragon voice recognition software and may include unintentional dictation errors.   Willo Dunnings, MD 08/12/24 1254

## 2024-08-12 NOTE — Assessment & Plan Note (Signed)
 Likely multifactorial as patient received pegfilgrastim  infusion on 08/10/2024

## 2024-08-12 NOTE — ED Notes (Signed)
Pts brief changed and pericare provided

## 2024-08-12 NOTE — Progress Notes (Signed)
 Pharmacy Antibiotic Note  Teresa [name] is a 73 y.o. female admitted on 08/12/2024 with sepsis.  Pharmacy has been consulted for cefepime  and vancomycin  dosing.  Plan: Start cefepime  2 g IV every 8 hours Give vancomycin  load of 1750 mg IV x1 Start vancomycin  1500 mg IV every 24 hours (eAUC 516.3, Scr 0.8, IBW used, Vd 0.72 L/kg) Continue to monitor renal function, culture data, clinical status, and antibiotic LOT  Height: 5' 4 (162.6 cm) Weight: 79.7 kg (175 lb 12.8 oz) IBW/kg (Calculated) : 54.7  Temp (24hrs), Avg:98.6 F (37 C), Min:98.5 F (36.9 C), Max:98.6 F (37 C)  Recent Labs  Lab 08/08/24 0848 08/08/24 0849 08/12/24 0910 08/12/24 1012 08/12/24 1042 08/12/24 1150  WBC  --  8.3 23.1*  --   --   --   CREATININE 0.71  --  0.59  --   --   --   LATICACIDVEN  --   --   --  2.1* 3.0* 3.7*    Estimated Creatinine Clearance: 65.9 mL/min (by C-G formula based on SCr of 0.59 mg/dL).    Allergies  Allergen Reactions   Aleve [Naproxen Sodium]     Facial swelling   Aspirin     REACTION: nausea    Antimicrobials this admission: vancomycin  10/18 >>  cefepime  10/18 >>  Metronidazole  10/18>>  Dose adjustments this admission:N/A  Microbiology results: 10/18 BCx: pending 10/18 MRSA PCR: pending  Thank you for involving pharmacy in this patient's care.   Damien Napoleon, PharmD Clinical Pharmacist 08/12/2024 1:06 PM

## 2024-08-12 NOTE — Progress Notes (Signed)
 CODE SEPSIS - PHARMACY COMMUNICATION  **Broad Spectrum Antibiotics should be administered within 1 hour of Sepsis diagnosis**  Time Code Sepsis Called/Page Received: 1232  Antibiotics Ordered: cefepime , metronidazole , and vancomycin   Time of 1st antibiotic administration: 1248  Additional action taken by pharmacy: None  If necessary, Name of Provider/Nurse Contacted: None    Damien Napoleon ,PharmD Clinical Pharmacist  08/12/2024  12:45 PM

## 2024-08-12 NOTE — ED Notes (Signed)
 Lab called to send phlebotomy to collect blood cultures due to difficult collection.

## 2024-08-12 NOTE — Assessment & Plan Note (Signed)
 A1c was 6.2% on 05/22/2024

## 2024-08-12 NOTE — Assessment & Plan Note (Addendum)
 Etiology workup in progress Blood cultures x 2 are in process Rest of infection is unclear at this time Check COVID/influenza A/influenza B/RSV PCR, MRSA PCR, right upper quadrant ultrasound Recheck lactic acid at 1450 on admission  Addendum: Fourth lactic acid came down to 2.1

## 2024-08-12 NOTE — ED Triage Notes (Signed)
 Pt to ED via [facility] from home. Per EMS pt began feeling weak this morning.  Pt has stage 3 breast cancer. Last treatment on Tuesday.  Pt reports generalized weakness. Denies any cough, fever, abdominal pain, CP, or SOB.  Ems vitals  T 98 CBG 156 BP 139/83 SPO2 96% RA  HR 76
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
## 2024-08-12 NOTE — Assessment & Plan Note (Addendum)
 PDMP reviewed Home venlafaxine  75 mg daily resumed Home lorazepam  0.5 mg p.o. every 8 hours as needed for anxiety resumed

## 2024-08-13 ENCOUNTER — Inpatient Hospital Stay

## 2024-08-13 DIAGNOSIS — D72829 Elevated white blood cell count, unspecified: Secondary | ICD-10-CM

## 2024-08-13 DIAGNOSIS — E1142 Type 2 diabetes mellitus with diabetic polyneuropathy: Secondary | ICD-10-CM | POA: Diagnosis not present

## 2024-08-13 DIAGNOSIS — R17 Unspecified jaundice: Secondary | ICD-10-CM

## 2024-08-13 DIAGNOSIS — E872 Acidosis, unspecified: Secondary | ICD-10-CM

## 2024-08-13 DIAGNOSIS — K219 Gastro-esophageal reflux disease without esophagitis: Secondary | ICD-10-CM | POA: Diagnosis not present

## 2024-08-13 DIAGNOSIS — R531 Weakness: Principal | ICD-10-CM

## 2024-08-13 DIAGNOSIS — F418 Other specified anxiety disorders: Secondary | ICD-10-CM | POA: Diagnosis not present

## 2024-08-13 DIAGNOSIS — E785 Hyperlipidemia, unspecified: Secondary | ICD-10-CM

## 2024-08-13 DIAGNOSIS — R6510 Systemic inflammatory response syndrome (SIRS) of non-infectious origin without acute organ dysfunction: Secondary | ICD-10-CM | POA: Diagnosis not present

## 2024-08-13 LAB — BASIC METABOLIC PANEL WITH GFR
Anion gap: 10 (ref 5–15)
BUN: 12 mg/dL (ref 8–23)
CO2: 23 mmol/L (ref 22–32)
Calcium: 8.4 mg/dL — ABNORMAL LOW (ref 8.9–10.3)
Chloride: 106 mmol/L (ref 98–111)
Creatinine, Ser: 0.53 mg/dL (ref 0.44–1.00)
GFR, Estimated: >60 mL/min (ref >60)
Glucose, Bld: 116 mg/dL — ABNORMAL HIGH (ref 70–99)
Potassium: 3.8 mmol/L (ref 3.5–5.1)
Sodium: 139 mmol/L (ref 135–145)

## 2024-08-13 LAB — HEPATIC FUNCTION PANEL
ALT: 16 U/L (ref 0–44)
AST: 20 U/L (ref 15–41)
Albumin: 3 g/dL — ABNORMAL LOW (ref 3.5–5.0)
Alkaline Phosphatase: 59 U/L (ref 38–126)
Bilirubin, Direct: 0.1 mg/dL (ref 0.0–0.2)
Indirect Bilirubin: 0.4 mg/dL (ref 0.3–0.9)
Total Bilirubin: 0.5 mg/dL (ref 0.0–1.2)
Total Protein: 5.3 g/dL — ABNORMAL LOW (ref 6.5–8.1)

## 2024-08-13 LAB — CBC
HCT: 32.9 % — ABNORMAL LOW (ref 36.0–46.0)
Hemoglobin: 10.7 g/dL — ABNORMAL LOW (ref 12.0–15.0)
MCH: 26.8 pg (ref 26.0–34.0)
MCHC: 32.5 g/dL (ref 30.0–36.0)
MCV: 82.3 fL (ref 80.0–100.0)
Platelets: 215 K/uL (ref 150–400)
RBC: 4 MIL/uL (ref 3.87–5.11)
RDW: 15 % (ref 11.5–15.5)
WBC: 14.8 K/uL — ABNORMAL HIGH (ref 4.0–10.5)
nRBC: 0 % (ref 0.0–0.2)

## 2024-08-13 LAB — LACTIC ACID, PLASMA
Lactic Acid, Venous: 2.7 mmol/L (ref 0.5–1.9)
Lactic Acid, Venous: 1.8 mmol/L (ref 0.5–1.9)

## 2024-08-13 MED ORDER — ATENOLOL 25 MG PO TABS
25.0000 mg | ORAL_TABLET | Freq: Every day | ORAL | Status: DC
  Administered 2024-08-13: 25 mg via ORAL
  Filled 2024-08-13: qty 1

## 2024-08-13 MED ORDER — LACTATED RINGERS IV BOLUS
1000.0000 mL | Freq: Once | INTRAVENOUS | Status: AC
  Administered 2024-08-13: 1000 mL via INTRAVENOUS

## 2024-08-13 NOTE — Plan of Care (Signed)
IV removed, discharge instructions reviewed and patient discharged to home with husband

## 2024-08-13 NOTE — Discharge Summary (Signed)
 Physician Discharge Summary   Patient: Teresa [name] MRN: [id number] DOB: November 28, 1950  Admit date:     08/12/2024  Discharge date: 08/13/24  Discharge Physician: Amaryllis Dare   PCP: Diedra Lame, MD   Recommendations at discharge:  Please obtain CBC and CMP on follow-up Follow-up with primary care provider Follow-up with oncology  Discharge Diagnoses: Principal Problem:   SIRS (systemic inflammatory response syndrome) (HCC) Active Problems:   Lactic acidosis   Serum total bilirubin elevated   Leukocytosis   Dyslipidemia   GERD   Type 2 diabetes mellitus with peripheral neuropathy (HCC)   Depression with anxiety   Generalized weakness   Hospital Course: Teresa [name] is a 73 year old female with history of stage III breast cancer currently getting treatment, hyperlipidemia, GERD, history of status post lumbar laminectomy, who presents ED for chief concerns of lethargy and generalized weakness.  Vitals in the ED showed t of 98.6, rr 20, hr 84, blood pressure 157/78, SpO2 100% on room air.  Serum sodium is 136, potassium 4.9, chloride 102, bicarb 25, BUN of 16, serum creatinine 0.59, eGFR greater than 60, nonfasting blood glucose 110, WBC 23.1, hemoglobin 12.4, platelets of 254. AST is 36, ALT 24.  T. bili was elevated at 2.0.  Chest x-ray 2 view: Was read as no acute cardiopulmonary abnormality.  ED treatment: Cefepime , Flagyl , vancomycin  per pharmacy, LR 1 L bolus, sodium chloride  1 L bolus.  10/19: Vital stable, improving leukocytosis, still no infection found, preliminary blood culture negative.  Lactic acid can be due to malignancy, dehydration or decreased liver clearance in her case.  Resolved with IV fluid.  Leukocytosis is likely secondary to  pegfilgrastim  infusion which she received on 10/16.  Clinically appears much improved and would like to go home.  As there was no obvious infection, sepsis ruled out.  Patient was instructed to keep herself  well-hydrated and follow-up with her providers for further assistance.  Assessment and Plan: * SIRS (systemic inflammatory response syndrome) (HCC) Likely secondary to recent chemotherapy.  All infectious workup came back negative.  Sepsis ruled out.  Discontinued antibiotics.  Lactic acidosis Resolved with IV fluid.  Leukocytosis Likely multifactorial as patient received pegfilgrastim  infusion on 08/10/2024-improving  Type 2 diabetes mellitus with peripheral neuropathy (HCC) A1c was 6.2% on 05/22/2024  Depression with anxiety PDMP reviewed Home venlafaxine  75 mg daily resumed Home lorazepam  0.5 mg p.o. every 8 hours as needed for anxiety resumed  Consultants: None Procedures performed: None Disposition: Home health Diet recommendation:  Discharge Diet Orders (From admission, onward)     Start     Ordered   08/13/24 0000  Diet - low sodium heart healthy        08/13/24 1657           Regular diet DISCHARGE MEDICATION: Allergies as of 08/13/2024       Reactions   Aleve [naproxen Sodium]    Facial swelling   Aspirin    REACTION: nausea        Medication List     TAKE these medications    acetaminophen  500 MG tablet Commonly known as: TYLENOL  Take 1 tablet (500 mg total) by mouth every 4 (four) hours as needed.   atenolol  25 MG tablet Commonly known as: TENORMIN  Take 25 mg by mouth daily.   cetirizine  10 MG tablet Commonly known as: ZYRTEC  Take 1 tablet (10 mg total) by mouth daily. What changed:  when to take this reasons to take this   dexamethasone  4  MG tablet Commonly known as: DECADRON  Take 2 tabs by mouth 2 times daily starting day before chemo. Then take 2 tabs daily for 2 days starting day after chemo. Take with food.   lidocaine -prilocaine  cream Commonly known as: EMLA  Apply to affected area once   LORazepam  0.5 MG tablet Commonly known as: ATIVAN  Take 1 tablet (0.5 mg total) by mouth every 8 (eight) hours as needed for anxiety.    multivitamin with minerals Tabs tablet Take 1 tablet by mouth daily.   ondansetron  8 MG tablet Commonly known as: Zofran  Take 1 tablet (8 mg total) by mouth every 8 (eight) hours as needed for nausea or vomiting. Start on the third day after chemotherapy.   prochlorperazine  10 MG tablet Commonly known as: COMPAZINE  Take 1 tablet (10 mg total) by mouth every 6 (six) hours as needed for nausea or vomiting.   traMADol  50 MG tablet Commonly known as: Ultram  Take 1 tablet (50 mg total) by mouth every 8 (eight) hours as needed.   venlafaxine  XR 75 MG 24 hr capsule Commonly known as: EFFEXOR -XR Take 75 mg by mouth daily.        Follow-up Information     Diedra Lame, MD. Schedule an appointment as soon as possible for a visit in 1 week(s).   Specialty: Family Medicine Contact information: [address] [facility] [place] [address] [phone]                Discharge Exam: Teresa [name]   08/12/24 0903  Weight: 79.7 kg   General.  Frail lady, in no acute distress. Pulmonary.  Lungs clear bilaterally, normal respiratory effort. CV.  Regular rate and rhythm, no JVD, rub or murmur. Abdomen.  Soft, nontender, nondistended, BS positive. CNS.  Alert and oriented .  No focal neurologic deficit. Extremities.  No edema, no cyanosis, pulses intact and symmetrical. Psychiatry.  Judgment and insight appears normal.   Condition at discharge: stable  The results of significant diagnostics from this hospitalization (including imaging, microbiology, ancillary and laboratory) are listed below for reference.   Imaging Studies: US  Abdomen Limited RUQ (LIVER/GB) Result Date: 08/13/2024 CLINICAL DATA:  Increased total bilirubin. EXAM: ULTRASOUND ABDOMEN LIMITED RIGHT UPPER QUADRANT COMPARISON:  Abdomen CT 05/13/2024 FINDINGS: Gallbladder: No gallstones or gallbladder wall thickening. No pericholecystic fluid. The sonographer reports  no sonographic Murphy's sign. Common bile duct: Diameter: 5 mm Liver: Mild diffuse increase in echotexture suggests fatty deposition. No focal abnormality identified. Portal vein is patent on color Doppler imaging with normal direction of blood flow towards the liver. Other: None. IMPRESSION: Mild hepatic steatosis. Otherwise unremarkable exam. Electronically Signed   By: Camellia Candle M.D.   On: 08/13/2024 07:58   DG Chest 2 View Result Date: 08/12/2024 CLINICAL DATA:  Weakness EXAM: CHEST - 2 VIEW COMPARISON:  July 07, 2024 FINDINGS: The cardiomediastinal silhouette is unchanged in contour.Atherosclerotic calcifications of the aorta. RIGHT chest port with tip terminating over the superior cavoatrial junction. No pleural effusion. No pneumothorax. No acute pleuroparenchymal abnormality. Multilevel degenerative changes of the thoracic spine. Status post posterior fixation of the lower thoracic spine. IMPRESSION: No acute cardiopulmonary abnormality. Electronically Signed   By: Corean Salter M.D.   On: 08/12/2024 09:47    Microbiology: Results for orders placed or performed during the hospital encounter of 08/12/24  Culture, blood (routine x 2)     Status: None (Preliminary result)   Collection Time: 08/12/24 10:37 AM   Specimen: BLOOD  Result Value Ref Range Status   Specimen Description BLOOD BLOOD RIGHT HAND  Final   Special Requests   Final    BOTTLES DRAWN AEROBIC AND ANAEROBIC Blood Culture results may not be optimal due to an inadequate volume of blood received in culture bottles   Culture   Final    NO GROWTH < 24 HOURS Performed at [facility], [address] [place], [place] [address]    Report Status PENDING  Incomplete  Culture, blood (routine x 2)     Status: None (Preliminary result)   Collection Time: 08/12/24 10:40 AM   Specimen: BLOOD  Result Value Ref Range Status   Specimen Description BLOOD BLOOD RIGHT HAND  Final   Special Requests   Final     BOTTLES DRAWN AEROBIC AND ANAEROBIC Blood Culture results may not be optimal due to an inadequate volume of blood received in culture bottles   Culture   Final    NO GROWTH < 24 HOURS Performed at [facility], [address] [place], [place] [address]    Report Status PENDING  Incomplete  Resp panel by RT-PCR (RSV, Flu A&B, Covid) Nasal Mucosa     Status: None   Collection Time: 08/12/24  2:13 PM   Specimen: Nasal Mucosa; Nasal Swab  Result Value Ref Range Status   SARS Coronavirus 2 by RT PCR NEGATIVE NEGATIVE Final    Comment: (NOTE) SARS-CoV-2 target nucleic acids are NOT DETECTED.  The SARS-CoV-2 RNA is generally detectable in upper respiratory specimens during the acute phase of infection. The lowest concentration of SARS-CoV-2 viral copies this assay can detect is 138 copies/mL. A negative result does not preclude SARS-Cov-2 infection and should not be used as the sole basis for treatment or other patient management decisions. A negative result may occur with  improper specimen collection/handling, submission of specimen other than nasopharyngeal swab, presence of viral mutation(s) within the areas targeted by this assay, and inadequate number of viral copies(<138 copies/mL). A negative result must be combined with clinical observations, patient history, and epidemiological information. The expected result is Negative.  Fact Sheet for Patients:  BloggerCourse.com  Fact Sheet for Healthcare Providers:  SeriousBroker.it  This test is no t yet approved or cleared by the [place]  FDA and  has been authorized for detection and/or diagnosis of SARS-CoV-2 by FDA under an Emergency Use Authorization (EUA). This EUA will remain  in effect (meaning this test can be used) for the duration of the COVID-19 declaration under Section 564(b)(1) of the Act, 21 U.S.C.section 360bbb-3(b)(1), unless the authorization is  terminated  or revoked sooner.       Influenza A by PCR NEGATIVE NEGATIVE Final   Influenza B by PCR NEGATIVE NEGATIVE Final    Comment: (NOTE) The Xpert Xpress SARS-CoV-2/FLU/RSV plus assay is intended as an aid in the diagnosis of influenza from Nasopharyngeal swab specimens and should not be used as a sole basis for treatment. Nasal washings and aspirates are unacceptable for Xpert Xpress SARS-CoV-2/FLU/RSV testing.  Fact Sheet for Patients: BloggerCourse.com  Fact Sheet for Healthcare Providers: SeriousBroker.it  This test is not yet approved or cleared by the [place]  FDA and has been authorized for detection and/or diagnosis of SARS-CoV-2 by FDA under an Emergency Use Authorization (EUA). This EUA will remain in effect (meaning this test can be used) for the duration of the COVID-19 declaration under Section 564(b)(1) of the Act, 21 U.S.C. section 360bbb-3(b)(1), unless the authorization is terminated or revoked.  Resp Syncytial Virus by PCR NEGATIVE NEGATIVE Final    Comment: (NOTE) Fact Sheet for Patients: BloggerCourse.com  Fact Sheet for Healthcare Providers: SeriousBroker.it  This test is not yet approved or cleared by the [place]  FDA and has been authorized for detection and/or diagnosis of SARS-CoV-2 by FDA under an Emergency Use Authorization (EUA). This EUA will remain in effect (meaning this test can be used) for the duration of the COVID-19 declaration under Section 564(b)(1) of the Act, 21 U.S.C. section 360bbb-3(b)(1), unless the authorization is terminated or revoked.  Performed at [facility], [address] [place], [place] [address]   MRSA Next Gen by PCR, Nasal     Status: None   Collection Time: 08/12/24  2:13 PM   Specimen: Nasal Mucosa; Nasal Swab  Result Value Ref Range Status   MRSA by PCR Next Gen NOT DETECTED NOT  DETECTED Final    Comment: (NOTE) The GeneXpert MRSA Assay (FDA approved for NASAL specimens only), is one component of a comprehensive MRSA colonization surveillance program. It is not intended to diagnose MRSA infection nor to guide or monitor treatment for MRSA infections. Test performance is not FDA approved in patients less than 56 years old. Performed at [facility], [address] [place], [place] [address]     Labs: CBC: Recent Labs  Lab 08/08/24 [id number] 08/12/24 0910 08/13/24 0438  WBC 8.3 23.1* 14.8*  NEUTROABS 6.5  --   --   HGB 11.7* 12.4 10.7*  HCT 35.4* 38.6 32.9*  MCV 81.0 81.8 82.3  PLT 265 254 215   Basic Metabolic Panel: Recent Labs  Lab 08/08/24 0848 08/12/24 0910 08/13/24 0438  NA 135 136 139  K 3.5 4.9 3.8  CL 103 102 106  CO2 23 25 23   GLUCOSE 146* 110* 116*  BUN 13 16 12   CREATININE 0.71 0.59 0.53  CALCIUM 8.8* 8.1* 8.4*   Liver Function Tests: Recent Labs  Lab 08/08/24 0848 08/12/24 0910 08/13/24 0438  AST 29 36 20  ALT 18 24 16   ALKPHOS 57 56 59  BILITOT 0.6 2.0* 0.5  PROT 6.6 6.4* 5.3*  ALBUMIN 3.8 3.5 3.0*   CBG: No results for input(s): GLUCAP in the last 168 hours.  Discharge time spent: greater than 30 minutes.  This record has been created using Conservation officer, historic buildings. Errors have been sought and corrected,but may not always be located. Such creation errors do not reflect on the standard of care.   Signed: Amaryllis Dare, MD [facility] 08/13/2024

## 2024-08-13 NOTE — TOC Transition Note (Signed)
 Transition of Care [facility]) - Discharge Note   Patient Details  Name: Teresa [name] MRN: [id number] Date of Birth: 07/07/1951  Transition of Care [facility]) CM/SW Contact:  [place] L Renetta Suman, LCSW Phone Number: 08/13/2024, 5:42 PM   Clinical Narrative:     Discharge orders are in. Patient already established with [facility]. The liaison was notified in the patient portal.   No further TOC needs. TOC logging off.         Patient Goals and CMS Choice            Discharge Placement                       Discharge Plan and Services Additional resources added to the After Visit Summary for                                       Social Drivers of Health (SDOH) Interventions SDOH Screenings   Food Insecurity: No Food Insecurity (08/12/2024)  Housing: Low Risk  (08/12/2024)  Transportation Needs: No Transportation Needs (08/12/2024)  Utilities: Not At Risk (08/12/2024)  Depression (PHQ2-9): Low Risk  (08/08/2024)  Financial Resource Strain: Patient Declined (12/07/2023)   Received from [facility]  Social Connections: Patient Unable To Answer (08/12/2024)  Tobacco Use: Unknown (08/12/2024)  Health Literacy: Adequate Health Literacy (07/05/2024)     Readmission Risk Interventions     No data to display

## 2024-08-13 NOTE — Progress Notes (Signed)
 Physical Therapy Evaluation Patient Details Name: Teresa [name] MRN: [id number] DOB: [birth year] Today's Date: 08/13/2024  History of Present Illness  Pt is a 73 y/o female admitted secondary to lethargy and weakness. Pt found to have SIRS, sepsis in the setting of immunocompromised. Respiratory panel was negative. PMH including but not limited to stage III breast CA currently receiving treatment, hyperlipidemia, GERD, history of status post lumbar laminectomy.   Clinical Impression  Pt presented supine in bed with HOB elevated, awake and willing to participate in therapy session. Prior to admission, pt reported that she ambulated with use of RW at all times and requires assistance with ADLs/IADLs from family. Pt lives with her husband in a single level home with three steps to enter (with rails). At the time of evaluation, pt able to complete bed mobility with supervision, transfers with min A with use of RW and was able to ambulate to and from the bathroom with use of RW and CGA from PT for safety. Pt is currently active with HHPT and HHOT services. PT recommending that she continues with those [facility] services upon d/c with support from her family at home as well. Pt would continue to benefit from skilled physical therapy services at this time while admitted and after d/c to address the below listed limitations in order to improve overall safety and independence with functional mobility.         If plan is discharge home, recommend the following: A little help with walking and/or transfers;A little help with bathing/dressing/bathroom;Assistance with cooking/housework;Assist for transportation;Help with stairs or ramp for entrance   Can travel by private vehicle        Equipment Recommendations None recommended by PT  Recommendations for Other Services       Functional Status Assessment Patient has had a recent decline in their functional status and demonstrates the ability to make  significant improvements in function in a reasonable and predictable amount of time.     Precautions / Restrictions Precautions Precautions: Fall Recall of Precautions/Restrictions: Intact Restrictions Weight Bearing Restrictions Per Provider Order: No      Mobility  Bed Mobility Overal bed mobility: Needs Assistance Bed Mobility: Supine to Sit, Sit to Supine     Supine to sit: Supervision, HOB elevated, Used rails Sit to supine: Supervision   General bed mobility comments: increased time and effort needed    Transfers Overall transfer level: Needs assistance Equipment used: Rolling walker (2 wheels) Transfers: Sit to/from Stand Sit to Stand: Min assist           General transfer comment: increased time and effort needed, cueing for sequencing, min A needed to power up into standing x1 from EOB and x1 from toilet    Ambulation/Gait Ambulation/Gait assistance: Contact guard assist Gait Distance (Feet): 40 Feet (20' x2) Assistive device: Rolling walker (2 wheels) Gait Pattern/deviations: Step-through pattern, Decreased step length - right, Decreased step length - left, Decreased stride length Gait velocity: decreased     General Gait Details: pt with slow, cautious gait with use of RW, no overt LOB or need for external physical assistance, VC's to maintain proximity to RW, CGA for safety  Stairs            Wheelchair Mobility     Tilt Bed    Modified Rankin (Stroke Patients Only)       Balance Overall balance assessment: Needs assistance Sitting-balance support: Feet supported Sitting balance-Leahy Scale: Good     Standing balance support: During functional  activity, Bilateral upper extremity supported, Single extremity supported Standing balance-Leahy Scale: Poor                               Pertinent Vitals/Pain Pain Assessment Pain Assessment: No/denies pain    Home Living Family/patient expects to be discharged to:: Private  residence Living Arrangements: Spouse/significant other Available Help at Discharge: Family;Available PRN/intermittently Type of Home: House Home Access: Stairs to enter Entrance Stairs-Rails: Doctor, general practice of Steps: 3   Home Layout: One level Home Equipment: Agricultural consultant (2 wheels);Rollator (4 wheels);BSC/3in1;Shower seat;Wheelchair - manual      Prior Function Prior Level of Function : Needs assist             Mobility Comments: pt mostly ambulates with use of RW for household distances, has a manual w/c that she uses for community level distances (such as for church); pt receives HHPT 3x/wk and HHOT 2x/wk per family report ADLs Comments: requires assistance from daughter or husband for bathing/dressing. pt's husband does the housework, cooks, drives     Extremity/Trunk Assessment   Upper Extremity Assessment Upper Extremity Assessment: Generalized weakness    Lower Extremity Assessment Lower Extremity Assessment: Generalized weakness       Communication   Communication Communication: No apparent difficulties Factors Affecting Communication: Other (comment) (soft spoken)    Cognition Arousal: Alert Behavior During Therapy: WFL for tasks assessed/performed   PT - Cognitive impairments: Sequencing, Problem solving                       PT - Cognition Comments: pt requiring frequent verbal, visual and gestural cueing for sequencing and problem solving with functional mobility tasks and IV line management Following commands: Intact       Cueing Cueing Techniques: Verbal cues, Gestural cues, Visual cues     General Comments      Exercises     Assessment/Plan    PT Assessment Patient needs continued PT services  PT Problem List Decreased strength;Decreased range of motion;Decreased activity tolerance;Decreased mobility;Decreased balance;Decreased coordination;Decreased knowledge of use of DME;Decreased safety awareness;Decreased  knowledge of precautions       PT Treatment Interventions DME instruction;Gait training;Stair training;Functional mobility training;Therapeutic activities;Therapeutic exercise;Balance training;Neuromuscular re-education;Patient/family education    PT Goals (Current goals can be found in the Care Plan section)  Acute Rehab PT Goals Patient Stated Goal: return home today PT Goal Formulation: With patient/family Time For Goal Achievement: 08/27/24 Potential to Achieve Goals: Good    Frequency Min 2X/week     Co-evaluation               AM-PAC PT 6 Clicks Mobility  Outcome Measure Help needed turning from your back to your side while in a flat bed without using bedrails?: A Little Help needed moving from lying on your back to sitting on the side of a flat bed without using bedrails?: A Little Help needed moving to and from a bed to a chair (including a wheelchair)?: A Little Help needed standing up from a chair using your arms (e.g., wheelchair or bedside chair)?: A Little Help needed to walk in hospital room?: A Little Help needed climbing 3-5 steps with a railing? : A Lot 6 Click Score: 17    End of Session   Activity Tolerance: Patient tolerated treatment well Patient left: in bed;with call bell/phone within reach;with bed alarm set;with family/visitor present Nurse Communication: Mobility status PT Visit  Diagnosis: Other abnormalities of gait and mobility (R26.89)    Time: 8940-8873 PT Time Calculation (min) (ACUTE ONLY): 27 min   Charges:   PT Evaluation $PT Eval Low Complexity: 1 Low PT Treatments $Gait Training: 8-22 mins PT General Charges $$ ACUTE PT VISIT: 1 Visit         Delon DELENA KLEIN, DPT  Acute Rehabilitation Services Office [phone]   Delon HERO Yosselin Zoeller 08/13/2024, 11:50 AM

## 2024-08-13 NOTE — Discharge Instructions (Signed)
 [facility] notified of your discharge. Home Health services will resume with this agency.

## 2024-08-13 NOTE — Care Management CC44 (Signed)
 Condition Code 44 Documentation Completed  Patient Details  Name: Teresa [name] MRN: [id number] Date of Birth: 04/18/1951   Condition Code 44 given:   Yes Patient signature on Condition Code 44 notice:   Yes Documentation of 2 MD's agreement:   Yes Code 44 added to claim:   Yes    Victory Jackquline RAMAN, RN 08/13/2024, 5:52 PM

## 2024-08-13 NOTE — Care Management Obs Status (Signed)
 MEDICARE OBSERVATION STATUS NOTIFICATION   Patient Details  Name: Teresa [name] MRN: [id number] Date of Birth: [birth year]   Medicare Observation Status Notification Given:       Victory Jackquline RAMAN, RN 08/13/2024, 5:52 PM

## 2024-08-16 ENCOUNTER — Inpatient Hospital Stay: Admitting: Licensed Clinical Social Worker

## 2024-08-16 NOTE — Progress Notes (Signed)
 [facility] CSW Progress Note  Clinical Child psychotherapist contacted caregiver by phone to follow-up on need for community resources.    Interventions: Referred patient to community resources: [facility] Department of Social Services for food stamps to qualify for the [employer].  Provided daughter, [name], with information regarding private pay for home care.  Patient currently receives PT from [facility].        Follow Up Plan:  CSW will follow-up with patient by phone     Macario CHRISTELLA Au, LCSW Clinical Social Worker [facility]

## 2024-08-17 LAB — CULTURE, BLOOD (ROUTINE X 2)
Culture: NO GROWTH
Culture: NO GROWTH

## 2024-08-22 NOTE — Care Management Obs Status (Signed)
 MEDICARE OBSERVATION STATUS NOTIFICATION   Patient Details  Name: Teresa [name] MRN: [id number] Date of Birth: 1951/05/28   Medicare Observation Status Notification Given:  Yes    Victory Jackquline RAMAN, RN 08/22/2024, 10:44 AM

## 2024-08-25 ENCOUNTER — Encounter: Payer: Self-pay | Admitting: Oncology

## 2024-08-29 ENCOUNTER — Other Ambulatory Visit: Payer: Self-pay | Admitting: Oncology

## 2024-08-29 ENCOUNTER — Other Ambulatory Visit: Payer: Self-pay

## 2024-08-29 ENCOUNTER — Encounter: Payer: Self-pay | Admitting: Oncology

## 2024-08-29 ENCOUNTER — Inpatient Hospital Stay (HOSPITAL_BASED_OUTPATIENT_CLINIC_OR_DEPARTMENT_OTHER): Admitting: Oncology

## 2024-08-29 ENCOUNTER — Inpatient Hospital Stay: Attending: Oncology

## 2024-08-29 ENCOUNTER — Inpatient Hospital Stay

## 2024-08-29 VITALS — BP 142/78 | HR 95 | Temp 98.3°F | Resp 16 | Wt 164.5 lb

## 2024-08-29 VITALS — BP 158/87 | HR 83 | Temp 96.6°F | Resp 17

## 2024-08-29 DIAGNOSIS — Z5112 Encounter for antineoplastic immunotherapy: Secondary | ICD-10-CM

## 2024-08-29 DIAGNOSIS — Z90710 Acquired absence of both cervix and uterus: Secondary | ICD-10-CM | POA: Diagnosis not present

## 2024-08-29 DIAGNOSIS — Z171 Estrogen receptor negative status [ER-]: Secondary | ICD-10-CM | POA: Insufficient documentation

## 2024-08-29 DIAGNOSIS — Z1731 Human epidermal growth factor receptor 2 positive status: Secondary | ICD-10-CM | POA: Insufficient documentation

## 2024-08-29 DIAGNOSIS — C50412 Malignant neoplasm of upper-outer quadrant of left female breast: Secondary | ICD-10-CM | POA: Insufficient documentation

## 2024-08-29 DIAGNOSIS — Z5189 Encounter for other specified aftercare: Secondary | ICD-10-CM | POA: Diagnosis not present

## 2024-08-29 DIAGNOSIS — Z5111 Encounter for antineoplastic chemotherapy: Secondary | ICD-10-CM | POA: Diagnosis present

## 2024-08-29 DIAGNOSIS — E876 Hypokalemia: Secondary | ICD-10-CM | POA: Diagnosis not present

## 2024-08-29 DIAGNOSIS — Z1722 Progesterone receptor negative status: Secondary | ICD-10-CM | POA: Diagnosis not present

## 2024-08-29 LAB — CMP (CANCER CENTER ONLY)
ALT: 17 U/L (ref 0–44)
AST: 26 U/L (ref 15–41)
Albumin: 4 g/dL (ref 3.5–5.0)
Alkaline Phosphatase: 55 U/L (ref 38–126)
Anion gap: 9 (ref 5–15)
BUN: 16 mg/dL (ref 8–23)
CO2: 24 mmol/L (ref 22–32)
Calcium: 9.5 mg/dL (ref 8.9–10.3)
Chloride: 105 mmol/L (ref 98–111)
Creatinine: 0.74 mg/dL (ref 0.44–1.00)
GFR, Estimated: >60 mL/min (ref >60)
Glucose, Bld: 161 mg/dL — ABNORMAL HIGH (ref 70–99)
Potassium: 3.2 mmol/L — ABNORMAL LOW (ref 3.5–5.1)
Sodium: 138 mmol/L (ref 135–145)
Total Bilirubin: 1 mg/dL (ref 0.0–1.2)
Total Protein: 6.6 g/dL (ref 6.5–8.1)

## 2024-08-29 LAB — CBC WITH DIFFERENTIAL (CANCER CENTER ONLY)
Abs Immature Granulocytes: 0.22 K/uL — ABNORMAL HIGH (ref 0.00–0.07)
Basophils Absolute: 0 K/uL (ref 0.0–0.1)
Basophils Relative: 0 %
Eosinophils Absolute: 0 K/uL (ref 0.0–0.5)
Eosinophils Relative: 0 %
HCT: 35 % — ABNORMAL LOW (ref 36.0–46.0)
Hemoglobin: 11.4 g/dL — ABNORMAL LOW (ref 12.0–15.0)
Immature Granulocytes: 2 %
Lymphocytes Relative: 10 %
Lymphs Abs: 1.2 K/uL (ref 0.7–4.0)
MCH: 26.8 pg (ref 26.0–34.0)
MCHC: 32.6 g/dL (ref 30.0–36.0)
MCV: 82.2 fL (ref 80.0–100.0)
Monocytes Absolute: 0.4 K/uL (ref 0.1–1.0)
Monocytes Relative: 3 %
Neutro Abs: 10.1 K/uL — ABNORMAL HIGH (ref 1.7–7.7)
Neutrophils Relative %: 85 %
Platelet Count: 275 K/uL (ref 150–400)
RBC: 4.26 MIL/uL (ref 3.87–5.11)
RDW: 16.8 % — ABNORMAL HIGH (ref 11.5–15.5)
WBC Count: 11.9 K/uL — ABNORMAL HIGH (ref 4.0–10.5)
nRBC: 0 % (ref 0.0–0.2)

## 2024-08-29 MED ORDER — DEXAMETHASONE SOD PHOSPHATE PF 10 MG/ML IJ SOLN
10.0000 mg | Freq: Once | INTRAMUSCULAR | Status: AC
  Administered 2024-08-29: 10 mg via INTRAVENOUS

## 2024-08-29 MED ORDER — SODIUM CHLORIDE 0.9 % IV SOLN
75.0000 mg/m2 | Freq: Once | INTRAVENOUS | Status: AC
  Administered 2024-08-29: 139 mg via INTRAVENOUS
  Filled 2024-08-29: qty 13.9

## 2024-08-29 MED ORDER — DIPHENHYDRAMINE HCL 25 MG PO CAPS
50.0000 mg | ORAL_CAPSULE | Freq: Once | ORAL | Status: AC
  Administered 2024-08-29: 50 mg via ORAL
  Filled 2024-08-29: qty 2

## 2024-08-29 MED ORDER — PERTUZ-TRASTUZ-HYALURON-ZZXF 60-60-2000 MG-MG-U/ML CHEMO ~~LOC~~ SOLN
600.0000 mg | Freq: Once | SUBCUTANEOUS | Status: AC
  Administered 2024-08-29: 600 mg via SUBCUTANEOUS
  Filled 2024-08-29: qty 10

## 2024-08-29 MED ORDER — POTASSIUM CHLORIDE CRYS ER 10 MEQ PO TBCR: 10.0000 meq | EXTENDED_RELEASE_TABLET | Freq: Every day | ORAL | 0 refills | Status: DC

## 2024-08-29 MED ORDER — PALONOSETRON HCL INJECTION 0.25 MG/5ML
0.2500 mg | Freq: Once | INTRAVENOUS | Status: AC
  Administered 2024-08-29: 0.25 mg via INTRAVENOUS
  Filled 2024-08-29: qty 5

## 2024-08-29 MED ORDER — SODIUM CHLORIDE 0.9 % IV SOLN
INTRAVENOUS | Status: AC
  Filled 2024-08-29: qty 250

## 2024-08-29 MED ORDER — APREPITANT 130 MG/18ML IV EMUL
130.0000 mg | Freq: Once | INTRAVENOUS | Status: AC
  Administered 2024-08-29: 130 mg via INTRAVENOUS
  Filled 2024-08-29: qty 18

## 2024-08-29 MED ORDER — SODIUM CHLORIDE 0.9 % IV SOLN
432.5000 mg | Freq: Once | INTRAVENOUS | Status: AC
  Administered 2024-08-29: 430 mg via INTRAVENOUS
  Filled 2024-08-29: qty 43

## 2024-08-29 MED ORDER — ACETAMINOPHEN 325 MG PO TABS
650.0000 mg | ORAL_TABLET | Freq: Once | ORAL | Status: AC
  Administered 2024-08-29: 650 mg via ORAL
  Filled 2024-08-29: qty 2

## 2024-08-29 NOTE — Addendum Note (Signed)
 Addended by: Leanthony Rhett A on: 08/29/2024 01:43 PM   Modules accepted: Orders

## 2024-08-29 NOTE — Progress Notes (Signed)
 Hematology/Oncology Consult note [facility]  [phone] [phone]  Patient Care Team: Diedra Lame, MD as PCP - General (Family Medicine) Georgina Shasta POUR, RN as Oncology Nurse Navigator Melanee Annah BROCKS, MD as Consulting Physician (Oncology)   Name of the patient: Teresa [name]  [id number]  November 10, 1950   Date of visit: 08/29/24  Diagnosis-  Cancer Staging  Malignant neoplasm of upper-outer quadrant of left breast in female, estrogen receptor negative (HCC) Staging form: Breast, AJCC 8th Edition - Clinical stage from 06/30/2024: Stage IIB (cT3, cN0, cM0, G3, ER-, PR-, HER2+) - Signed by Melanee Annah BROCKS, MD on 06/30/2024 Stage prefix: Initial diagnosis Histologic grading system: 3 grade system    Chief complaint/ Reason for visit-on treatment assessment prior to cycle 3 of neoadjuvant TCHP chemotherapy  Heme/Onc history:  patient is a 73 year old female with a past medical history significant for chronic back pain status post lumbar laminectomy, type 2 diabetes who presented to the ER with symptoms of bodyaches and underwent CT chest abdomen and pelvis with contrast which incidentally showed left breast mass but no evidence of metastatic disease.     Patient subsequently underwent mammogram and ultrasound which showed 1. 8 cm group of highly suspicious UPPER-OUTER LEFT breast calcifications with 4.5 cm mass identified sonographically along the posterior aspect of the calcifications and a 2.3 cm mass identified sonographically along the anterior aspect of these calcifications. Tissue sampling of the posterior aspect of the 1-2 o'clock position mass 5 cm from the nipple in the anterior aspect of the 2.3 cm 2 o'clock position mass 3 cm from the nipple is recommended.2. No abnormal appearing LEFT axillary lymph nodes.3. No suspicious mammographic findings within the RIGHT breast.   Patient underwent core biopsy of both the breast masses.  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  The 2.3 cm mass was positive for grade 2 invasive mammary carcinoma and receptor testing was not done on that specimen.   CT chest abdomen pelvis With contrast from July 2025 did not show any evidence of distant metastatic disease.  MRI bilateral breast also confirmed known findings from mammogram with total mass and non-mass enhancement extending up to 9 cm in the upper outer quadrant of the left breast.  No malignancy noted on the right side.  Bone scan showed nonspecific uptake in the sternomanubrial junction which could be subacute fracture versus arthropathy.  Baseline echocardiogram shows normal EF of 60 to 65%    Interval history- Discussed the use of AI scribe software for clinical note transcription with the patient, who gave verbal consent to proceed.  History of Present Illness   Teresa [name] is a 73 year old female undergoing chemotherapy for breast cancer who presents for her third chemotherapy session.  After her second chemotherapy session, she experienced weakness and went to the ER. An extensive workup was performed, which did not reveal any source of infection. Initially, she was given antibiotics, but these were discontinued after the infection workup returned negative. She received fluids, which improved her condition, and she was subsequently discharged home.  She mentions experiencing dark spots on her hands, which she attributes to the treatment. No significant nausea, vomiting, tingling, numbness, leg swelling, skin rash, or diarrhea.       ECOG PS- 2 Pain scale- 4 Opioid associated constipation- no  Review of systems- Review of Systems  Constitutional:  Negative for chills,  fever, malaise/fatigue and weight loss.  HENT:  Negative for congestion, ear discharge and nosebleeds.   Eyes:  Negative for blurred vision.  Respiratory:  Negative for cough, hemoptysis,  sputum production, shortness of breath and wheezing.   Cardiovascular:  Negative for chest pain, palpitations, orthopnea and claudication.  Gastrointestinal:  Negative for abdominal pain, blood in stool, constipation, diarrhea, heartburn, melena, nausea and vomiting.  Genitourinary:  Negative for dysuria, flank pain, frequency, hematuria and urgency.  Musculoskeletal:  Positive for back pain. Negative for joint pain and myalgias.  Skin:  Negative for rash.  Neurological:  Negative for dizziness, tingling, focal weakness, seizures, weakness and headaches.  Endo/Heme/Allergies:  Does not bruise/bleed easily.  Psychiatric/Behavioral:  Negative for depression and suicidal ideas. The patient does not have insomnia.       Allergies  Allergen Reactions   Aleve [Naproxen Sodium]     Facial swelling   Aspirin     REACTION: nausea     Past Medical History:  Diagnosis Date   Acute encephalopathy 05/13/2024   Allergic rhinitis    Anxiety    B12 deficiency 05/13/2024   Cystitis, acute 04/02/2014   Depression    Diabetes mellitus without complication (HCC)    GERD (gastroesophageal reflux disease)    Head injury    History of falling 10/06/2012   Hyperlipidemia    Lumbar radiculopathy 02/20/2015   Malignant neoplasm of upper-outer quadrant of left breast in female, estrogen receptor negative (HCC) 06/30/2024   SIRS (systemic inflammatory response syndrome) (HCC) 05/13/2024   Status post lumbar laminectomy 10/06/2012     Past Surgical History:  Procedure Laterality Date   ABDOMINAL HYSTERECTOMY     BACK SURGERY     2000   BREAST BIOPSY Left 06/20/2024   u/s bx x2 clip ribbon and coil   BREAST BIOPSY Left 06/20/2024   US  LT BREAST BX W LOC DEV EA ADD LESION IMG BX SPEC US  GUIDE 06/20/2024 [facility]   BREAST BIOPSY Left 06/20/2024   US  LT BREAST BX W LOC DEV 1ST LESION IMG BX SPEC US  GUIDE 06/20/2024 [facility]   PORTACATH PLACEMENT Left 07/07/2024   Procedure: INSERTION,  TUNNELED CENTRAL VENOUS DEVICE, WITH PORT;  Surgeon: Tye Millet, DO;  Location: [facility];  Service: General;  Laterality: Left;   TUBAL LIGATION      Social History   Socioeconomic History   Marital status: Married    Spouse name: Leatrice   Number of children: 3   Years of education: Not on file   Highest education level: Not on file  Occupational History    Comment: Works as social worker  Tobacco Use   Smoking status: Never   Smokeless tobacco: Not on file  Vaping Use   Vaping status: Never Used  Substance and Sexual Activity   Alcohol use: Never   Drug use: Never   Sexual activity: Not Currently  Other Topics Concern   Not on file  Social History Narrative   ** Merged History Encounter **       Social Drivers of Health   Financial Resource Strain: Patient Declined (12/07/2023)   Received from [facility]   Overall Financial Resource Strain (CARDIA)    Difficulty of Paying Living Expenses: Patient declined  Food Insecurity: No Food Insecurity (08/12/2024)   Hunger Vital Sign    Worried About Running Out of Food in the Last Year: Never true    Ran Out of Food in the Last Year: Never true  Transportation Needs:  No Transportation Needs (08/12/2024)   PRAPARE - Administrator, Civil Service (Medical): No    Lack of Transportation (Non-Medical): No  Physical Activity: Not on file  Stress: Not on file  Social Connections: Patient Unable To Answer (08/12/2024)   Social Connection and Isolation Panel    Frequency of Communication with Friends and Family: Patient unable to answer    Frequency of Social Gatherings with Friends and Family: Patient unable to answer    Attends Religious Services: Patient unable to answer    Active Member of Clubs or Organizations: Patient unable to answer    Attends Banker Meetings: Patient unable to answer    Marital Status: Patient unable to answer  Intimate Partner Violence: Not At Risk (08/12/2024)    Humiliation, Afraid, Rape, and Kick questionnaire    Fear of Current or Ex-Partner: No    Emotionally Abused: No    Physically Abused: No    Sexually Abused: No    Family History  Problem Relation Age of Onset   Healthy Mother    Arthritis Sister    Stroke Sister    Arthritis Sister    Breast cancer Neg Hx      Current Outpatient Medications:    acetaminophen  (TYLENOL ) 500 MG tablet, Take 1 tablet (500 mg total) by mouth every 4 (four) hours as needed., Disp: 120 tablet, Rfl: 1   atenolol  (TENORMIN ) 25 MG tablet, Take 25 mg by mouth daily., Disp: , Rfl:    cetirizine  (ZYRTEC ) 10 MG tablet, Take 1 tablet (10 mg total) by mouth daily. (Patient taking differently: Take 10 mg by mouth daily as needed for allergies.), Disp: 30 tablet, Rfl: 3   dexamethasone  (DECADRON ) 4 MG tablet, Take 2 tabs by mouth 2 times daily starting day before chemo. Then take 2 tabs daily for 2 days starting day after chemo. Take with food., Disp: 30 tablet, Rfl: 1   lidocaine -prilocaine  (EMLA ) cream, Apply to affected area once, Disp: 30 g, Rfl: 3   LORazepam  (ATIVAN ) 0.5 MG tablet, Take 1 tablet (0.5 mg total) by mouth every 8 (eight) hours as needed for anxiety., Disp: 30 tablet, Rfl: 0   Multiple Vitamin (MULTIVITAMIN WITH MINERALS) TABS tablet, Take 1 tablet by mouth daily., Disp: , Rfl:    ondansetron  (ZOFRAN ) 8 MG tablet, Take 1 tablet (8 mg total) by mouth every 8 (eight) hours as needed for nausea or vomiting. Start on the third day after chemotherapy., Disp: 30 tablet, Rfl: 1   prochlorperazine  (COMPAZINE ) 10 MG tablet, Take 1 tablet (10 mg total) by mouth every 6 (six) hours as needed for nausea or vomiting., Disp: 30 tablet, Rfl: 1   traMADol  (ULTRAM ) 50 MG tablet, Take 1 tablet (50 mg total) by mouth every 8 (eight) hours as needed., Disp: 6 tablet, Rfl: 0   venlafaxine  XR (EFFEXOR -XR) 75 MG 24 hr capsule, Take 75 mg by mouth daily., Disp: , Rfl:   Physical exam: There were no vitals filed for this  visit. Physical Exam Constitutional:      Comments: Sitting in a wheelchair.  Appears in no acute distress  Cardiovascular:     Rate and Rhythm: Normal rate and regular rhythm.     Heart sounds: Normal heart sounds.  Pulmonary:     Effort: Pulmonary effort is normal.     Breath sounds: Normal breath sounds.  Skin:    General: Skin is warm and dry.     Comments: Hyperpigmented lesions noted over bilateral  palms  Neurological:     Mental Status: She is alert and oriented to person, place, and time.   Breast exam is somewhat limited today as patient is unable to sit on the examination table.  Left breast mass was not easily palpable on today's exam  I have personally reviewed labs listed below:    Latest Ref Rng & Units 08/29/2024    9:03 AM  CMP  Glucose 70 - 99 mg/dL 838   BUN 8 - 23 mg/dL 16   Creatinine 9.55 - 1.00 mg/dL 9.25   Sodium 864 - 854 mmol/L 138   Potassium 3.5 - 5.1 mmol/L 3.2   Chloride 98 - 111 mmol/L 105   CO2 22 - 32 mmol/L 24   Calcium 8.9 - 10.3 mg/dL 9.5   Total Protein 6.5 - 8.1 g/dL 6.6   Total Bilirubin 0.0 - 1.2 mg/dL 1.0   Alkaline Phos 38 - 126 U/L 55   AST 15 - 41 U/L 26   ALT 0 - 44 U/L 17       Latest Ref Rng & Units 08/29/2024    9:03 AM  CBC  WBC 4.0 - 10.5 K/uL 11.9   Hemoglobin 12.0 - 15.0 g/dL 88.5   Hematocrit 63.9 - 46.0 % 35.0   Platelets 150 - 400 K/uL 275    I have personally reviewed Radiology images listed below: No images are attached to the encounter.  US  Abdomen Limited RUQ (LIVER/GB) Result Date: 08/13/2024 CLINICAL DATA:  Increased total bilirubin. EXAM: ULTRASOUND ABDOMEN LIMITED RIGHT UPPER QUADRANT COMPARISON:  Abdomen CT 05/13/2024 FINDINGS: Gallbladder: No gallstones or gallbladder wall thickening. No pericholecystic fluid. The sonographer reports no sonographic Murphy's sign. Common bile duct: Diameter: 5 mm Liver: Mild diffuse increase in echotexture suggests fatty deposition. No focal abnormality identified. Portal  vein is patent on color Doppler imaging with normal direction of blood flow towards the liver. Other: None. IMPRESSION: Mild hepatic steatosis. Otherwise unremarkable exam. Electronically Signed   By: Camellia Candle M.D.   On: 08/13/2024 07:58   DG Chest 2 View Result Date: 08/12/2024 CLINICAL DATA:  Weakness EXAM: CHEST - 2 VIEW COMPARISON:  July 07, 2024 FINDINGS: The cardiomediastinal silhouette is unchanged in contour.Atherosclerotic calcifications of the aorta. RIGHT chest port with tip terminating over the superior cavoatrial junction. No pleural effusion. No pneumothorax. No acute pleuroparenchymal abnormality. Multilevel degenerative changes of the thoracic spine. Status post posterior fixation of the lower thoracic spine. IMPRESSION: No acute cardiopulmonary abnormality. Electronically Signed   By: Corean Salter M.D.   On: 08/12/2024 09:47     Assessment and plan- Patient is a 73 y.o. 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  She is here for on treatment assessment prior to cycle 3 of neoadjuvant TCHP chemotherapy  Counts okay to proceed with cycle 3 of neoadjuvant TCHP chemotherapy today with Neulasta  support.  I am planning to get interim ultrasound of the left breast to assess response to treatment.  Clinically patient has responded to treatment well and left breast mass today is barely palpable on today's exam.  Patient will be seen by Dr. Rennie in 3 weeks for cycle 4 of neoadjuvant TCHP chemotherapy on day 1 and will receive Neulasta  on day 2 as we are closed on day 3 for Thanksgiving.  I will see her back in 6 weeks for cycle 5.  Planning to get  echocardiogram around 10/02/2024  Plan is to complete 6 cycles of neoadjuvant TCHP chemotherapy followed by consideration for surgery  Hypokalemia: We will give her prescription for oral potassium   Visit Diagnosis 1.  Encounter for antineoplastic chemotherapy   2. Encounter for monoclonal antibody treatment for malignancy   3. Malignant neoplasm of upper-outer quadrant of left breast in female, estrogen receptor negative (HCC)   4. Hypokalemia      Dr. Annah Skene, MD, MPH [facility] [phone] 08/29/2024 9:45 AM

## 2024-08-30 ENCOUNTER — Other Ambulatory Visit: Payer: Self-pay

## 2024-08-31 ENCOUNTER — Inpatient Hospital Stay

## 2024-08-31 DIAGNOSIS — C50412 Malignant neoplasm of upper-outer quadrant of left female breast: Secondary | ICD-10-CM

## 2024-08-31 DIAGNOSIS — Z5111 Encounter for antineoplastic chemotherapy: Secondary | ICD-10-CM | POA: Diagnosis not present

## 2024-08-31 MED ORDER — PEGFILGRASTIM-JMDB 6 MG/0.6ML ~~LOC~~ SOSY
6.0000 mg | PREFILLED_SYRINGE | Freq: Once | SUBCUTANEOUS | Status: AC
  Administered 2024-08-31: 6 mg via SUBCUTANEOUS
  Filled 2024-08-31: qty 0.6

## 2024-09-04 ENCOUNTER — Ambulatory Visit
Admission: RE | Admit: 2024-09-04 | Discharge: 2024-09-04 | Disposition: A | Discharge status: Home / Self Care | Source: Ambulatory Visit | Attending: Oncology | Admitting: Oncology

## 2024-09-04 DIAGNOSIS — C50412 Malignant neoplasm of upper-outer quadrant of left female breast: Secondary | ICD-10-CM | POA: Insufficient documentation

## 2024-09-04 DIAGNOSIS — Z171 Estrogen receptor negative status [ER-]: Secondary | ICD-10-CM | POA: Insufficient documentation

## 2024-09-05 ENCOUNTER — Telehealth: Payer: Self-pay | Admitting: Licensed Clinical Social Worker

## 2024-09-06 ENCOUNTER — Encounter: Payer: Self-pay | Admitting: Oncology

## 2024-09-06 NOTE — Telephone Encounter (Signed)
 I contacted Teresa [name] to discuss her genetic testing results. 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 Discussed briefly with Teresa [name] and her daughter, Teresa [name]. Will meet with both of them on 10/12/2024 for detailed discussion.   The test report has been scanned into EPIC and is located under the Molecular Pathology section of the Results Review tab.  A portion of the result report is included below for reference.      Teresa Cary, MS, [facility] Genetic Counselor [email or web address] Phone: [phone]

## 2024-09-18 NOTE — Progress Notes (Unsigned)
 Hematology/Oncology Consult note [facility]  [phone] [phone]  Patient Care Team: Diedra Lame, MD as PCP - General (Family Medicine) Georgina Shasta POUR, RN as Oncology Nurse Navigator Melanee Annah BROCKS, MD as Consulting Physician (Oncology)   Name of the patient: Teresa [name]  [id number]  1951/08/12   Date of visit: 09/19/24  Diagnosis-  Cancer Staging  Malignant neoplasm of upper-outer quadrant of left breast in female, estrogen receptor negative (HCC) Staging form: Breast, AJCC 8th Edition - Clinical stage from 06/30/2024: Stage IIB (cT3, cN0, cM0, G3, ER-, PR-, HER2+) - Signed by Melanee Annah BROCKS, MD on 06/30/2024 Stage prefix: Initial diagnosis Histologic grading system: 3 grade system    Chief complaint/ Reason for visit-on treatment assessment prior to cycle 3 of neoadjuvant TCHP chemotherapy  Heme/Onc history:  patient is a 74 year old female with a past medical history significant for chronic back pain status post lumbar laminectomy, type 2 diabetes who presented to the ER with symptoms of bodyaches and underwent CT chest abdomen and pelvis with contrast which incidentally showed left breast mass but no evidence of metastatic disease.     Patient subsequently underwent mammogram and ultrasound which showed 1. 8 cm group of highly suspicious UPPER-OUTER LEFT breast calcifications with 4.5 cm mass identified sonographically along the posterior aspect of the calcifications and a 2.3 cm mass identified sonographically along the anterior aspect of these calcifications. Tissue sampling of the posterior aspect of the 1-2 o'clock position mass 5 cm from the nipple in the anterior aspect of the 2.3 cm 2 o'clock position mass 3 cm from the nipple is recommended.2. No abnormal appearing LEFT axillary lymph nodes.3. No suspicious mammographic findings within the RIGHT breast.   Patient underwent core biopsy of both the breast masses.  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  The 2.3 cm mass was positive for grade 2 invasive mammary carcinoma and receptor testing was not done on that specimen.   CT chest abdomen pelvis With contrast from July 2025 did not show any evidence of distant metastatic disease.  MRI bilateral breast also confirmed known findings from mammogram with total mass and non-mass enhancement extending up to 9 cm in the upper outer quadrant of the left breast.  No malignancy noted on the right side.  Bone scan showed nonspecific uptake in the sternomanubrial junction which could be subacute fracture versus arthropathy.  Baseline echocardiogram shows normal EF of 60 to 65%    Interval history-.  History of Present Illness   Teresa [name] is a 73 year old female undergoing chemotherapy for breast cancer who presents for her fourth chemotherapy session.  After her second chemotherapy session, she experienced weakness and went to the ER. An extensive workup was performed, which did not reveal any source of infection. Initially, she was given antibiotics, but these were discontinued after the infection workup returned negative. She received fluids, which improved her condition, and she was subsequently discharged home.  She mentions experiencing dark spots on her hands, which she attributes to the treatment. No significant nausea, vomiting, tingling, numbness, leg swelling, skin rash, or diarrhea.  Patient overall doing well, her husband was with her today.  She does report some intermittent nausea that responds to PRN nausea medication at home.  No vomiting.   Denies any other episodes of weakness.  No fever/chills, no vomiting, no diarrhea.  ECOG PS- 2 Pain scale- 4 Opioid associated constipation- no  Review of systems- Review of Systems  Constitutional:  Negative for chills, fever, malaise/fatigue and weight loss.  HENT:  Negative for  congestion, ear discharge and nosebleeds.   Eyes:  Negative for blurred vision.  Respiratory:  Negative for cough, hemoptysis, sputum production, shortness of breath and wheezing.   Cardiovascular:  Negative for chest pain, palpitations, orthopnea and claudication.  Gastrointestinal:  Negative for abdominal pain, blood in stool, constipation, diarrhea, heartburn, melena, nausea and vomiting.  Genitourinary:  Negative for dysuria, flank pain, frequency, hematuria and urgency.  Musculoskeletal:  Positive for back pain. Negative for joint pain and myalgias.  Skin:  Negative for rash.  Neurological:  Negative for dizziness, tingling, focal weakness, seizures, weakness and headaches.  Endo/Heme/Allergies:  Does not bruise/bleed easily.  Psychiatric/Behavioral:  Negative for depression and suicidal ideas. The patient does not have insomnia.       Allergies  Allergen Reactions   Aleve [Naproxen Sodium]     Facial swelling   Aspirin     REACTION: nausea     Past Medical History:  Diagnosis Date   Acute encephalopathy 05/13/2024   Allergic rhinitis    Anxiety    B12 deficiency 05/13/2024   Cystitis, acute 04/02/2014   Depression    Diabetes mellitus without complication (HCC)    GERD (gastroesophageal reflux disease)    Head injury    History of falling 10/06/2012   Hyperlipidemia    Lumbar radiculopathy 02/20/2015   Malignant neoplasm of upper-outer quadrant of left breast in female, estrogen receptor negative (HCC) 06/30/2024   SIRS (systemic inflammatory response syndrome) (HCC) 05/13/2024   Status post lumbar laminectomy 10/06/2012     Past Surgical History:  Procedure Laterality Date   ABDOMINAL HYSTERECTOMY     BACK SURGERY     2000   BREAST BIOPSY Left 06/20/2024   u/s bx x2 clip ribbon and coil   BREAST BIOPSY Left 06/20/2024   US  LT BREAST BX W LOC DEV EA ADD LESION IMG BX SPEC US  GUIDE 06/20/2024 [facility]   BREAST BIOPSY Left 06/20/2024   US  LT BREAST BX W  LOC DEV 1ST LESION IMG BX SPEC US  GUIDE 06/20/2024 [facility]   PORTACATH PLACEMENT Left 07/07/2024   Procedure: INSERTION, TUNNELED CENTRAL VENOUS DEVICE, WITH PORT;  Surgeon: Tye Millet, DO;  Location: [facility];  Service: General;  Laterality: Left;   TUBAL LIGATION      Social History   Socioeconomic History   Marital status: Married    Spouse name: Leatrice   Number of children: 3   Years of education: Not on file   Highest education level: Not on file  Occupational History    Comment: Works as social worker  Tobacco Use   Smoking status: Never   Smokeless tobacco: Not on file  Vaping Use   Vaping status: Never Used  Substance and Sexual Activity   Alcohol use: Never   Drug use: Never   Sexual activity: Not Currently  Other Topics Concern   Not on file  Social History Narrative   ** Merged History Encounter **       Social Drivers of Health   Financial Resource Strain: Patient Declined (12/07/2023)   Received from [facility]   Overall Financial Resource Strain (CARDIA)    Difficulty of Paying Living Expenses: Patient declined  Food Insecurity: No Food Insecurity (08/12/2024)   Hunger Vital Sign    Worried About Running Out of  Food in the Last Year: Never true    Ran Out of Food in the Last Year: Never true  Transportation Needs: No Transportation Needs (08/12/2024)   PRAPARE - Administrator, Civil Service (Medical): No    Lack of Transportation (Non-Medical): No  Physical Activity: Not on file  Stress: Not on file  Social Connections: Patient Unable To Answer (08/12/2024)   Social Connection and Isolation Panel    Frequency of Communication with Friends and Family: Patient unable to answer    Frequency of Social Gatherings with Friends and Family: Patient unable to answer    Attends Religious Services: Patient unable to answer    Active Member of Clubs or Organizations: Patient unable to answer    Attends Banker  Meetings: Patient unable to answer    Marital Status: Patient unable to answer  Intimate Partner Violence: Not At Risk (08/12/2024)   Humiliation, Afraid, Rape, and Kick questionnaire    Fear of Current or Ex-Partner: No    Emotionally Abused: No    Physically Abused: No    Sexually Abused: No    Family History  Problem Relation Age of Onset   Healthy Mother    Arthritis Sister    Stroke Sister    Arthritis Sister    Breast cancer Neg Hx      Current Outpatient Medications:    acetaminophen  (TYLENOL ) 500 MG tablet, Take 1 tablet (500 mg total) by mouth every 4 (four) hours as needed., Disp: 120 tablet, Rfl: 1   cetirizine  (ZYRTEC ) 10 MG tablet, Take 1 tablet (10 mg total) by mouth daily. (Patient taking differently: Take 10 mg by mouth daily as needed for allergies.), Disp: 30 tablet, Rfl: 3   dexamethasone  (DECADRON ) 4 MG tablet, Take 2 tabs by mouth 2 times daily starting day before chemo. Then take 2 tabs daily for 2 days starting day after chemo. Take with food., Disp: 30 tablet, Rfl: 1   lidocaine -prilocaine  (EMLA ) cream, Apply to affected area once, Disp: 30 g, Rfl: 3   LORazepam  (ATIVAN ) 0.5 MG tablet, Take 1 tablet (0.5 mg total) by mouth every 8 (eight) hours as needed for anxiety., Disp: 30 tablet, Rfl: 0   Multiple Vitamin (MULTIVITAMIN WITH MINERALS) TABS tablet, Take 1 tablet by mouth daily., Disp: , Rfl:    ondansetron  (ZOFRAN ) 8 MG tablet, Take 1 tablet (8 mg total) by mouth every 8 (eight) hours as needed for nausea or vomiting. Start on the third day after chemotherapy., Disp: 30 tablet, Rfl: 1   potassium chloride  (KLOR-CON  M) 10 MEQ tablet, Take 1 tablet (10 mEq total) by mouth daily., Disp: 14 tablet, Rfl: 0   prochlorperazine  (COMPAZINE ) 10 MG tablet, Take 1 tablet (10 mg total) by mouth every 6 (six) hours as needed for nausea or vomiting., Disp: 30 tablet, Rfl: 1   traMADol  (ULTRAM ) 50 MG tablet, Take 1 tablet (50 mg total) by mouth every 8 (eight) hours as  needed., Disp: 6 tablet, Rfl: 0   venlafaxine  XR (EFFEXOR -XR) 75 MG 24 hr capsule, Take 75 mg by mouth daily., Disp: , Rfl:    atenolol  (TENORMIN ) 25 MG tablet, Take 25 mg by mouth daily. (Patient not taking: Reported on 09/19/2024), Disp: , Rfl:  No current facility-administered medications for this visit.  Facility-Administered Medications Ordered in Other Visits:    0.9 %  sodium chloride  infusion, , Intravenous, Continuous, Melanee Annah BROCKS, MD, Stopped at 08/29/24 1315   0.9 %  sodium chloride  infusion, ,  Intravenous, Continuous, Melanee Annah BROCKS, MD, Last Rate: 10 mL/hr at 09/19/24 1009, New Bag at 09/19/24 1009   CARBOplatin  (PARAPLATIN ) 430 mg in sodium chloride  0.9 % 250 mL chemo infusion, 430 mg, Intravenous, Once, Melanee Annah BROCKS, MD   DOCEtaxel  (TAXOTERE ) 139 mg in sodium chloride  0.9 % 250 mL chemo infusion, 75 mg/m2 (Treatment Plan Recorded), Intravenous, Once, Melanee Annah BROCKS, MD   pertuz-trastuz-hyaluron-zzxf (PHESGO ) 600-600-20000 MG-MG-U/10ML chemo SQ injection maintenance dose 600 mg, 600 mg, Subcutaneous, Once, Melanee Annah BROCKS, MD  Physical exam:  Vitals:   09/19/24 0857 09/19/24 0907  BP: (!) 157/91 (!) 152/92  Pulse: (!) 103   Resp: 16   Temp: 97.8 F (36.6 C)   TempSrc: Tympanic   SpO2: 98%   Weight: 162 lb 6.4 oz (73.7 kg)   Height: 5' 4 (1.626 m)    Physical Exam Constitutional:      Comments: Sitting in a wheelchair.  Appears in no acute distress  Cardiovascular:     Rate and Rhythm: Normal rate and regular rhythm.     Heart sounds: Normal heart sounds.  Pulmonary:     Effort: Pulmonary effort is normal.     Breath sounds: Normal breath sounds.  Skin:    General: Skin is warm and dry.     Comments: Hyperpigmented lesions noted over bilateral palms  Neurological:     Mental Status: She is alert and oriented to person, place, and time.   Breast exam is somewhat limited today as patient is unable to sit on the examination table.  Left breast mass was not  easily palpable on today's exam  I have personally reviewed labs listed below:    Latest Ref Rng & Units 09/19/2024    8:44 AM  CMP  Glucose 70 - 99 mg/dL 878   BUN 8 - 23 mg/dL 16   Creatinine 9.55 - 1.00 mg/dL 9.19   Sodium 864 - 854 mmol/L 138   Potassium 3.5 - 5.1 mmol/L 3.7   Chloride 98 - 111 mmol/L 103   CO2 22 - 32 mmol/L 23   Calcium 8.9 - 10.3 mg/dL 9.4   Total Protein 6.5 - 8.1 g/dL 6.7   Total Bilirubin 0.0 - 1.2 mg/dL 0.8   Alkaline Phos 38 - 126 U/L 59   AST 15 - 41 U/L 36   ALT 0 - 44 U/L 18       Latest Ref Rng & Units 09/19/2024    8:44 AM  CBC  WBC 4.0 - 10.5 K/uL 7.0   Hemoglobin 12.0 - 15.0 g/dL 88.3   Hematocrit 63.9 - 46.0 % 35.4   Platelets 150 - 400 K/uL 280    I have personally reviewed Radiology images listed below: No images are attached to the encounter.  US  LIMITED ULTRASOUND INCLUDING AXILLA LEFT BREAST  Result Date: 09/04/2024 CLINICAL DATA:  Patient diagnosed in August 2025 with invasive ductal carcinoma at 2 sites and extensive surrounding calcifications; subsequent MRI showed the 2 dominant masses with intervening non mass enhancement, spanning a total of 9 cm in the LEFT breast. Status post neoadjuvant chemotherapy and presents today for ultrasound recheck. EXAM: ULTRASOUND OF THE LEFT BREAST COMPARISON:  Prior exam FINDINGS: Targeted ultrasound is performed, showing significant interval decrease in size of the dominant mass at the 2 o'clock position 7 cm from the nipple, the exact margins of which are challenging to determine but which measures approximately 3.5 x 0.8 x 1.9 cm, previously 3.9 x 2.4 x 2.7  cm. A second mass at the 2 o'clock position 3 cm from the nipple measures approximately 1.6 x 0.6 x 1.0 cm, previously 2.3 x 0.9 x 1.0 cm. Microcalcifications throughout the upper-outer quadrant of the LEFT breast remain evident on ultrasound. No new areas of involvement are identified. IMPRESSION: Interval decrease in size of multifocal  malignancy throughout the upper-outer quadrant of the LEFT breast, with residual disease as above. No new areas of involvement identified. RECOMMENDATION: Continued oncologic care per clinical team. I have discussed the findings and recommendations with the patient. If applicable, a reminder letter will be sent to the patient regarding the next appointment. BI-RADS CATEGORY  6: Known biopsy-proven malignancy. Electronically Signed   By: Norleen Croak M.D.   On: 09/04/2024 14:53     Assessment and plan- Patient is a 73 y.o. 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  She is here for on treatment assessment prior to cycle 4 of neoadjuvant TCHP chemotherapy  Counts okay to proceed with cycle 4 of neoadjuvant TCHP chemotherapy today with Neulasta  support.  Ultrasound of the left breast to assess response to treatment was completed on 09/04/24.  It shows good response to treatment with an interval decrease in size of multifocal malignancy throughout the upper-outer quadrant of the LEFT breast.    Proceed with cycle 4 of neoadjuvant TCHP chemotherapy on day 1 and will receive Neulasta  on day 2 as we are closed on day 3 for Thanksgiving.  Dr. Melanee to see her back in 6 weeks for cycle 5.  Planning to get echocardiogram around 10/02/2024  Plan is to complete 6 cycles of neoadjuvant TCHP chemotherapy followed by consideration for surgery  Hypokalemia: stable K 3.7 today continue potassium supplement    Follow up plan: Proceed with cycle 4 today F/U as scheduled LP   Morna Husband AGNP-C [facility] [phone] 09/19/2024 10:10 AM

## 2024-09-19 ENCOUNTER — Inpatient Hospital Stay: Admitting: Nurse Practitioner

## 2024-09-19 ENCOUNTER — Encounter: Payer: Self-pay | Admitting: Nurse Practitioner

## 2024-09-19 ENCOUNTER — Inpatient Hospital Stay

## 2024-09-19 VITALS — BP 157/82 | HR 98 | Resp 18

## 2024-09-19 VITALS — BP 152/92 | HR 103 | Temp 97.8°F | Resp 16 | Ht 64.0 in | Wt 162.4 lb

## 2024-09-19 DIAGNOSIS — Z171 Estrogen receptor negative status [ER-]: Secondary | ICD-10-CM | POA: Diagnosis not present

## 2024-09-19 DIAGNOSIS — C50412 Malignant neoplasm of upper-outer quadrant of left female breast: Secondary | ICD-10-CM | POA: Diagnosis not present

## 2024-09-19 DIAGNOSIS — Z5111 Encounter for antineoplastic chemotherapy: Secondary | ICD-10-CM | POA: Diagnosis not present

## 2024-09-19 LAB — CMP (CANCER CENTER ONLY)
ALT: 18 U/L (ref 0–44)
AST: 36 U/L (ref 15–41)
Albumin: 4 g/dL (ref 3.5–5.0)
Alkaline Phosphatase: 59 U/L (ref 38–126)
Anion gap: 12 (ref 5–15)
BUN: 16 mg/dL (ref 8–23)
CO2: 23 mmol/L (ref 22–32)
Calcium: 9.4 mg/dL (ref 8.9–10.3)
Chloride: 103 mmol/L (ref 98–111)
Creatinine: 0.8 mg/dL (ref 0.44–1.00)
GFR, Estimated: >60 mL/min (ref >60)
Glucose, Bld: 121 mg/dL — ABNORMAL HIGH (ref 70–99)
Potassium: 3.7 mmol/L (ref 3.5–5.1)
Sodium: 138 mmol/L (ref 135–145)
Total Bilirubin: 0.8 mg/dL (ref 0.0–1.2)
Total Protein: 6.7 g/dL (ref 6.5–8.1)

## 2024-09-19 LAB — CBC WITH DIFFERENTIAL (CANCER CENTER ONLY)
Abs Immature Granulocytes: 0.06 K/uL (ref 0.00–0.07)
Basophils Absolute: 0 K/uL (ref 0.0–0.1)
Basophils Relative: 0 %
Eosinophils Absolute: 0 K/uL (ref 0.0–0.5)
Eosinophils Relative: 0 %
HCT: 35.4 % — ABNORMAL LOW (ref 36.0–46.0)
Hemoglobin: 11.6 g/dL — ABNORMAL LOW (ref 12.0–15.0)
Immature Granulocytes: 1 %
Lymphocytes Relative: 22 %
Lymphs Abs: 1.6 K/uL (ref 0.7–4.0)
MCH: 27.2 pg (ref 26.0–34.0)
MCHC: 32.8 g/dL (ref 30.0–36.0)
MCV: 82.9 fL (ref 80.0–100.0)
Monocytes Absolute: 0.4 K/uL (ref 0.1–1.0)
Monocytes Relative: 5 %
Neutro Abs: 5.1 K/uL (ref 1.7–7.7)
Neutrophils Relative %: 72 %
Platelet Count: 280 K/uL (ref 150–400)
RBC: 4.27 MIL/uL (ref 3.87–5.11)
RDW: 17.7 % — ABNORMAL HIGH (ref 11.5–15.5)
WBC Count: 7 K/uL (ref 4.0–10.5)
nRBC: 0 % (ref 0.0–0.2)

## 2024-09-19 MED ORDER — DEXAMETHASONE SOD PHOSPHATE PF 10 MG/ML IJ SOLN
10.0000 mg | Freq: Once | INTRAMUSCULAR | Status: AC
  Administered 2024-09-19: 10 mg via INTRAVENOUS

## 2024-09-19 MED ORDER — ACETAMINOPHEN 325 MG PO TABS
650.0000 mg | ORAL_TABLET | Freq: Once | ORAL | Status: AC
  Administered 2024-09-19: 650 mg via ORAL
  Filled 2024-09-19: qty 2

## 2024-09-19 MED ORDER — PALONOSETRON HCL INJECTION 0.25 MG/5ML
0.2500 mg | Freq: Once | INTRAVENOUS | Status: AC
  Administered 2024-09-19: 0.25 mg via INTRAVENOUS
  Filled 2024-09-19: qty 5

## 2024-09-19 MED ORDER — SODIUM CHLORIDE 0.9 % IV SOLN
INTRAVENOUS | Status: DC
  Filled 2024-09-19: qty 250

## 2024-09-19 MED ORDER — PERTUZ-TRASTUZ-HYALURON-ZZXF 60-60-2000 MG-MG-U/ML CHEMO ~~LOC~~ SOLN
600.0000 mg | Freq: Once | SUBCUTANEOUS | Status: AC
  Administered 2024-09-19: 600 mg via SUBCUTANEOUS
  Filled 2024-09-19: qty 10

## 2024-09-19 MED ORDER — SODIUM CHLORIDE 0.9 % IV SOLN
432.5000 mg | Freq: Once | INTRAVENOUS | Status: AC
  Administered 2024-09-19: 430 mg via INTRAVENOUS
  Filled 2024-09-19: qty 43

## 2024-09-19 MED ORDER — SODIUM CHLORIDE 0.9 % IV SOLN
75.0000 mg/m2 | Freq: Once | INTRAVENOUS | Status: AC
  Administered 2024-09-19: 139 mg via INTRAVENOUS
  Filled 2024-09-19: qty 13.9

## 2024-09-19 MED ORDER — APREPITANT 130 MG/18ML IV EMUL
130.0000 mg | Freq: Once | INTRAVENOUS | Status: AC
  Administered 2024-09-19: 130 mg via INTRAVENOUS
  Filled 2024-09-19: qty 18

## 2024-09-19 MED ORDER — DIPHENHYDRAMINE HCL 25 MG PO TABS
50.0000 mg | ORAL_TABLET | Freq: Once | ORAL | Status: AC
  Administered 2024-09-19: 50 mg via ORAL
  Filled 2024-09-19: qty 2

## 2024-09-19 NOTE — Progress Notes (Signed)
 No concerns today

## 2024-09-20 ENCOUNTER — Inpatient Hospital Stay

## 2024-09-20 DIAGNOSIS — Z5111 Encounter for antineoplastic chemotherapy: Secondary | ICD-10-CM | POA: Diagnosis not present

## 2024-09-20 DIAGNOSIS — C50412 Malignant neoplasm of upper-outer quadrant of left female breast: Secondary | ICD-10-CM

## 2024-09-20 MED ORDER — PEGFILGRASTIM-JMDB 6 MG/0.6ML ~~LOC~~ SOSY
6.0000 mg | PREFILLED_SYRINGE | Freq: Once | SUBCUTANEOUS | Status: AC
  Administered 2024-09-20: 6 mg via SUBCUTANEOUS
  Filled 2024-09-20: qty 0.6

## 2024-09-22 ENCOUNTER — Ambulatory Visit
Admission: RE | Admit: 2024-09-22 | Discharge: 2024-09-22 | Disposition: A | Discharge status: Home / Self Care | Source: Ambulatory Visit | Attending: Oncology | Admitting: Oncology

## 2024-09-22 DIAGNOSIS — Z79899 Other long term (current) drug therapy: Secondary | ICD-10-CM | POA: Diagnosis not present

## 2024-09-22 DIAGNOSIS — E119 Type 2 diabetes mellitus without complications: Secondary | ICD-10-CM | POA: Diagnosis not present

## 2024-09-22 DIAGNOSIS — E785 Hyperlipidemia, unspecified: Secondary | ICD-10-CM | POA: Insufficient documentation

## 2024-09-22 DIAGNOSIS — Z171 Estrogen receptor negative status [ER-]: Secondary | ICD-10-CM | POA: Diagnosis present

## 2024-09-22 DIAGNOSIS — C50412 Malignant neoplasm of upper-outer quadrant of left female breast: Secondary | ICD-10-CM | POA: Diagnosis present

## 2024-09-22 LAB — ECHOCARDIOGRAM COMPLETE
AR max vel: 3.38 cm2
AV Area VTI: 4.34 cm2
AV Area mean vel: 3.77 cm2
AV Mean grad: 2 mmHg
AV Peak grad: 4.4 mmHg
Ao pk vel: 1.05 m/s
Area-P 1/2: 6.96 cm2
MV VTI: 4.06 cm2
S' Lateral: 2.4 cm

## 2024-09-22 NOTE — Progress Notes (Signed)
*  PRELIMINARY RESULTS* Echocardiogram 2D Echocardiogram has been performed.  Floydene Harder 09/22/2024, 11:44 AM

## 2024-09-27 ENCOUNTER — Telehealth: Payer: Self-pay

## 2024-09-27 DIAGNOSIS — Z171 Estrogen receptor negative status [ER-]: Secondary | ICD-10-CM

## 2024-09-27 MED ORDER — LIDOCAINE-PRILOCAINE 2.5-2.5 % EX CREA: TOPICAL_CREAM | CUTANEOUS | 3 refills | Status: AC

## 2024-09-27 NOTE — Telephone Encounter (Signed)
 Smc this week with labs cbc with diff and cmp

## 2024-09-27 NOTE — Telephone Encounter (Signed)
 Msg sent to scheduling to add pt to add pt for port lab/ smc- Josh and iv fluids

## 2024-09-27 NOTE — Addendum Note (Signed)
 Addended by: BULA POWELL PARAS on: 09/27/2024 03:50 PM   Modules accepted: Orders

## 2024-09-27 NOTE — Telephone Encounter (Signed)
 Pt needs emla  cream refilled as well. Rx sent to [facility] [address]

## 2024-09-27 NOTE — Telephone Encounter (Signed)
 Please advise:  Patient's daughter, [name], calling concerned that Ms. Parkey not feeling well since tx/GCSF on 09/23/24.  The tx/GCSF does make patient feel fatigued with decreased appetite.  Vitals were checked with HR 115, 112,112 BP 132/84 temp 97.6.  Patient reports that she does drink fluids but Trenetta can't guarantee how much.  Ms. Gravette does tell her that she doesn't want to drink to much due to increased urination keeping her up at night.

## 2024-09-28 ENCOUNTER — Inpatient Hospital Stay: Attending: Oncology | Admitting: Hospice and Palliative Medicine

## 2024-09-28 ENCOUNTER — Inpatient Hospital Stay: Attending: Oncology

## 2024-09-28 ENCOUNTER — Inpatient Hospital Stay

## 2024-09-28 ENCOUNTER — Encounter: Payer: Self-pay | Admitting: Hospice and Palliative Medicine

## 2024-09-28 ENCOUNTER — Other Ambulatory Visit: Payer: Self-pay

## 2024-09-28 ENCOUNTER — Telehealth: Payer: Self-pay | Admitting: Hospice and Palliative Medicine

## 2024-09-28 VITALS — BP 135/84 | HR 98 | Resp 18

## 2024-09-28 VITALS — BP 120/75 | HR 124 | Temp 97.4°F | Resp 18

## 2024-09-28 DIAGNOSIS — Z5189 Encounter for other specified aftercare: Secondary | ICD-10-CM | POA: Diagnosis not present

## 2024-09-28 DIAGNOSIS — Z90710 Acquired absence of both cervix and uterus: Secondary | ICD-10-CM | POA: Insufficient documentation

## 2024-09-28 DIAGNOSIS — Z171 Estrogen receptor negative status [ER-]: Secondary | ICD-10-CM

## 2024-09-28 DIAGNOSIS — Z9221 Personal history of antineoplastic chemotherapy: Secondary | ICD-10-CM | POA: Insufficient documentation

## 2024-09-28 DIAGNOSIS — J310 Chronic rhinitis: Secondary | ICD-10-CM | POA: Insufficient documentation

## 2024-09-28 DIAGNOSIS — E876 Hypokalemia: Secondary | ICD-10-CM | POA: Insufficient documentation

## 2024-09-28 DIAGNOSIS — R531 Weakness: Secondary | ICD-10-CM | POA: Diagnosis not present

## 2024-09-28 DIAGNOSIS — Z5111 Encounter for antineoplastic chemotherapy: Secondary | ICD-10-CM | POA: Diagnosis present

## 2024-09-28 DIAGNOSIS — Z1722 Progesterone receptor negative status: Secondary | ICD-10-CM | POA: Insufficient documentation

## 2024-09-28 DIAGNOSIS — Z1731 Human epidermal growth factor receptor 2 positive status: Secondary | ICD-10-CM | POA: Insufficient documentation

## 2024-09-28 DIAGNOSIS — Z515 Encounter for palliative care: Secondary | ICD-10-CM

## 2024-09-28 DIAGNOSIS — R000 Tachycardia, unspecified: Secondary | ICD-10-CM | POA: Insufficient documentation

## 2024-09-28 DIAGNOSIS — C50412 Malignant neoplasm of upper-outer quadrant of left female breast: Secondary | ICD-10-CM | POA: Diagnosis not present

## 2024-09-28 DIAGNOSIS — E860 Dehydration: Secondary | ICD-10-CM

## 2024-09-28 LAB — CBC WITH DIFFERENTIAL (CANCER CENTER ONLY)
Abs Immature Granulocytes: 2.05 K/uL — ABNORMAL HIGH (ref 0.00–0.07)
Basophils Absolute: 0 K/uL (ref 0.0–0.1)
Basophils Relative: 0 %
Eosinophils Absolute: 0 K/uL (ref 0.0–0.5)
Eosinophils Relative: 0 %
HCT: 35.3 % — ABNORMAL LOW (ref 36.0–46.0)
Hemoglobin: 11.6 g/dL — ABNORMAL LOW (ref 12.0–15.0)
Immature Granulocytes: 9 %
Lymphocytes Relative: 17 %
Lymphs Abs: 3.9 K/uL (ref 0.7–4.0)
MCH: 27.8 pg (ref 26.0–34.0)
MCHC: 32.9 g/dL (ref 30.0–36.0)
MCV: 84.4 fL (ref 80.0–100.0)
Monocytes Absolute: 1.8 K/uL — ABNORMAL HIGH (ref 0.1–1.0)
Monocytes Relative: 8 %
Neutro Abs: 15.2 K/uL — ABNORMAL HIGH (ref 1.7–7.7)
Neutrophils Relative %: 66 %
Platelet Count: 228 K/uL (ref 150–400)
RBC: 4.18 MIL/uL (ref 3.87–5.11)
RDW: 17.5 % — ABNORMAL HIGH (ref 11.5–15.5)
Smear Review: NORMAL
WBC Count: 22.9 K/uL — ABNORMAL HIGH (ref 4.0–10.5)
nRBC: 0.4 % — ABNORMAL HIGH (ref 0.0–0.2)

## 2024-09-28 LAB — CMP (CANCER CENTER ONLY)
ALT: 18 U/L (ref 0–44)
AST: 34 U/L (ref 15–41)
Albumin: 4.1 g/dL (ref 3.5–5.0)
Alkaline Phosphatase: 108 U/L (ref 38–126)
Anion gap: 13 (ref 5–15)
BUN: 8 mg/dL (ref 8–23)
CO2: 25 mmol/L (ref 22–32)
Calcium: 9.5 mg/dL (ref 8.9–10.3)
Chloride: 102 mmol/L (ref 98–111)
Creatinine: 0.77 mg/dL (ref 0.44–1.00)
GFR, Estimated: >60 mL/min (ref >60)
Glucose, Bld: 138 mg/dL — ABNORMAL HIGH (ref 70–99)
Potassium: 3.2 mmol/L — ABNORMAL LOW (ref 3.5–5.1)
Sodium: 141 mmol/L (ref 135–145)
Total Bilirubin: 0.4 mg/dL (ref 0.0–1.2)
Total Protein: 6.2 g/dL — ABNORMAL LOW (ref 6.5–8.1)

## 2024-09-28 LAB — MAGNESIUM: Magnesium: 1.6 mg/dL — ABNORMAL LOW (ref 1.7–2.4)

## 2024-09-28 MED ORDER — POTASSIUM CHLORIDE 10 MEQ/100ML IV SOLN
10.0000 meq | Freq: Once | INTRAVENOUS | Status: AC
  Administered 2024-09-28: 10 meq via INTRAVENOUS
  Filled 2024-09-28: qty 100

## 2024-09-28 MED ORDER — MAGNESIUM SULFATE 2 GM/50ML IV SOLN
2.0000 g | Freq: Once | INTRAVENOUS | Status: AC
  Administered 2024-09-28: 2 g via INTRAVENOUS
  Filled 2024-09-28: qty 50

## 2024-09-28 MED ORDER — SODIUM CHLORIDE 0.9 % IV SOLN
INTRAVENOUS | Status: DC
  Filled 2024-09-28 (×2): qty 250

## 2024-09-28 MED ORDER — POTASSIUM CHLORIDE CRYS ER 10 MEQ PO TBCR: 10.0000 meq | EXTENDED_RELEASE_TABLET | Freq: Every day | ORAL | 0 refills | Status: AC

## 2024-09-28 NOTE — Telephone Encounter (Signed)
 Wrap up was put in after pt left. I called pt daughter and confirmed next weeks appts.

## 2024-09-28 NOTE — Progress Notes (Signed)
 Symptom Management Clinic [facility] at [facility] [phone] [phone]  Patient Care Team: Diedra Lame, MD as PCP - General (Family Medicine) Georgina Shasta POUR, RN as Oncology Nurse Navigator Melanee Annah BROCKS, MD as Consulting Physician (Oncology)   NAME OF PATIENTGeralynn [name]  [id number]  [birth year]   DATE OF VISIT: 09/28/24  REASON FOR CONSULT: Teresa [name] is a 73 y.o. 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   INTERVAL HISTORY: Patient received cycle 4 TCHP chemotherapy on 09/19/2024.  Had G-CSF on 09/20/2024.  She presents to [facility] today for follow-up labs and supportive care.  Per patient and daughter, patient always feels weak and does poorly after she receives G-CSF.  Patient endorses poor oral intake.  Reports that she is drinking minimally.  Was working with PT at home yesterday and was more weak.  PT told her she had tachycardia.  Patient denies fever or chills.  No shortness of breath or chest pain.  Denies nausea, vomiting, diarrhea or constipation.  No urinary symptoms. Denies any neurologic complaints. Denies any easy bleeding or bruising. Patient offers no further specific complaints today.   PAST MEDICAL HISTORY: Past Medical History:  Diagnosis Date   Acute encephalopathy 05/13/2024   Allergic rhinitis    Anxiety    B12 deficiency 05/13/2024   Cystitis, acute 04/02/2014   Depression    Diabetes mellitus without complication (HCC)    GERD (gastroesophageal reflux disease)    Head injury    History of falling 10/06/2012   Hyperlipidemia    Lumbar radiculopathy 02/20/2015   Malignant neoplasm of upper-outer quadrant of left breast in female, estrogen receptor negative (HCC) 06/30/2024   SIRS (systemic inflammatory response syndrome) (HCC) 05/13/2024   Status post lumbar laminectomy 10/06/2012    PAST SURGICAL HISTORY:  Past Surgical  History:  Procedure Laterality Date   ABDOMINAL HYSTERECTOMY     BACK SURGERY     2000   BREAST BIOPSY Left 06/20/2024   u/s bx x2 clip ribbon and coil   BREAST BIOPSY Left 06/20/2024   US  LT BREAST BX W LOC DEV EA ADD LESION IMG BX SPEC US  GUIDE 06/20/2024 [facility]   BREAST BIOPSY Left 06/20/2024   US  LT BREAST BX W LOC DEV 1ST LESION IMG BX SPEC US  GUIDE 06/20/2024 [facility]   PORTACATH PLACEMENT Left 07/07/2024   Procedure: INSERTION, TUNNELED CENTRAL VENOUS DEVICE, WITH PORT;  Surgeon: Tye Millet, DO;  Location: [facility] ORS;  Service: General;  Laterality: Left;   TUBAL LIGATION      HEMATOLOGY/ONCOLOGY HISTORY:  Oncology History  Malignant neoplasm of upper-outer quadrant of left breast in female, estrogen receptor negative (HCC)  06/30/2024 Initial Diagnosis   Malignant neoplasm of upper-outer quadrant of left breast in female, estrogen receptor negative (HCC)   06/30/2024 Cancer Staging   Staging form: Breast, AJCC 8th Edition - Clinical stage from 06/30/2024: Stage IIB (cT3, cN0, cM0, G3, ER-, PR-, HER2+) - Signed by Melanee Annah BROCKS, MD on 06/30/2024 Stage prefix: Initial diagnosis Histologic grading system: 3 grade system   07/18/2024 -  Chemotherapy   Patient is on Treatment Plan : BREAST  Docetaxel  + Carboplatin  + Trastuzumab + Pertuzumab   (TCHP) q21d        ALLERGIES:  is allergic to aleve [naproxen sodium] and aspirin.  MEDICATIONS:  Current Outpatient Medications  Medication Sig Dispense Refill   acetaminophen  (TYLENOL ) 500 MG tablet Take 1 tablet (500 mg total)  by mouth every 4 (four) hours as needed. 120 tablet 1   atenolol  (TENORMIN ) 25 MG tablet Take 25 mg by mouth daily. (Patient not taking: Reported on 09/19/2024)     cetirizine  (ZYRTEC ) 10 MG tablet Take 1 tablet (10 mg total) by mouth daily. (Patient taking differently: Take 10 mg by mouth daily as needed for allergies.) 30 tablet 3   dexamethasone  (DECADRON ) 4 MG tablet Take 2 tabs by mouth 2 times daily  starting day before chemo. Then take 2 tabs daily for 2 days starting day after chemo. Take with food. 30 tablet 1   lidocaine -prilocaine  (EMLA ) cream Apply to affected area once 30 g 3   LORazepam  (ATIVAN ) 0.5 MG tablet Take 1 tablet (0.5 mg total) by mouth every 8 (eight) hours as needed for anxiety. 30 tablet 0   Multiple Vitamin (MULTIVITAMIN WITH MINERALS) TABS tablet Take 1 tablet by mouth daily.     ondansetron  (ZOFRAN ) 8 MG tablet Take 1 tablet (8 mg total) by mouth every 8 (eight) hours as needed for nausea or vomiting. Start on the third day after chemotherapy. 30 tablet 1   potassium chloride  (KLOR-CON  M) 10 MEQ tablet Take 1 tablet (10 mEq total) by mouth daily. 14 tablet 0   prochlorperazine  (COMPAZINE ) 10 MG tablet Take 1 tablet (10 mg total) by mouth every 6 (six) hours as needed for nausea or vomiting. 30 tablet 1   traMADol  (ULTRAM ) 50 MG tablet Take 1 tablet (50 mg total) by mouth every 8 (eight) hours as needed. 6 tablet 0   venlafaxine  XR (EFFEXOR -XR) 75 MG 24 hr capsule Take 75 mg by mouth daily.     No current facility-administered medications for this visit.   Facility-Administered Medications Ordered in Other Visits  Medication Dose Route Frequency Provider Last Rate Last Admin   0.9 %  sodium chloride  infusion   Intravenous Continuous Melanee Annah BROCKS, MD   Stopped at 08/29/24 1315    VITAL SIGNS: There were no vitals taken for this visit. There were no vitals filed for this visit.   Estimated body mass index is 27.88 kg/m as calculated from the following:   Height as of 09/19/24: 5' 4 (1.626 m).   Weight as of 09/19/24: 162 lb 6.4 oz (73.7 kg).  LABS: CBC:    Component Value Date/Time   WBC 7.0 09/19/2024 0844   WBC 14.8 (H) 08/13/2024 0438   HGB 11.6 (L) 09/19/2024 0844   HCT 35.4 (L) 09/19/2024 0844   PLT 280 09/19/2024 0844   MCV 82.9 09/19/2024 0844   NEUTROABS 5.1 09/19/2024 0844   LYMPHSABS 1.6 09/19/2024 0844   MONOABS 0.4 09/19/2024 0844    EOSABS 0.0 09/19/2024 0844   BASOSABS 0.0 09/19/2024 0844   Comprehensive Metabolic Panel:    Component Value Date/Time   NA 138 09/19/2024 0844   K 3.7 09/19/2024 0844   CL 103 09/19/2024 0844   CO2 23 09/19/2024 0844   BUN 16 09/19/2024 0844   CREATININE 0.80 09/19/2024 0844   CREATININE 0.69 02/20/2015 0926   GLUCOSE 121 (H) 09/19/2024 0844   CALCIUM 9.4 09/19/2024 0844   AST 36 09/19/2024 0844   ALT 18 09/19/2024 0844   ALKPHOS 59 09/19/2024 0844   BILITOT 0.8 09/19/2024 0844   PROT 6.7 09/19/2024 0844   ALBUMIN 4.0 09/19/2024 0844    RADIOGRAPHIC STUDIES: ECHOCARDIOGRAM COMPLETE Result Date: 09/22/2024    ECHOCARDIOGRAM REPORT   Patient Name:   MACHELE DEIHL Date of Exam: 09/22/2024 Medical  Rec #:  [id number]        Height:       64.0 in Accession #:    [id number]       Weight:       162.4 lb Date of Birth:  Sep 19, 1951        BSA:          1.791 m Patient Age:    73 years         BP:           157/82 mmHg Patient Gender: F                HR:           98 bpm. Exam Location:  [facility] Procedure: 2D Echo, 3D Echo, Cardiac Doppler, Color Doppler and Strain Analysis            (Both Spectral and Color Flow Doppler were utilized during            procedure). Indications:     Chemo Z09  History:         Patient has prior history of Echocardiogram examinations, most                  recent 07/03/2024. Risk Factors:Diabetes and Dyslipidemia.  Sonographer:     Christopher Furnace Referring Phys:  [id number] [facility] C RAO Diagnosing Phys: Redell Cave MD  Sonographer Comments: Technically challenging study due to limited acoustic windows and suboptimal apical window. Global longitudinal strain was attempted. IMPRESSIONS  1. Left ventricular ejection fraction, by estimation, is 55 to 60%. The left ventricle has normal function. The left ventricle has no regional wall motion abnormalities. There is mild left ventricular hypertrophy. Left ventricular diastolic parameters are consistent with Grade I diastolic  dysfunction (impaired relaxation).  2. Right ventricular systolic function is normal. The right ventricular size is normal.  3. The mitral valve is normal in structure. No evidence of mitral valve regurgitation.  4. The aortic valve is tricuspid. Aortic valve regurgitation is not visualized. FINDINGS  Left Ventricle: Left ventricular ejection fraction, by estimation, is 55 to 60%. The left ventricle has normal function. The left ventricle has no regional wall motion abnormalities. Global longitudinal strain performed but not reported based on interpreter judgement due to suboptimal tracking. The left ventricular internal cavity size was normal in size. There is mild left ventricular hypertrophy. Left ventricular diastolic parameters are consistent with Grade I diastolic dysfunction (impaired relaxation). Right Ventricle: The right ventricular size is normal. No increase in right ventricular wall thickness. Right ventricular systolic function is normal. Left Atrium: Left atrial size was normal in size. Right Atrium: Right atrial size was normal in size. Pericardium: There is no evidence of pericardial effusion. Mitral Valve: The mitral valve is normal in structure. No evidence of mitral valve regurgitation. MV peak gradient, 6.2 mmHg. The mean mitral valve gradient is 3.0 mmHg. Tricuspid Valve: The tricuspid valve is normal in structure. Tricuspid valve regurgitation is not demonstrated. Aortic Valve: The aortic valve is tricuspid. Aortic valve regurgitation is not visualized. Aortic valve mean gradient measures 2.0 mmHg. Aortic valve peak gradient measures 4.4 mmHg. Aortic valve area, by VTI measures 4.34 cm. Pulmonic Valve: The pulmonic valve was not well visualized. Pulmonic valve regurgitation is not visualized. Aorta: The aortic root is normal in size and structure. Venous: The inferior vena cava was not well visualized. IAS/Shunts: No atrial level shunt detected by color flow Doppler.  LEFT VENTRICLE PLAX 2D  LVIDd:  3.30 cm   Diastology LVIDs:         2.40 cm   LV e' medial:    7.94 cm/s LV PW:         0.90 cm   LV E/e' medial:  6.4 LV IVS:        1.30 cm   LV e' lateral:   10.60 cm/s LVOT diam:     2.00 cm   LV E/e' lateral: 4.8 LV SV:         57 LV SV Index:   32 LVOT Area:     3.14 cm LV IVRT:       87 msec  RIGHT VENTRICLE RV Basal diam:  2.30 cm     PULMONARY VEINS RV Mid diam:    2.20 cm     Diastolic Velocity: 67.60 cm/s RV S prime:     19.00 cm/s  S/D Velocity:       0.80 TAPSE (M-mode): 1.7 cm      Systolic Velocity:  55.50 cm/s LEFT ATRIUM           Index       RIGHT ATRIUM           Index LA diam:      2.20 cm 1.23 cm/m  RA Area:     13.90 cm LA Vol (A2C): 8.7 ml  4.84 ml/m  RA Volume:   34.10 ml  19.04 ml/m LA Vol (A4C): 10.8 ml 6.03 ml/m  AORTIC VALVE AV Area (Vmax):    3.38 cm AV Area (Vmean):   3.77 cm AV Area (VTI):     4.34 cm AV Vmax:           105.00 cm/s AV Vmean:          71.200 cm/s AV VTI:            0.131 m AV Peak Grad:      4.4 mmHg AV Mean Grad:      2.0 mmHg LVOT Vmax:         113.00 cm/s LVOT Vmean:        85.500 cm/s LVOT VTI:          0.181 m LVOT/AV VTI ratio: 1.38  AORTA Ao Root diam: 3.10 cm MITRAL VALVE               TRICUSPID VALVE MV Area (PHT): 6.96 cm    TR Peak grad:   9.2 mmHg MV Area VTI:   4.06 cm    TR Vmax:        152.00 cm/s MV Peak grad:  6.2 mmHg MV Mean grad:  3.0 mmHg    SHUNTS MV Vmax:       1.24 m/s    Systemic VTI:  0.18 m MV Vmean:      72.7 cm/s   Systemic Diam: 2.00 cm MV Decel Time: 109 msec MV E velocity: 50.80 cm/s MV A velocity: 87.50 cm/s MV E/A ratio:  0.58 Redell Cave MD Electronically signed by Redell Cave MD Signature Date/Time: 09/22/2024/2:18:44 PM    Final    US  LIMITED ULTRASOUND INCLUDING AXILLA LEFT BREAST  Result Date: 09/04/2024 CLINICAL DATA:  Patient diagnosed in August 2025 with invasive ductal carcinoma at 2 sites and extensive surrounding calcifications; subsequent MRI showed the 2 dominant masses with  intervening non mass enhancement, spanning a total of 9 cm in the LEFT breast. Status post neoadjuvant chemotherapy and presents today for ultrasound recheck. EXAM: ULTRASOUND OF  THE LEFT BREAST COMPARISON:  Prior exam FINDINGS: Targeted ultrasound is performed, showing significant interval decrease in size of the dominant mass at the 2 o'clock position 7 cm from the nipple, the exact margins of which are challenging to determine but which measures approximately 3.5 x 0.8 x 1.9 cm, previously 3.9 x 2.4 x 2.7 cm. A second mass at the 2 o'clock position 3 cm from the nipple measures approximately 1.6 x 0.6 x 1.0 cm, previously 2.3 x 0.9 x 1.0 cm. Microcalcifications throughout the upper-outer quadrant of the LEFT breast remain evident on ultrasound. No new areas of involvement are identified. IMPRESSION: Interval decrease in size of multifocal malignancy throughout the upper-outer quadrant of the LEFT breast, with residual disease as above. No new areas of involvement identified. RECOMMENDATION: Continued oncologic care per clinical team. I have discussed the findings and recommendations with the patient. If applicable, a reminder letter will be sent to the patient regarding the next appointment. BI-RADS CATEGORY  6: Known biopsy-proven malignancy. Electronically Signed   By: Norleen Croak M.D.   On: 09/04/2024 14:53    PERFORMANCE STATUS (ECOG) : 2 - Symptomatic, <50% confined to bed  Review of Systems Unless otherwise noted, a complete review of systems is negative.  Physical Exam General: NAD, nontoxic appearing Cardiovascular: regular rhythm, tachycardic Pulmonary: clear anterior/posterior fields Abdomen: soft, nontender, + bowel sounds GU: no suprapubic tenderness Extremities: no edema, no joint deformities Skin: no rashes Neurological: Weakness but otherwise nonfocal  IMPRESSION/PLAN: Breast cancer -on TCHP chemotherapy.    Weakness -daughter reports that patient declines each time she gets  G-CSF.  Suspect patient is dehydrated given report of minimal oral intake.  Patient is nontoxic-appearing.  Denies any symptom other than poor oral intake and weakness.  She did have mild tachycardia but no hypoxia and vitals otherwise stable.  Labs grossly unchanged from baseline.  Grade 1 diastolic dysfunction noted on recent echo but no signs of heart failure.  Will proceed with IV fluids and supportive care. ED triggers reviewed in detail.   Hypokalemia/hypomagnesemia-likely due to poor oral intake.  Will replete with IV K and mag today and will restart potassium supplement.  Labs/fluid next week. MD follow-up in 2 weeks.  Patient to be seen sooner if needed.  Addendum: HR normalized with IVFs. Likely will benefit from ongoing supportive care and perhaps scheduled IVFs post chemotherapy  Patient expressed understanding and was in agreement with this plan. She also understands that She can call clinic at any time with any questions, concerns, or complaints.   Thank you for allowing me to participate in the care of this very pleasant patient.   Time Total: 25 minutes  Visit consisted of counseling and education dealing with the complex and emotionally intense issues of symptom management in the setting of serious illness.Greater than 50%  of this time was spent counseling and coordinating care related to the above assessment and plan.  Signed by: Fonda Mower, PhD, NP-C

## 2024-09-28 NOTE — Progress Notes (Signed)
 Pt in to see [facility] provider for increased fatigue,body aches, decreased appetite and very concerned about spots on palm of hands'.

## 2024-10-02 ENCOUNTER — Telehealth: Payer: Self-pay | Admitting: *Deleted

## 2024-10-02 NOTE — Telephone Encounter (Signed)
 Physical therapist assistant called to report patient HR is consistently 112-121 can is unable to work with patient unless provider approves.  Therapist states he called pt PCP but they stated they would not sign off unless it was approved by Dr Melanee.

## 2024-10-02 NOTE — Telephone Encounter (Signed)
 Returned call to PTA Madeleine [place] and stated Dr Melanee was ok wtih patient doing PT with HR 112-121 and we could send a letter.  PTA stated he could take a verbal order for therapy being ok with elevated HR from Dr Melanee and send a note to the referring provider for sign off.  RN verbalized understanding.

## 2024-10-02 NOTE — Telephone Encounter (Signed)
 Ok to send a letter stating ok to do PT

## 2024-10-03 ENCOUNTER — Other Ambulatory Visit: Payer: Self-pay

## 2024-10-03 ENCOUNTER — Inpatient Hospital Stay

## 2024-10-03 DIAGNOSIS — Z5111 Encounter for antineoplastic chemotherapy: Secondary | ICD-10-CM | POA: Diagnosis not present

## 2024-10-03 DIAGNOSIS — Z171 Estrogen receptor negative status [ER-]: Secondary | ICD-10-CM

## 2024-10-03 LAB — CMP (CANCER CENTER ONLY)
ALT: 19 U/L (ref 0–44)
AST: 29 U/L (ref 15–41)
Albumin: 3.9 g/dL (ref 3.5–5.0)
Alkaline Phosphatase: 92 U/L (ref 38–126)
Anion gap: 13 (ref 5–15)
BUN: 14 mg/dL (ref 8–23)
CO2: 25 mmol/L (ref 22–32)
Calcium: 9.1 mg/dL (ref 8.9–10.3)
Chloride: 104 mmol/L (ref 98–111)
Creatinine: 0.75 mg/dL (ref 0.44–1.00)
GFR, Estimated: >60 mL/min (ref >60)
Glucose, Bld: 134 mg/dL — ABNORMAL HIGH (ref 70–99)
Potassium: 3.5 mmol/L (ref 3.5–5.1)
Sodium: 141 mmol/L (ref 135–145)
Total Bilirubin: 0.4 mg/dL (ref 0.0–1.2)
Total Protein: 5.7 g/dL — ABNORMAL LOW (ref 6.5–8.1)

## 2024-10-03 LAB — CBC WITH DIFFERENTIAL (CANCER CENTER ONLY)
Abs Immature Granulocytes: 0.26 K/uL — ABNORMAL HIGH (ref 0.00–0.07)
Basophils Absolute: 0.1 K/uL (ref 0.0–0.1)
Basophils Relative: 0 %
Eosinophils Absolute: 0 K/uL (ref 0.0–0.5)
Eosinophils Relative: 0 %
HCT: 36 % (ref 36.0–46.0)
Hemoglobin: 11.7 g/dL — ABNORMAL LOW (ref 12.0–15.0)
Immature Granulocytes: 2 %
Lymphocytes Relative: 22 %
Lymphs Abs: 2.8 K/uL (ref 0.7–4.0)
MCH: 27.4 pg (ref 26.0–34.0)
MCHC: 32.5 g/dL (ref 30.0–36.0)
MCV: 84.3 fL (ref 80.0–100.0)
Monocytes Absolute: 0.8 K/uL (ref 0.1–1.0)
Monocytes Relative: 7 %
Neutro Abs: 8.7 K/uL — ABNORMAL HIGH (ref 1.7–7.7)
Neutrophils Relative %: 69 %
Platelet Count: 181 K/uL (ref 150–400)
RBC: 4.27 MIL/uL (ref 3.87–5.11)
RDW: 17.8 % — ABNORMAL HIGH (ref 11.5–15.5)
WBC Count: 12.6 K/uL — ABNORMAL HIGH (ref 4.0–10.5)
nRBC: 0.2 % (ref 0.0–0.2)

## 2024-10-03 MED ORDER — FLUTICASONE PROPIONATE 50 MCG/ACT NA SUSP: 2.0000 | Freq: Every day | NASAL | 2 refills | Status: AC

## 2024-10-03 NOTE — Progress Notes (Signed)
No fluids needed today.

## 2024-10-10 ENCOUNTER — Inpatient Hospital Stay

## 2024-10-10 ENCOUNTER — Encounter: Payer: Self-pay | Admitting: Oncology

## 2024-10-10 ENCOUNTER — Inpatient Hospital Stay: Admitting: Oncology

## 2024-10-10 VITALS — BP 138/88 | HR 85 | Resp 18

## 2024-10-10 VITALS — BP 122/79 | HR 110 | Temp 97.2°F | Resp 18 | Ht 64.0 in | Wt 156.7 lb

## 2024-10-10 DIAGNOSIS — Z171 Estrogen receptor negative status [ER-]: Secondary | ICD-10-CM | POA: Diagnosis not present

## 2024-10-10 DIAGNOSIS — Z1722 Progesterone receptor negative status: Secondary | ICD-10-CM | POA: Diagnosis not present

## 2024-10-10 DIAGNOSIS — Z5111 Encounter for antineoplastic chemotherapy: Secondary | ICD-10-CM

## 2024-10-10 DIAGNOSIS — Z5112 Encounter for antineoplastic immunotherapy: Secondary | ICD-10-CM

## 2024-10-10 DIAGNOSIS — C50412 Malignant neoplasm of upper-outer quadrant of left female breast: Secondary | ICD-10-CM

## 2024-10-10 DIAGNOSIS — Z1731 Human epidermal growth factor receptor 2 positive status: Secondary | ICD-10-CM | POA: Diagnosis not present

## 2024-10-10 LAB — CBC WITH DIFFERENTIAL (CANCER CENTER ONLY)
Abs Immature Granulocytes: 0.04 K/uL (ref 0.00–0.07)
Basophils Absolute: 0 K/uL (ref 0.0–0.1)
Basophils Relative: 1 %
Eosinophils Absolute: 0 K/uL (ref 0.0–0.5)
Eosinophils Relative: 0 %
HCT: 35.7 % — ABNORMAL LOW (ref 36.0–46.0)
Hemoglobin: 11.5 g/dL — ABNORMAL LOW (ref 12.0–15.0)
Immature Granulocytes: 1 %
Lymphocytes Relative: 27 %
Lymphs Abs: 1.7 K/uL (ref 0.7–4.0)
MCH: 27.7 pg (ref 26.0–34.0)
MCHC: 32.2 g/dL (ref 30.0–36.0)
MCV: 86 fL (ref 80.0–100.0)
Monocytes Absolute: 0.7 K/uL (ref 0.1–1.0)
Monocytes Relative: 11 %
Neutro Abs: 4 K/uL (ref 1.7–7.7)
Neutrophils Relative %: 60 %
Platelet Count: 225 K/uL (ref 150–400)
RBC: 4.15 MIL/uL (ref 3.87–5.11)
RDW: 17.7 % — ABNORMAL HIGH (ref 11.5–15.5)
WBC Count: 6.5 K/uL (ref 4.0–10.5)
nRBC: 0 % (ref 0.0–0.2)

## 2024-10-10 LAB — CMP (CANCER CENTER ONLY)
ALT: 15 U/L (ref 0–44)
AST: 27 U/L (ref 15–41)
Albumin: 3.9 g/dL (ref 3.5–5.0)
Alkaline Phosphatase: 63 U/L (ref 38–126)
Anion gap: 12 (ref 5–15)
BUN: 11 mg/dL (ref 8–23)
CO2: 25 mmol/L (ref 22–32)
Calcium: 9.3 mg/dL (ref 8.9–10.3)
Chloride: 103 mmol/L (ref 98–111)
Creatinine: 0.74 mg/dL (ref 0.44–1.00)
GFR, Estimated: >60 mL/min (ref >60)
Glucose, Bld: 169 mg/dL — ABNORMAL HIGH (ref 70–99)
Potassium: 3.3 mmol/L — ABNORMAL LOW (ref 3.5–5.1)
Sodium: 140 mmol/L (ref 135–145)
Total Bilirubin: 0.6 mg/dL (ref 0.0–1.2)
Total Protein: 6 g/dL — ABNORMAL LOW (ref 6.5–8.1)

## 2024-10-10 MED ORDER — SODIUM CHLORIDE 0.9 % IV SOLN
INTRAVENOUS | Status: DC
  Filled 2024-10-10: qty 250

## 2024-10-10 MED ORDER — ACETAMINOPHEN 325 MG PO TABS
650.0000 mg | ORAL_TABLET | Freq: Once | ORAL | Status: AC
  Administered 2024-10-10: 10:00:00 650 mg via ORAL
  Filled 2024-10-10: qty 2

## 2024-10-10 MED ORDER — SODIUM CHLORIDE 0.9 % IV SOLN
75.0000 mg/m2 | Freq: Once | INTRAVENOUS | Status: AC
  Administered 2024-10-10: 12:00:00 139 mg via INTRAVENOUS
  Filled 2024-10-10: qty 13.9

## 2024-10-10 MED ORDER — DIPHENHYDRAMINE HCL 25 MG PO TABS
50.0000 mg | ORAL_TABLET | Freq: Once | ORAL | Status: AC
  Administered 2024-10-10: 10:00:00 50 mg via ORAL
  Filled 2024-10-10: qty 2

## 2024-10-10 MED ORDER — DEXAMETHASONE SOD PHOSPHATE PF 10 MG/ML IJ SOLN
10.0000 mg | Freq: Once | INTRAMUSCULAR | Status: AC
  Administered 2024-10-10: 11:00:00 10 mg via INTRAVENOUS

## 2024-10-10 MED ORDER — PERTUZ-TRASTUZ-HYALURON-ZZXF 60-60-2000 MG-MG-U/ML CHEMO ~~LOC~~ SOLN
600.0000 mg | Freq: Once | SUBCUTANEOUS | Status: AC
  Administered 2024-10-10: 11:00:00 600 mg via SUBCUTANEOUS
  Filled 2024-10-10: qty 10

## 2024-10-10 MED ORDER — AZELASTINE HCL 0.1 % NA SOLN: 2.0000 | Freq: Two times a day (BID) | NASAL | 5 refills | Status: AC

## 2024-10-10 MED ORDER — SODIUM CHLORIDE 0.9 % IV SOLN
432.5000 mg | Freq: Once | INTRAVENOUS | Status: AC
  Administered 2024-10-10: 13:00:00 430 mg via INTRAVENOUS
  Filled 2024-10-10: qty 43

## 2024-10-10 MED ORDER — APREPITANT 130 MG/18ML IV EMUL
130.0000 mg | Freq: Once | INTRAVENOUS | Status: AC
  Administered 2024-10-10: 11:00:00 130 mg via INTRAVENOUS
  Filled 2024-10-10: qty 18

## 2024-10-10 MED ORDER — PALONOSETRON HCL INJECTION 0.25 MG/5ML
0.2500 mg | Freq: Once | INTRAVENOUS | Status: AC
  Administered 2024-10-10: 11:00:00 0.25 mg via INTRAVENOUS
  Filled 2024-10-10: qty 5

## 2024-10-10 NOTE — Patient Instructions (Signed)
 [facility] - A DEPT OF [facility]  Discharge Instructions: Thank you for choosing [facility] to provide your oncology and hematology care.  If you have a lab appointment with the Cancer Center, please go directly to the Cancer Center and check in at the registration area.  Wear comfortable clothing and clothing appropriate for easy access to any Portacath or PICC line.   We strive to give you quality time with your provider. You may need to reschedule your appointment if you arrive late (15 or more minutes).  Arriving late affects you and other patients whose appointments are after yours.  Also, if you miss three or more appointments without notifying the office, you may be dismissed from the clinic at the providers discretion.      For prescription refill requests, have your pharmacy contact our office and allow 72 hours for refills to be completed.    Today you received the following chemotherapy and/or immunotherapy agents Phesgo , Taxotere , Carbolplatin      To help prevent nausea and vomiting after your treatment, we encourage you to take your nausea medication as directed.  BELOW ARE SYMPTOMS THAT SHOULD BE REPORTED IMMEDIATELY: *FEVER GREATER THAN 100.4 F (38 C) OR HIGHER *CHILLS OR SWEATING *NAUSEA AND VOMITING THAT IS NOT CONTROLLED WITH YOUR NAUSEA MEDICATION *UNUSUAL SHORTNESS OF BREATH *UNUSUAL BRUISING OR BLEEDING *URINARY PROBLEMS (pain or burning when urinating, or frequent urination) *BOWEL PROBLEMS (unusual diarrhea, constipation, pain near the anus) TENDERNESS IN MOUTH AND THROAT WITH OR WITHOUT PRESENCE OF ULCERS (sore throat, sores in mouth, or a toothache) UNUSUAL RASH, SWELLING OR PAIN  UNUSUAL VAGINAL DISCHARGE OR ITCHING   Items with * indicate a potential emergency and should be followed up as soon as possible or go to the Emergency Department if any problems should occur.  Please show the CHEMOTHERAPY ALERT  CARD or IMMUNOTHERAPY ALERT CARD at check-in to the Emergency Department and triage nurse.  Should you have questions after your visit or need to cancel or reschedule your appointment, please contact [facility] - A DEPT OF [facility]  [phone] and follow the prompts.  Office hours are 8:00 a.m. to 4:30 p.m. Monday - Friday. Please note that voicemails left after 4:00 p.m. may not be returned until the following business day.  We are closed weekends and major holidays. You have access to a nurse at all times for urgent questions. Please call the main number to the clinic [phone] and follow the prompts.  For any non-urgent questions, you may also contact your provider using MyChart. We now offer e-Visits for anyone 77 and older to request care online for non-urgent symptoms. For details visit [email or web address].   Also download the MyChart app! Go to the app store, search MyChart, open the app, select [place], and log in with your MyChart username and password.

## 2024-10-10 NOTE — Progress Notes (Signed)
 Hematology/Oncology Consult note [facility]  Telephone:([phone] [phone]  Patient Care Team: Diedra Lame, MD as PCP - General (Family Medicine) Georgina Shasta POUR, RN as Oncology Nurse Navigator Melanee Annah BROCKS, MD as Consulting Physician (Oncology)   Name of the patient: Teresa [name]  [id number]  1951/05/30   Date of visit: 10/10/2024  Diagnosis-  Cancer Staging  Malignant neoplasm of upper-outer quadrant of left breast in female, estrogen receptor negative (HCC) Staging form: Breast, AJCC 8th Edition - Clinical stage from 06/30/2024: Stage IIB (cT3, cN0, cM0, G3, ER-, PR-, HER2+) - Signed by Melanee Annah BROCKS, MD on 06/30/2024 Stage prefix: Initial diagnosis Histologic grading system: 3 grade system    Chief complaint/ Reason for visit-on treatment assessment prior to cycle 5 of neoadjuvant TCHP chemotherapy  Heme/Onc history: patient is a 73 year old female with a past medical history significant for chronic back pain status post lumbar laminectomy, type 2 diabetes who presented to the ER with symptoms of bodyaches and underwent CT chest abdomen and pelvis with contrast which incidentally showed left breast mass but no evidence of metastatic disease.     Patient subsequently underwent mammogram and ultrasound which showed 1. 8 cm group of highly suspicious UPPER-OUTER LEFT breast calcifications with 4.5 cm mass identified sonographically along the posterior aspect of the calcifications and a 2.3 cm mass identified sonographically along the anterior aspect of these calcifications. Tissue sampling of the posterior aspect of the 1-2 o'clock position mass 5 cm from the nipple in the anterior aspect of the 2.3 cm 2 o'clock position mass 3 cm from the nipple is recommended.2. No abnormal appearing LEFT axillary lymph nodes.3. No suspicious mammographic findings within the RIGHT breast.   Patient underwent core biopsy of both the breast masses.  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  The 2.3 cm mass was positive for grade 2 invasive mammary carcinoma and receptor testing was not done on that specimen.   CT chest abdomen pelvis With contrast from July 2025 did not show any evidence of distant metastatic disease.  MRI bilateral breast also confirmed known findings from mammogram with total mass and non-mass enhancement extending up to 9 cm in the upper outer quadrant of the left breast.  No malignancy noted on the right side.  Bone scan showed nonspecific uptake in the sternomanubrial junction which could be subacute fracture versus arthropathy.  Baseline echocardiogram shows normal EF of 60 to 65%    Interval history- Discussed the use of AI scribe software for clinical note transcription with the patient, who gave verbal consent to proceed.  History of Present Illness   Teresa [name] is a 73 year old female with HER2 positive breast cancer undergoing neoadjuvant TCHP chemotherapy who presents for ongoing cancer treatment and evaluation of persistent nasal drainage.  She is currently receiving her fifth cycle of TCHP (docetaxel , carboplatin , trastuzumab, pertuzumab ) chemotherapy for HER2 positive breast cancer of the left upper-outer quadrant, with one cycle remaining. After three cycles, ultrasound demonstrated reduction in the size of the breast mass. She is scheduled to complete her sixth cycle on October 31, 2024, with surgical consultation planned for January and surgery anticipated in early February.  She reports persistent nasal drainage, described as constant rhinorrhea and epiphora, with nasal symptoms being the most bothersome. There has been no improvement with prior use of cetirizine  or fluticasone  nasal spray. She is not currently  using any other nasal medications.  Her potassium level is mildly decreased on today's laboratory evaluation. She is taking potassium  supplementation at home and recently received a refill. She denies acute symptoms or complications related to hypokalemia.      ECOG PS- 2 Pain scale- 0   Review of systems- Review of Systems  Constitutional:  Negative for chills, fever, malaise/fatigue and weight loss.  HENT:  Negative for congestion, ear discharge and nosebleeds.        Runny nose  Eyes:  Negative for blurred vision.  Respiratory:  Negative for cough, hemoptysis, sputum production, shortness of breath and wheezing.   Cardiovascular:  Negative for chest pain, palpitations, orthopnea and claudication.  Gastrointestinal:  Negative for abdominal pain, blood in stool, constipation, diarrhea, heartburn, melena, nausea and vomiting.  Genitourinary:  Negative for dysuria, flank pain, frequency, hematuria and urgency.  Musculoskeletal:  Negative for back pain, joint pain and myalgias.  Skin:  Negative for rash.  Neurological:  Negative for dizziness, tingling, focal weakness, seizures, weakness and headaches.  Endo/Heme/Allergies:  Does not bruise/bleed easily.  Psychiatric/Behavioral:  Negative for depression and suicidal ideas. The patient does not have insomnia.       Allergies[1]   Past Medical History:  Diagnosis Date   Acute encephalopathy 05/13/2024   Allergic rhinitis    Anxiety    B12 deficiency 05/13/2024   Cystitis, acute 04/02/2014   Depression    Diabetes mellitus without complication (HCC)    GERD (gastroesophageal reflux disease)    Head injury    History of falling 10/06/2012   Hyperlipidemia    Lumbar radiculopathy 02/20/2015   Malignant neoplasm of upper-outer quadrant of left breast in female, estrogen receptor negative (HCC) 06/30/2024   SIRS (systemic inflammatory response syndrome) (HCC) 05/13/2024   Status post lumbar laminectomy 10/06/2012     Past Surgical History:  Procedure Laterality Date   ABDOMINAL HYSTERECTOMY     BACK SURGERY     2000   BREAST BIOPSY Left 06/20/2024   u/s  bx x2 clip ribbon and coil   BREAST BIOPSY Left 06/20/2024   US  LT BREAST BX W LOC DEV EA ADD LESION IMG BX SPEC US  GUIDE 06/20/2024 [facility]   BREAST BIOPSY Left 06/20/2024   US  LT BREAST BX W LOC DEV 1ST LESION IMG BX SPEC US  GUIDE 06/20/2024 [facility]   PORTACATH PLACEMENT Left 07/07/2024   Procedure: INSERTION, TUNNELED CENTRAL VENOUS DEVICE, WITH PORT;  Surgeon: Tye Millet, DO;  Location: [facility];  Service: General;  Laterality: Left;   TUBAL LIGATION      Social History   Socioeconomic History   Marital status: Married    Spouse name: Leatrice   Number of children: 3   Years of education: Not on file   Highest education level: Not on file  Occupational History    Comment: Works as social worker  Tobacco Use   Smoking status: Never   Smokeless tobacco: Not on file  Vaping Use   Vaping status: Never Used  Substance and Sexual Activity   Alcohol use: Never   Drug use: Never   Sexual activity: Not Currently  Other Topics Concern   Not on file  Social History Narrative   ** Merged History Encounter **       Social Drivers of Health   Tobacco Use: Unknown (09/28/2024)   Patient History    Smoking Tobacco Use: Never    Smokeless Tobacco Use: Unknown    Passive Exposure: Not on file  Financial Resource Strain: Patient Declined (12/07/2023)   Received from [facility]   Overall Financial Resource Strain (CARDIA)    Difficulty of Paying Living Expenses: Patient declined  Food Insecurity: No Food Insecurity (08/12/2024)   Epic    Worried About Programme Researcher, Broadcasting/film/video in the Last Year: Never true    Ran Out of Food in the Last Year: Never true  Transportation Needs: No Transportation Needs (08/12/2024)   Epic    Lack of Transportation (Medical): No    Lack of Transportation (Non-Medical): No  Physical Activity: Not on file  Stress: Not on file  Social Connections: Patient Unable To Answer (08/12/2024)   Social Connection and Isolation Panel     Frequency of Communication with Friends and Family: Patient unable to answer    Frequency of Social Gatherings with Friends and Family: Patient unable to answer    Attends Religious Services: Patient unable to answer    Active Member of Clubs or Organizations: Patient unable to answer    Attends Banker Meetings: Patient unable to answer    Marital Status: Patient unable to answer  Intimate Partner Violence: Not At Risk (08/12/2024)   Epic    Fear of Current or Ex-Partner: No    Emotionally Abused: No    Physically Abused: No    Sexually Abused: No  Depression (PHQ2-9): Low Risk (10/03/2024)   Depression (PHQ2-9)    PHQ-2 Score: 0  Alcohol Screen: Not on file  Housing: Low Risk (08/12/2024)   Epic    Unable to Pay for Housing in the Last Year: No    Number of Times Moved in the Last Year: 0    Homeless in the Last Year: No  Utilities: Not At Risk (08/12/2024)   Epic    Threatened with loss of utilities: No  Health Literacy: Adequate Health Literacy (07/05/2024)   B1300 Health Literacy    Frequency of need for help with medical instructions: Never    Family History  Problem Relation Age of Onset   Healthy Mother    Arthritis Sister    Stroke Sister    Arthritis Sister    Breast cancer Neg Hx     Current Medications[2]  Physical exam: There were no vitals filed for this visit. Physical Exam Cardiovascular:     Rate and Rhythm: Regular rhythm. Tachycardia present.     Heart sounds: Normal heart sounds.  Pulmonary:     Effort: Pulmonary effort is normal.     Breath sounds: Normal breath sounds.  Skin:    General: Skin is warm and dry.  Neurological:     Mental Status: She is alert and oriented to person, place, and time.      I have personally reviewed labs listed below:    Latest Ref Rng & Units 10/03/2024    8:50 AM  CMP  Glucose 70 - 99 mg/dL 865   BUN 8 - 23 mg/dL 14   Creatinine 9.55 - 1.00 mg/dL 9.24   Sodium 864 - 854 mmol/L 141   Potassium  3.5 - 5.1 mmol/L 3.5   Chloride 98 - 111 mmol/L 104   CO2 22 - 32 mmol/L 25   Calcium 8.9 - 10.3 mg/dL 9.1   Total Protein 6.5 - 8.1 g/dL 5.7   Total Bilirubin 0.0 - 1.2 mg/dL 0.4   Alkaline Phos 38 - 126 U/L 92   AST 15 - 41 U/L 29   ALT 0 - 44 U/L 19  Latest Ref Rng & Units 10/03/2024    8:50 AM  CBC  WBC 4.0 - 10.5 K/uL 12.6   Hemoglobin 12.0 - 15.0 g/dL 88.2   Hematocrit 63.9 - 46.0 % 36.0   Platelets 150 - 400 K/uL 181    I have personally reviewed Radiology images listed below: No images are attached to the encounter.  ECHOCARDIOGRAM COMPLETE Result Date: 09/22/2024    ECHOCARDIOGRAM REPORT   Patient Name:   LAKELA KUBA Date of Exam: 09/22/2024 Medical Rec #:  [id number]        Height:       64.0 in Accession #:    [id number]       Weight:       162.4 lb Date of Birth:  [birth year]        BSA:          1.791 m Patient Age:    73 years         BP:           157/82 mmHg Patient Gender: F                HR:           98 bpm. Exam Location:  [facility] Procedure: 2D Echo, 3D Echo, Cardiac Doppler, Color Doppler and Strain Analysis            (Both Spectral and Color Flow Doppler were utilized during            procedure). Indications:     Chemo Z09  History:         Patient has prior history of Echocardiogram examinations, most                  recent 07/03/2024. Risk Factors:Diabetes and Dyslipidemia.  Sonographer:     Christopher Furnace Referring Phys:  [id number] [facility] Kiara Mcdowell Diagnosing Phys: Redell Cave MD  Sonographer Comments: Technically challenging study due to limited acoustic windows and suboptimal apical window. Global longitudinal strain was attempted. IMPRESSIONS  1. Left ventricular ejection fraction, by estimation, is 55 to 60%. The left ventricle has normal function. The left ventricle has no regional wall motion abnormalities. There is mild left ventricular hypertrophy. Left ventricular diastolic parameters are consistent with Grade I diastolic dysfunction (impaired relaxation).   2. Right ventricular systolic function is normal. The right ventricular size is normal.  3. The mitral valve is normal in structure. No evidence of mitral valve regurgitation.  4. The aortic valve is tricuspid. Aortic valve regurgitation is not visualized. FINDINGS  Left Ventricle: Left ventricular ejection fraction, by estimation, is 55 to 60%. The left ventricle has normal function. The left ventricle has no regional wall motion abnormalities. Global longitudinal strain performed but not reported based on interpreter judgement due to suboptimal tracking. The left ventricular internal cavity size was normal in size. There is mild left ventricular hypertrophy. Left ventricular diastolic parameters are consistent with Grade I diastolic dysfunction (impaired relaxation). Right Ventricle: The right ventricular size is normal. No increase in right ventricular wall thickness. Right ventricular systolic function is normal. Left Atrium: Left atrial size was normal in size. Right Atrium: Right atrial size was normal in size. Pericardium: There is no evidence of pericardial effusion. Mitral Valve: The mitral valve is normal in structure. No evidence of mitral valve regurgitation. MV peak gradient, 6.2 mmHg. The mean mitral valve gradient is 3.0 mmHg. Tricuspid Valve: The tricuspid valve is normal in structure. Tricuspid valve regurgitation is not demonstrated.  Aortic Valve: The aortic valve is tricuspid. Aortic valve regurgitation is not visualized. Aortic valve mean gradient measures 2.0 mmHg. Aortic valve peak gradient measures 4.4 mmHg. Aortic valve area, by VTI measures 4.34 cm. Pulmonic Valve: The pulmonic valve was not well visualized. Pulmonic valve regurgitation is not visualized. Aorta: The aortic root is normal in size and structure. Venous: The inferior vena cava was not well visualized. IAS/Shunts: No atrial level shunt detected by color flow Doppler.  LEFT VENTRICLE PLAX 2D LVIDd:         3.30 cm   Diastology  LVIDs:         2.40 cm   LV e' medial:    7.94 cm/s LV PW:         0.90 cm   LV E/e' medial:  6.4 LV IVS:        1.30 cm   LV e' lateral:   10.60 cm/s LVOT diam:     2.00 cm   LV E/e' lateral: 4.8 LV SV:         57 LV SV Index:   32 LVOT Area:     3.14 cm LV IVRT:       87 msec  RIGHT VENTRICLE RV Basal diam:  2.30 cm     PULMONARY VEINS RV Mid diam:    2.20 cm     Diastolic Velocity: 67.60 cm/s RV S prime:     19.00 cm/s  S/D Velocity:       0.80 TAPSE (M-mode): 1.7 cm      Systolic Velocity:  55.50 cm/s LEFT ATRIUM           Index       RIGHT ATRIUM           Index LA diam:      2.20 cm 1.23 cm/m  RA Area:     13.90 cm LA Vol (A2C): 8.7 ml  4.84 ml/m  RA Volume:   34.10 ml  19.04 ml/m LA Vol (A4C): 10.8 ml 6.03 ml/m  AORTIC VALVE AV Area (Vmax):    3.38 cm AV Area (Vmean):   3.77 cm AV Area (VTI):     4.34 cm AV Vmax:           105.00 cm/s AV Vmean:          71.200 cm/s AV VTI:            0.131 m AV Peak Grad:      4.4 mmHg AV Mean Grad:      2.0 mmHg LVOT Vmax:         113.00 cm/s LVOT Vmean:        85.500 cm/s LVOT VTI:          0.181 m LVOT/AV VTI ratio: 1.38  AORTA Ao Root diam: 3.10 cm MITRAL VALVE               TRICUSPID VALVE MV Area (PHT): 6.96 cm    TR Peak grad:   9.2 mmHg MV Area VTI:   4.06 cm    TR Vmax:        152.00 cm/s MV Peak grad:  6.2 mmHg MV Mean grad:  3.0 mmHg    SHUNTS MV Vmax:       1.24 m/s    Systemic VTI:  0.18 m MV Vmean:      72.7 cm/s   Systemic Diam: 2.00 cm MV Decel Time: 109 msec MV E velocity: 50.80 cm/s MV A  velocity: 87.50 cm/s MV E/A ratio:  0.58 Redell Cave MD Electronically signed by Redell Cave MD Signature Date/Time: 09/22/2024/2:18:44 PM    Final      Assessment and plan- Patient is a 73 y.o. 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 One cycle remains, with surgery planned for early February. Adjuvant therapy will depend on surgical pathology. - Administer sixth and final cycle of TCHP chemotherapy as scheduled. - Coordinate surgical consultation; surgery anticipated for early February. - Communicate with surgeon regarding completion of neoadjuvant therapy and surgical planning. - Plan post-surgical assessment to determine need for further adjuvant therapy.  Antineoplastic chemotherapy and monoclonal antibody therapy management Receiving TCHP regimen. Post-surgical therapy will be guided by pathologic response. - Continue current TCHP regimen for one additional cycle.  Chronic rhinitis Persistent rhinorrhea refractory to oral antihistamine and intranasal corticosteroid. Next step is intranasal antihistamine trial. - Prescribed azelastine  nasal spray, two sprays in each nostril twice daily. - Sent prescription to pharmacy on [address]. - If symptoms persist despite azelastine , consider ENT referral for further evaluation.  Hypokalemia Mild hypokalemia, asymptomatic, maintained on potassium supplementation. - Continue current potassium supplementation.         Visit Diagnosis 1. Encounter for antineoplastic chemotherapy   2. Encounter for monoclonal antibody treatment for malignancy   3. Malignant neoplasm of upper-outer quadrant of left breast in female, estrogen receptor negative (HCC)      Dr. Annah Skene, MD, MPH [facility] [phone] 10/10/2024 9:12 AM                    [1]  Allergies Allergen Reactions   Aleve [Naproxen Sodium]     Facial swelling   Aspirin     REACTION: nausea  [2]  Current Outpatient Medications:    acetaminophen  (TYLENOL ) 500 MG tablet, Take 1 tablet (500 mg total) by mouth every 4  (four) hours as needed., Disp: 120 tablet, Rfl: 1   atenolol  (TENORMIN ) 25 MG tablet, Take 25 mg by mouth daily. (Patient not taking: Reported on 09/28/2024), Disp: , Rfl:    cetirizine  (ZYRTEC ) 10 MG tablet, Take 1 tablet (10 mg total) by mouth daily. (Patient taking differently: Take 10 mg by mouth daily as needed for allergies.), Disp: 30 tablet, Rfl: 3   dexamethasone  (DECADRON ) 4 MG tablet, Take 2 tabs by mouth 2 times daily starting day before chemo. Then take 2 tabs daily for 2 days starting day after chemo. Take with food., Disp: 30 tablet, Rfl: 1   fluticasone  (FLONASE ) 50 MCG/ACT nasal spray, Place 2 sprays into both nostrils daily., Disp: 1 g, Rfl: 2   lidocaine -prilocaine  (EMLA ) cream, Apply to affected area once, Disp: 30 g, Rfl: 3   LORazepam  (ATIVAN ) 0.5 MG tablet, Take 1 tablet (0.5 mg total) by mouth every 8 (eight) hours as needed for anxiety., Disp: 30 tablet, Rfl: 0   Multiple Vitamin (MULTIVITAMIN WITH MINERALS) TABS tablet, Take 1 tablet by mouth daily., Disp: , Rfl:    ondansetron  (ZOFRAN ) 8 MG tablet, Take 1 tablet (8 mg total) by mouth every 8 (eight) hours as needed for nausea or vomiting. Start  on the third day after chemotherapy., Disp: 30 tablet, Rfl: 1   potassium chloride  (KLOR-CON  M) 10 MEQ tablet, Take 1 tablet (10 mEq total) by mouth daily., Disp: 14 tablet, Rfl: 0   prochlorperazine  (COMPAZINE ) 10 MG tablet, Take 1 tablet (10 mg total) by mouth every 6 (six) hours as needed for nausea or vomiting., Disp: 30 tablet, Rfl: 1   simvastatin  (ZOCOR ) 10 MG tablet, Take 10 mg by mouth daily., Disp: , Rfl:    traMADol  (ULTRAM ) 50 MG tablet, Take 1 tablet (50 mg total) by mouth every 8 (eight) hours as needed. (Patient not taking: Reported on 09/28/2024), Disp: 6 tablet, Rfl: 0   venlafaxine  XR (EFFEXOR -XR) 75 MG 24 hr capsule, Take 75 mg by mouth daily., Disp: , Rfl:  No current facility-administered medications for this visit.  Facility-Administered Medications Ordered in  Other Visits:    0.9 %  sodium chloride  infusion, , Intravenous, Continuous, Melanee Annah BROCKS, MD, Stopped at 08/29/24 1315

## 2024-10-10 NOTE — Progress Notes (Signed)
 Nutrition Follow-up:  Patient with left breast cancer.  Receiving TCHP chemotherapy.   Met with patient and husband during infusion.  Husband reports that patient is eating. Ate 3 pancakes this am with fruit.  Likes fruit.  Goes to bed early in the afternoon some days which effects eating.  Drinking protein drink but not daily.      Medications: reviewed  Labs: K 3.3, glucose 169  Anthropometrics:   Weight 156 lb 11.2 oz 162 lb 6.4 oz on 11/25 167 lb 4.8 oz on 10/14  6% weight loss in the last 2 months  NUTRITION DIAGNOSIS: Inadequate oral intake related to cancer treatment side effects as evidenced by 6%  weight loss in the last 2 months   INTERVENTION:  Recommend at least 1 protein drink daily for added nutrition Encouraged eating q 2-3 hours    MONITORING, EVALUATION, GOAL: weight trends, intake   NEXT VISIT: Tuesday, Jan 6 during infusion  Muriah Harsha B. Dasie SOLON, CSO, LDN Registered Dietitian [phone]

## 2024-10-10 NOTE — Progress Notes (Signed)
 Patient says her eyes & nose have been running since she started chemotherapy. She was instructed by infusion/fluid clinic to take zyrtec  and it didn't help. Then she was instructed to use Flonase  which isn't  working as well.  Patient is losing weight as well.

## 2024-10-11 ENCOUNTER — Other Ambulatory Visit: Payer: Self-pay

## 2024-10-12 ENCOUNTER — Encounter: Payer: Self-pay | Admitting: Licensed Clinical Social Worker

## 2024-10-12 ENCOUNTER — Inpatient Hospital Stay

## 2024-10-12 ENCOUNTER — Inpatient Hospital Stay: Admitting: Licensed Clinical Social Worker

## 2024-10-12 DIAGNOSIS — Z5111 Encounter for antineoplastic chemotherapy: Secondary | ICD-10-CM | POA: Diagnosis not present

## 2024-10-12 DIAGNOSIS — Z1509 Genetic susceptibility to other malignant neoplasm: Secondary | ICD-10-CM

## 2024-10-12 DIAGNOSIS — C50412 Malignant neoplasm of upper-outer quadrant of left female breast: Secondary | ICD-10-CM

## 2024-10-12 DIAGNOSIS — Z1379 Encounter for other screening for genetic and chromosomal anomalies: Secondary | ICD-10-CM | POA: Insufficient documentation

## 2024-10-12 DIAGNOSIS — Z1501 Genetic susceptibility to malignant neoplasm of breast: Secondary | ICD-10-CM | POA: Insufficient documentation

## 2024-10-12 HISTORY — DX: Genetic susceptibility to other malignant neoplasm: Z15.09

## 2024-10-12 MED ORDER — PEGFILGRASTIM-JMDB 6 MG/0.6ML ~~LOC~~ SOSY
6.0000 mg | PREFILLED_SYRINGE | Freq: Once | SUBCUTANEOUS | Status: AC
  Administered 2024-10-12: 15:00:00 6 mg via SUBCUTANEOUS
  Filled 2024-10-12: qty 0.6

## 2024-10-12 NOTE — Progress Notes (Signed)
 Genetic Test Results - RAD51C+  HPI:   Teresa [name] was previously seen in the [facility] due to a personal history of breast cancer and concerns regarding a hereditary predisposition to cancer. Please refer to our prior cancer genetics clinic note for more information regarding our discussion, assessment and recommendations, at the time. Teresa [name] recent genetic test results were disclosed to her, as were recommendations warranted by these results. These results and recommendations are discussed in more detail below.  CANCER HISTORY:  Oncology History  Malignant neoplasm of upper-outer quadrant of left breast in female, estrogen receptor negative (HCC)  06/30/2024 Initial Diagnosis   Malignant neoplasm of upper-outer quadrant of left breast in female, estrogen receptor negative (HCC)   06/30/2024 Cancer Staging   Staging form: Breast, AJCC 8th Edition - Clinical stage from 06/30/2024: Stage IIB (cT3, cN0, cM0, G3, ER-, PR-, HER2+) - Signed by Melanee Annah BROCKS, MD on 06/30/2024 Stage prefix: Initial diagnosis Histologic grading system: 3 grade system   07/18/2024 -  Chemotherapy   Patient is on Treatment Plan : BREAST  Docetaxel  + Carboplatin  + Trastuzumab + Pertuzumab   (TCHP) q21d        FAMILY HISTORY:  We obtained a detailed, 4-generation family history.  Significant diagnoses are listed below: Family History  Problem Relation Age of Onset   Healthy Mother    Arthritis Sister    Lung cancer Sister    Stroke Sister    Arthritis Sister    Colon cancer Brother    Breast cancer Neg Hx        Teresa [name] is unaware of previous family history of genetic testing for hereditary cancer risks. There is no reported Ashkenazi Jewish ancestry. There is no known consanguinity.  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   The [facility]RNAinsight Panel includes sequencing, rearrangement analysis, and RNA analysis for the following 40 genes: APC, ATM, BAP1, BARD1, BMPR1A, BRCA1, BRCA2, BRIP1, CDH1, CDKN2A, CHEK2, FH, FLCN, MET, MLH1, MSH2, MSH6, MUTYH, NF1, NTHL1, PALB2, PMS2, PTEN, RAD51C, RAD51D, RPS20, SMAD4, STK11, TP53, TSC1, TSC2, and VHL (sequencing and deletion/duplication); AXIN2, HOXB13, MBD4, MSH3, POLD1 and POLE (sequencing only); EPCAM and GREM1 (deletion/duplication only).   The test report has been scanned into EPIC and is located under the Molecular Pathology section of the Results Review tab.  A portion of the result report is included below for reference. Genetic testing reported out on 08/31/2024.SABRA      Cancer Risks for RAD51C: Female breast cancer, ~20% risk Ovarian cancer, 10-15% Currently, there is no known increase in cancer risk for males with a RAD51C mutation.   Management Recommendations:   Breast Cancer Screening/Risk Reduction: Breast cancer screening includes: Breast awareness beginning at age 18 Monthly self-breast examination beginning at age 62 Clinical breast examination every 6-12 months beginning at age 6 or at the age of the earliest diagnosed breast cancer in the family, if onset was before age 61 Annual mammogram starting at age 37 or 10 years prior to the youngest age of diagnosis, whichever comes first Consider breast MRI with and without contrast starting at age 59 Evidence is insufficient for a prophylactic risk-reducing mastectomy, manage based on family history    Ovarian Cancer Screening/Risk Reduction: A risk-reducing salpingo oophorectomy (RRSO), removal of the ovaries and fallopian tubes, is recommended starting at age 61-50 or earlier based  on a specific family history of an earlier onset ovarian cancer. Having a RRSO is estimated to reduce the risk of ovarian cancer by up to 96%. There is still a small risk of developing an ovarian-like  cancer in the lining of the abdomen, called the peritoneum.   Additional Considerations and Screening Recommendations: Individuals with a single pathogenic variant in RAD51C are also carriers of autosomal recessive Fanconi anemia. Fanconi anemia is characterized by bone marrow failure with variable additional anomalies that often include short stature, abnormal skin pigmentation, abnormal thumbs, malformations of the skeletal and central nervous systems, and developmental delay. Risks for leukemia and early-onset solid tumors are significantly elevated with this disorder. For there to be a risk of Fanconi anemia in offspring, both parents would each have to have a pathogenic variant in RAD51C; in this case, the risk of having an affected child is 25%.   This information is based on current understanding of the gene and may change in the future.   Implications for Family Members: Hereditary predisposition to cancer due to pathogenic variants in the RAD51C gene has autosomal dominant inheritance. This means that an individual with a pathogenic variant has a 50% chance of passing the condition on to his/her offspring. Identification of a pathogenic variant allows for the recognition of at-risk relatives who can pursue testing for the familial variant.   Family members are encouraged to consider genetic testing for this familial pathogenic variant. As there are generally no childhood cancer risks associated with pathogenic variants in the RAD51C gene, individuals in the family are not recommended to have testing until they reach at least 71 years of age. They may contact our office at [phone] for more information or to schedule an appointment.  Complimentary testing for the familial variant is available for 90 days from the report date (08/31/2024).  Family members who live outside of the area are encouraged to find a genetic counselor in their area by visiting:  [email or web address].  PLAN:   1. These results will be made available to  her oncologist, Dr. Melanee and her PCP, Dr. Diedra.. She would like these providers to follow her long-term for this indication and coordinate screening/prophylactic surgeries.    2. Teresa [name] plans to discuss these results with her family and will reach out to us  if we can be of any assistance in coordinating genetic testing for any relatives. Her daughter, Teresa [name], had blood drawn for testing today.   We encouraged Teresa [name] to remain in contact with us  on an annual basis so we can update her personal and family histories, and let her know of advances in cancer genetics that may benefit the family. Our contact number was provided. Teresa [name] questions were answered to her satisfaction today, and she knows she is welcome to call anytime with additional questions.   Dena Cary, MS, [facility] Genetic Counselor [email or web address] Phone: [phone]

## 2024-10-31 ENCOUNTER — Inpatient Hospital Stay

## 2024-10-31 ENCOUNTER — Encounter: Payer: Self-pay | Admitting: Oncology

## 2024-10-31 ENCOUNTER — Encounter: Payer: Self-pay | Admitting: *Deleted

## 2024-10-31 ENCOUNTER — Inpatient Hospital Stay: Attending: Oncology | Admitting: Oncology

## 2024-10-31 ENCOUNTER — Inpatient Hospital Stay: Attending: Oncology

## 2024-10-31 VITALS — BP 130/85 | HR 104 | Temp 97.8°F | Resp 18 | Ht 64.0 in | Wt 155.1 lb

## 2024-10-31 VITALS — BP 143/85 | HR 105

## 2024-10-31 DIAGNOSIS — Z5112 Encounter for antineoplastic immunotherapy: Secondary | ICD-10-CM

## 2024-10-31 DIAGNOSIS — C50412 Malignant neoplasm of upper-outer quadrant of left female breast: Secondary | ICD-10-CM | POA: Insufficient documentation

## 2024-10-31 DIAGNOSIS — Z171 Estrogen receptor negative status [ER-]: Secondary | ICD-10-CM | POA: Insufficient documentation

## 2024-10-31 DIAGNOSIS — Z1731 Human epidermal growth factor receptor 2 positive status: Secondary | ICD-10-CM

## 2024-10-31 DIAGNOSIS — Z1722 Progesterone receptor negative status: Secondary | ICD-10-CM | POA: Diagnosis not present

## 2024-10-31 DIAGNOSIS — Z801 Family history of malignant neoplasm of trachea, bronchus and lung: Secondary | ICD-10-CM | POA: Diagnosis not present

## 2024-10-31 DIAGNOSIS — R296 Repeated falls: Secondary | ICD-10-CM | POA: Insufficient documentation

## 2024-10-31 DIAGNOSIS — G8929 Other chronic pain: Secondary | ICD-10-CM | POA: Diagnosis not present

## 2024-10-31 DIAGNOSIS — Z1509 Genetic susceptibility to other malignant neoplasm: Secondary | ICD-10-CM | POA: Diagnosis not present

## 2024-10-31 DIAGNOSIS — Z5189 Encounter for other specified aftercare: Secondary | ICD-10-CM | POA: Diagnosis not present

## 2024-10-31 DIAGNOSIS — Z1501 Genetic susceptibility to malignant neoplasm of breast: Secondary | ICD-10-CM | POA: Diagnosis not present

## 2024-10-31 DIAGNOSIS — Z800 Family history of malignant neoplasm of digestive organs: Secondary | ICD-10-CM | POA: Insufficient documentation

## 2024-10-31 DIAGNOSIS — Z1502 Genetic susceptibility to malignant neoplasm of ovary: Secondary | ICD-10-CM | POA: Diagnosis not present

## 2024-10-31 DIAGNOSIS — Z5111 Encounter for antineoplastic chemotherapy: Secondary | ICD-10-CM | POA: Insufficient documentation

## 2024-10-31 LAB — CMP (CANCER CENTER ONLY)
ALT: 14 U/L (ref 0–44)
AST: 25 U/L (ref 15–41)
Albumin: 3.8 g/dL (ref 3.5–5.0)
Alkaline Phosphatase: 55 U/L (ref 38–126)
Anion gap: 11 (ref 5–15)
BUN: 14 mg/dL (ref 8–23)
CO2: 28 mmol/L (ref 22–32)
Calcium: 9.3 mg/dL (ref 8.9–10.3)
Chloride: 101 mmol/L (ref 98–111)
Creatinine: 0.61 mg/dL (ref 0.44–1.00)
GFR, Estimated: >60 mL/min
Glucose, Bld: 123 mg/dL — ABNORMAL HIGH (ref 70–99)
Potassium: 3.4 mmol/L — ABNORMAL LOW (ref 3.5–5.1)
Sodium: 141 mmol/L (ref 135–145)
Total Bilirubin: 0.5 mg/dL (ref 0.0–1.2)
Total Protein: 5.7 g/dL — ABNORMAL LOW (ref 6.5–8.1)

## 2024-10-31 LAB — CBC WITH DIFFERENTIAL (CANCER CENTER ONLY)
Abs Immature Granulocytes: 0.01 K/uL (ref 0.00–0.07)
Basophils Absolute: 0 K/uL (ref 0.0–0.1)
Basophils Relative: 1 %
Eosinophils Absolute: 0 K/uL (ref 0.0–0.5)
Eosinophils Relative: 0 %
HCT: 33.4 % — ABNORMAL LOW (ref 36.0–46.0)
Hemoglobin: 10.8 g/dL — ABNORMAL LOW (ref 12.0–15.0)
Immature Granulocytes: 0 %
Lymphocytes Relative: 38 %
Lymphs Abs: 1.9 K/uL (ref 0.7–4.0)
MCH: 28.3 pg (ref 26.0–34.0)
MCHC: 32.3 g/dL (ref 30.0–36.0)
MCV: 87.4 fL (ref 80.0–100.0)
Monocytes Absolute: 0.7 K/uL (ref 0.1–1.0)
Monocytes Relative: 15 %
Neutro Abs: 2.4 K/uL (ref 1.7–7.7)
Neutrophils Relative %: 46 %
Platelet Count: 231 K/uL (ref 150–400)
RBC: 3.82 MIL/uL — ABNORMAL LOW (ref 3.87–5.11)
RDW: 17.1 % — ABNORMAL HIGH (ref 11.5–15.5)
WBC Count: 5 K/uL (ref 4.0–10.5)
nRBC: 0 % (ref 0.0–0.2)

## 2024-10-31 MED ORDER — ACETAMINOPHEN 325 MG PO TABS
650.0000 mg | ORAL_TABLET | Freq: Once | ORAL | Status: AC
  Administered 2024-10-31: 650 mg via ORAL
  Filled 2024-10-31: qty 2

## 2024-10-31 MED ORDER — APREPITANT 130 MG/18ML IV EMUL
130.0000 mg | Freq: Once | INTRAVENOUS | Status: AC
  Administered 2024-10-31: 130 mg via INTRAVENOUS
  Filled 2024-10-31: qty 18

## 2024-10-31 MED ORDER — SODIUM CHLORIDE 0.9 % IV SOLN
INTRAVENOUS | Status: DC
  Filled 2024-10-31: qty 250

## 2024-10-31 MED ORDER — DEXAMETHASONE SOD PHOSPHATE PF 10 MG/ML IJ SOLN
10.0000 mg | Freq: Once | INTRAMUSCULAR | Status: AC
  Administered 2024-10-31: 10 mg via INTRAVENOUS

## 2024-10-31 MED ORDER — DIPHENHYDRAMINE HCL 25 MG PO TABS
50.0000 mg | ORAL_TABLET | Freq: Once | ORAL | Status: AC
  Administered 2024-10-31: 50 mg via ORAL
  Filled 2024-10-31: qty 2

## 2024-10-31 MED ORDER — PERTUZ-TRASTUZ-HYALURON-ZZXF 60-60-2000 MG-MG-U/ML CHEMO ~~LOC~~ SOLN
600.0000 mg | Freq: Once | SUBCUTANEOUS | Status: AC
  Administered 2024-10-31: 600 mg via SUBCUTANEOUS
  Filled 2024-10-31: qty 10

## 2024-10-31 MED ORDER — PALONOSETRON HCL INJECTION 0.25 MG/5ML
0.2500 mg | Freq: Once | INTRAVENOUS | Status: AC
  Administered 2024-10-31: 0.25 mg via INTRAVENOUS
  Filled 2024-10-31: qty 5

## 2024-10-31 MED ORDER — SODIUM CHLORIDE 0.9 % IV SOLN
432.5000 mg | Freq: Once | INTRAVENOUS | Status: AC
  Administered 2024-10-31: 430 mg via INTRAVENOUS
  Filled 2024-10-31: qty 43

## 2024-10-31 MED ORDER — SODIUM CHLORIDE 0.9 % IV SOLN
75.0000 mg/m2 | Freq: Once | INTRAVENOUS | Status: AC
  Administered 2024-10-31: 139 mg via INTRAVENOUS
  Filled 2024-10-31: qty 13.9

## 2024-10-31 NOTE — Progress Notes (Signed)
 Patient's husband has some concerns to address during today's visit.

## 2024-10-31 NOTE — Progress Notes (Addendum)
 "    Hematology/Oncology Consult note [facility]  [phone] [phone]  Patient Care Team: Diedra Lame, MD as PCP - General (Family Medicine) Georgina Shasta POUR, RN as Oncology Nurse Navigator Melanee Annah BROCKS, MD as Consulting Physician (Oncology)   Name of the patient: Teresa [name]  [id number]  03-Mar-1951   Date of visit: 10/31/2024  Diagnosis-  Cancer Staging  Malignant neoplasm of upper-outer quadrant of left breast in female, estrogen receptor negative (HCC) Staging form: Breast, AJCC 8th Edition - Clinical stage from 06/30/2024: Stage IIB (cT3, cN0, cM0, G3, ER-, PR-, HER2+) - Signed by Melanee Annah BROCKS, MD on 06/30/2024 Stage prefix: Initial diagnosis Histologic grading system: 3 grade system    Chief complaint/ Reason for visit-on treatment assessment prior to cycle 6 of neoadjuvant TCHP chemotherapy  Heme/Onc history: patient is a 74 year old female with a past medical history significant for chronic back pain status post lumbar laminectomy, type 2 diabetes who presented to the ER with symptoms of bodyaches and underwent CT chest abdomen and pelvis with contrast which incidentally showed left breast mass but no evidence of metastatic disease.     Patient subsequently underwent mammogram and ultrasound which showed 1. 8 cm group of highly suspicious UPPER-OUTER LEFT breast calcifications with 4.5 cm mass identified sonographically along the posterior aspect of the calcifications and a 2.3 cm mass identified sonographically along the anterior aspect of these calcifications. Tissue sampling of the posterior aspect of the 1-2 o'clock position mass 5 cm from the nipple in the anterior aspect of the 2.3 cm 2 o'clock position mass 3 cm from the nipple is recommended.2. No abnormal appearing LEFT axillary lymph nodes.3. No suspicious mammographic findings within the RIGHT breast.   Patient underwent core biopsy of both the breast masses.  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  The 2.3 cm mass was positive for grade 2 invasive mammary carcinoma and receptor testing was not done on that specimen.   CT chest abdomen pelvis With contrast from July 2025 did not show any evidence of distant metastatic disease.  MRI bilateral breast also confirmed known findings from mammogram with total mass and non-mass enhancement extending up to 9 cm in the upper outer quadrant of the left breast.  No malignancy noted on the right side.  Bone scan showed nonspecific uptake in the sternomanubrial junction which could be subacute fracture versus arthropathy.  Baseline echocardiogram shows normal EF of 60 to 65%.  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  She has completed six cycles of TCHP chemotherapy for HER2-positive breast cancer, with today marking her final planned cytotoxic treatment. She has experienced chemotherapy-related alopecia and is hoping for gradual hair regrowth. She received her last booster shot two days prior to this visit.  She is scheduled to meet with the breast surgeon in one week to discuss surgical management, with prior discussions focused on lumpectomy. She does not carry a BRCA mutation, but her RAD51C mutation  confers increased risk for breast and ovarian cancer.  She continues to experience significant impaired mobility and recurrent falls, with four  falls since her last oncology visit, including one requiring emergency assistance. She has had longstanding lower extremity weakness and impaired ambulation since her back surgery in July 2021. She uses a walker for transfers and is unable to stand without assistance. She denies lower extremity numbness or paresthesia, but notes mild hand symptoms that are not currently severe. Home physical therapy is ongoing, though her caregiver reports worsening mobility since the surgery.     ECOG PS- 2 Pain scale- 4 Opioid associated constipation- no  Review of systems- Review of Systems  Constitutional:  Positive for malaise/fatigue. Negative for chills, fever and weight loss.  HENT:  Negative for congestion, ear discharge and nosebleeds.   Eyes:  Negative for blurred vision.  Respiratory:  Negative for cough, hemoptysis, sputum production, shortness of breath and wheezing.   Cardiovascular:  Negative for chest pain, palpitations, orthopnea and claudication.  Gastrointestinal:  Negative for abdominal pain, blood in stool, constipation, diarrhea, heartburn, melena, nausea and vomiting.  Genitourinary:  Negative for dysuria, flank pain, frequency, hematuria and urgency.  Musculoskeletal:  Positive for back pain and falls. Negative for joint pain and myalgias.  Skin:  Negative for rash.  Neurological:  Negative for dizziness, tingling, focal weakness, seizures, weakness and headaches.  Endo/Heme/Allergies:  Does not bruise/bleed easily.  Psychiatric/Behavioral:  Negative for depression and suicidal ideas. The patient does not have insomnia.       Allergies[1]   Past Medical History:  Diagnosis Date   Acute encephalopathy 05/13/2024   Allergic rhinitis    Anxiety    B12 deficiency 05/13/2024   Cystitis, acute 04/02/2014   Depression    Diabetes mellitus without complication (HCC)    GERD (gastroesophageal reflux disease)    Head injury    History of falling 10/06/2012   Hyperlipidemia     Lumbar radiculopathy 02/20/2015   Malignant neoplasm of upper-outer quadrant of left breast in female, estrogen receptor negative (HCC) 06/30/2024   SIRS (systemic inflammatory response syndrome) (HCC) 05/13/2024   Status post lumbar laminectomy 10/06/2012     Past Surgical History:  Procedure Laterality Date   ABDOMINAL HYSTERECTOMY     BACK SURGERY     2000   BREAST BIOPSY Left 06/20/2024   u/s bx x2 clip ribbon and coil   BREAST BIOPSY Left 06/20/2024   US  LT BREAST BX W LOC DEV EA ADD LESION IMG BX SPEC US  GUIDE 06/20/2024 [facility]   BREAST BIOPSY Left 06/20/2024   US  LT BREAST BX W LOC DEV 1ST LESION IMG BX SPEC US  GUIDE 06/20/2024 [facility]   PORTACATH PLACEMENT Left 07/07/2024   Procedure: INSERTION, TUNNELED CENTRAL VENOUS DEVICE, WITH PORT;  Surgeon: Tye Millet, DO;  Location: [facility];  Service: General;  Laterality: Left;   TUBAL LIGATION      Social History   Socioeconomic History   Marital status: Married    Spouse name: Leatrice   Number of children: 3   Years of education: Not on file   Highest education level: Not on file  Occupational History    Comment: Works as social worker  Tobacco Use   Smoking status: Never   Smokeless tobacco: Not on file  Vaping Use   Vaping status: Never Used  Substance and Sexual Activity   Alcohol use: Never   Drug use: Never   Sexual activity: Not Currently  Other Topics Concern   Not  on file  Social History Narrative   ** Merged History Encounter **       Social Drivers of Health   Tobacco Use: Unknown (10/31/2024)   Patient History    Smoking Tobacco Use: Never    Smokeless Tobacco Use: Unknown    Passive Exposure: Not on file  Financial Resource Strain: Patient Declined (12/07/2023)   Received from [facility]   Overall Financial Resource Strain (CARDIA)    Difficulty of Paying Living Expenses: Patient declined  Food Insecurity: No Food Insecurity (08/12/2024)   Epic    Worried About  Programme Researcher, Broadcasting/film/video in the Last Year: Never true    Ran Out of Food in the Last Year: Never true  Transportation Needs: No Transportation Needs (08/12/2024)   Epic    Lack of Transportation (Medical): No    Lack of Transportation (Non-Medical): No  Physical Activity: Not on file  Stress: Not on file  Social Connections: Patient Unable To Answer (08/12/2024)   Social Connection and Isolation Panel    Frequency of Communication with Friends and Family: Patient unable to answer    Frequency of Social Gatherings with Friends and Family: Patient unable to answer    Attends Religious Services: Patient unable to answer    Active Member of Clubs or Organizations: Patient unable to answer    Attends Banker Meetings: Patient unable to answer    Marital Status: Patient unable to answer  Intimate Partner Violence: Not At Risk (08/12/2024)   Epic    Fear of Current or Ex-Partner: No    Emotionally Abused: No    Physically Abused: No    Sexually Abused: No  Depression (PHQ2-9): Low Risk (10/31/2024)   Depression (PHQ2-9)    PHQ-2 Score: 0  Alcohol Screen: Not on file  Housing: Low Risk (08/12/2024)   Epic    Unable to Pay for Housing in the Last Year: No    Number of Times Moved in the Last Year: 0    Homeless in the Last Year: No  Utilities: Not At Risk (08/12/2024)   Epic    Threatened with loss of utilities: No  Health Literacy: Adequate Health Literacy (07/05/2024)   B1300 Health Literacy    Frequency of need for help with medical instructions: Never    Family History  Problem Relation Age of Onset   Healthy Mother    Arthritis Sister    Lung cancer Sister    Stroke Sister    Arthritis Sister    Colon cancer Brother    Breast cancer Neg Hx     Current Medications[2]  Physical exam:  Vitals:   10/31/24 1019  BP: 130/85  Pulse: (!) 104  Resp: 18  Temp: 97.8 F (36.6 C)  TempSrc: Tympanic  SpO2: 100%  Weight: 155 lb 1.6 oz (70.4 kg)  Height: 5' 4 (1.626  m)   Physical Exam Constitutional:      Comments: Sitting in a wheelchair.  Appears in no acute distress  Cardiovascular:     Rate and Rhythm: Normal rate and regular rhythm.     Heart sounds: Normal heart sounds.  Pulmonary:     Effort: Pulmonary effort is normal.     Breath sounds: Normal breath sounds.  Abdominal:     General: Bowel sounds are normal.     Palpations: Abdomen is soft.  Skin:    General: Skin is warm and dry.  Neurological:     Mental Status: She is  alert and oriented to person, place, and time.      I have personally reviewed labs listed below:    Latest Ref Rng & Units 10/31/2024    9:34 AM  CMP  Glucose 70 - 99 mg/dL 876   BUN 8 - 23 mg/dL 14   Creatinine 9.55 - 1.00 mg/dL 9.38   Sodium 864 - 854 mmol/L 141   Potassium 3.5 - 5.1 mmol/L 3.4   Chloride 98 - 111 mmol/L 101   CO2 22 - 32 mmol/L 28   Calcium 8.9 - 10.3 mg/dL 9.3   Total Protein 6.5 - 8.1 g/dL 5.7   Total Bilirubin 0.0 - 1.2 mg/dL 0.5   Alkaline Phos 38 - 126 U/L 55   AST 15 - 41 U/L 25   ALT 0 - 44 U/L 14       Latest Ref Rng & Units 10/31/2024    9:34 AM  CBC  WBC 4.0 - 10.5 K/uL 5.0   Hemoglobin 12.0 - 15.0 g/dL 89.1   Hematocrit 63.9 - 46.0 % 33.4   Platelets 150 - 400 K/uL 231      Assessment and plan- Patient is a 74 y.o. 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  She is here for on treatment assessment prior to cycle 6 of neoadjuvant TCHP chemotherapy  Assessment and Plan    HER2-positive breast cancer of the left upper-outer quadrant Counts okay to proceed with cycle 6 of neoadjuvant TCHP chemotherapy today which would be her last cycle.  She has an upcoming appointment with Dr. Tye next week to discuss surgery.  I will defer any preoperative imaging including MRI if need be to Dr. 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  Risk reduction bilateral mastectomy is not absolutely necessary.  Should she choose to proceed with lumpectomy she will need mammograms alternating with MRIs in the future for surveillance  - Plan to continue HP antibody therapy IV every three weeks for one year postoperatively if no residual cancer is found at surgery. - Plan to transition to Kadcyla IV every three weeks for one year postoperatively if residual cancer is found at surgery; discussed that both regimens are less intense than cytotoxic chemotherapy. - Scheduled follow-up in two months to review surgical pathology and determine ongoing therapy. - Provided anticipatory guidance regarding recovery from chemotherapy, expected regrowth of hair over several months, and gradual improvement in chemotherapy-related side effects.  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 Close surveillance warranted. Prophylactic bilateral salpingo-oophorectomy recommended post-recovery from breast cancer surgery. - Discussed genetic risk and implications with her. - Recommended annual breast surveillance with alternating mammogram and breast MRI every six months. - Discussed option of bilateral mastectomy versus lumpectomy; advised further discussion with breast surgeon, noting that bilateral mastectomy is not required but may be considered based on personal preference and risk tolerance. - Recommended prophylactic bilateral salpingo-oophorectomy and referral to gynecologic surgeon after recovery from breast cancer surgery.     Recurrent falls: Patient has had chronic back pain and poor mobility even prior to starting chemotherapy. She is currently receiving physical therapy at home    Visit Diagnosis 1. Malignant neoplasm of upper-outer quadrant of left breast in female, estrogen receptor negative (HCC)  2. Encounter  for antineoplastic chemotherapy   3. Encounter for monoclonal antibody treatment for malignancy      Dr. Annah Skene, MD, MPH [facility] [phone] 10/31/2024 12:58 PM                   [1]  Allergies Allergen Reactions   Aleve [Naproxen Sodium]     Facial swelling   Aspirin     REACTION: nausea  [2]  Current Outpatient Medications:    acetaminophen  (TYLENOL ) 500 MG tablet, Take 1 tablet (500 mg total) by mouth every 4 (four) hours as needed., Disp: 120 tablet, Rfl: 1   azelastine  (ASTELIN ) 0.1 % nasal spray, Place 2 sprays into both nostrils 2 (two) times daily. Use in each nostril as directed, Disp: 30 mL, Rfl: 5   dexamethasone  (DECADRON ) 4 MG tablet, Take 2 tabs by mouth 2 times daily starting day before chemo. Then take 2 tabs daily for 2 days starting day after chemo. Take with food., Disp: 30 tablet, Rfl: 1   fluticasone  (FLONASE ) 50 MCG/ACT nasal spray, Place 2 sprays into both nostrils daily., Disp: 1 g, Rfl: 2   lidocaine -prilocaine  (EMLA ) cream, Apply to affected area once, Disp: 30 g, Rfl: 3   LORazepam  (ATIVAN ) 0.5 MG tablet, Take 1 tablet (0.5 mg total) by mouth every 8 (eight) hours as needed for anxiety., Disp: 30 tablet, Rfl: 0   Multiple Vitamin (MULTIVITAMIN WITH MINERALS) TABS tablet, Take 1 tablet by mouth daily., Disp: , Rfl:    ondansetron  (ZOFRAN ) 8 MG tablet, Take 1 tablet (8 mg total) by mouth every 8 (eight) hours as needed for nausea or vomiting. Start on the third day after chemotherapy., Disp: 30 tablet, Rfl: 1   potassium chloride  (KLOR-CON  M) 10 MEQ tablet, Take 1 tablet (10 mEq total) by mouth daily., Disp: 14 tablet, Rfl: 0   prochlorperazine  (COMPAZINE ) 10 MG tablet, Take 1 tablet (10 mg total) by mouth every 6 (six) hours as needed for nausea or vomiting., Disp: 30 tablet, Rfl: 1   simvastatin  (ZOCOR ) 10 MG tablet, Take 10 mg by mouth daily., Disp: , Rfl:    venlafaxine  XR (EFFEXOR -XR) 75 MG 24 hr capsule,  Take 75 mg by mouth daily., Disp: , Rfl:    atenolol  (TENORMIN ) 25 MG tablet, Take 25 mg by mouth daily. (Patient not taking: Reported on 10/31/2024), Disp: , Rfl:    cetirizine  (ZYRTEC ) 10 MG tablet, Take 1 tablet (10 mg total) by mouth daily. (Patient not taking: Reported on 10/31/2024), Disp: 30 tablet, Rfl: 3   traMADol  (ULTRAM ) 50 MG tablet, Take 1 tablet (50 mg total) by mouth every 8 (eight) hours as needed. (Patient not taking: Reported on 09/28/2024), Disp: 6 tablet, Rfl: 0 No current facility-administered medications for this visit.  Facility-Administered Medications Ordered in Other Visits:    0.9 %  sodium chloride  infusion, , Intravenous, Continuous, Skene Annah BROCKS, MD, Stopped at 08/29/24 1315   0.9 %  sodium chloride  infusion, , Intravenous, Continuous, Skene Annah BROCKS, MD, Last Rate: 10 mL/hr at 10/31/24 1131, New Bag at 10/31/24 1131   CARBOplatin  (PARAPLATIN ) 430 mg in sodium chloride  0.9 % 250 mL chemo infusion, 430 mg, Intravenous, Once, Skene Annah BROCKS, MD   DOCEtaxel  (TAXOTERE ) 139 mg in sodium chloride  0.9 % 250 mL chemo infusion, 75 mg/m2 (Treatment Plan Recorded), Intravenous, Once, Skene Annah BROCKS, MD, Last Rate: 264 mL/hr at 10/31/24 1257, 139 mg at 10/31/24 1257  "

## 2024-10-31 NOTE — Progress Notes (Signed)
 Nutrition Follow-up:  Patient with left breast cancer.  Receiving [facility]P chemotherapy.    Met with patient and husband during infusion.  Patient ate orange and banana for breakfast this am.  Yesterday ate baked chicken, sweet potato, green beans and carrots for lunch.  Drinking protein shake but not daily.  Denies diarrhea.  Reports taste alterations.      Medications: reviewed  Labs: reviewed  Anthropometrics:   Weight 155 lb 1.6 oz today  156 lb 11.2 oz on 12/16 162 lb 6.4 oz on 11/25 167 lb 4.8 oz on 10/14   NUTRITION DIAGNOSIS: Inadequate oral intake ongoing    INTERVENTION:  Continue protein shake, ideally daily for additional nutrition Continue foods rich in protein. Discussed importance of nutrition prior to surgery and in healing following surgery    MONITORING, EVALUATION, GOAL: weight trends, intake   NEXT VISIT: as needed  Rhylen Shaheen B. Dasie SOLON, CSO, LDN Registered Dietitian [phone]

## 2024-10-31 NOTE — Patient Instructions (Signed)
 [facility] - A DEPT OF [facility]  Discharge Instructions: Thank you for choosing [facility] to provide your oncology and hematology care.  If you have a lab appointment with the Cancer Center, please go directly to the Cancer Center and check in at the registration area.  Wear comfortable clothing and clothing appropriate for easy access to any Portacath or PICC line.   We strive to give you quality time with your provider. You may need to reschedule your appointment if you arrive late (15 or more minutes).  Arriving late affects you and other patients whose appointments are after yours.  Also, if you miss three or more appointments without notifying the office, you may be dismissed from the clinic at the providers discretion.      For prescription refill requests, have your pharmacy contact our office and allow 72 hours for refills to be completed.    Today you received the following chemotherapy and/or immunotherapy agents Phesgo , Taxotere , Carbolplatin      To help prevent nausea and vomiting after your treatment, we encourage you to take your nausea medication as directed.  BELOW ARE SYMPTOMS THAT SHOULD BE REPORTED IMMEDIATELY: *FEVER GREATER THAN 100.4 F (38 C) OR HIGHER *CHILLS OR SWEATING *NAUSEA AND VOMITING THAT IS NOT CONTROLLED WITH YOUR NAUSEA MEDICATION *UNUSUAL SHORTNESS OF BREATH *UNUSUAL BRUISING OR BLEEDING *URINARY PROBLEMS (pain or burning when urinating, or frequent urination) *BOWEL PROBLEMS (unusual diarrhea, constipation, pain near the anus) TENDERNESS IN MOUTH AND THROAT WITH OR WITHOUT PRESENCE OF ULCERS (sore throat, sores in mouth, or a toothache) UNUSUAL RASH, SWELLING OR PAIN  UNUSUAL VAGINAL DISCHARGE OR ITCHING   Items with * indicate a potential emergency and should be followed up as soon as possible or go to the Emergency Department if any problems should occur.  Please show the CHEMOTHERAPY ALERT  CARD or IMMUNOTHERAPY ALERT CARD at check-in to the Emergency Department and triage nurse.  Should you have questions after your visit or need to cancel or reschedule your appointment, please contact [facility] - A DEPT OF [facility]  [phone] and follow the prompts.  Office hours are 8:00 a.m. to 4:30 p.m. Monday - Friday. Please note that voicemails left after 4:00 p.m. may not be returned until the following business day.  We are closed weekends and major holidays. You have access to a nurse at all times for urgent questions. Please call the main number to the clinic [phone] and follow the prompts.  For any non-urgent questions, you may also contact your provider using MyChart. We now offer e-Visits for anyone 77 and older to request care online for non-urgent symptoms. For details visit [email or web address].   Also download the MyChart app! Go to the app store, search MyChart, open the app, select [place], and log in with your MyChart username and password.

## 2024-11-01 ENCOUNTER — Other Ambulatory Visit: Payer: Self-pay

## 2024-11-02 ENCOUNTER — Inpatient Hospital Stay

## 2024-11-02 DIAGNOSIS — C50412 Malignant neoplasm of upper-outer quadrant of left female breast: Secondary | ICD-10-CM

## 2024-11-02 DIAGNOSIS — Z5111 Encounter for antineoplastic chemotherapy: Secondary | ICD-10-CM | POA: Diagnosis not present

## 2024-11-02 MED ORDER — PEGFILGRASTIM-JMDB 6 MG/0.6ML ~~LOC~~ SOSY
6.0000 mg | PREFILLED_SYRINGE | Freq: Once | SUBCUTANEOUS | Status: AC
  Administered 2024-11-02: 6 mg via SUBCUTANEOUS
  Filled 2024-11-02: qty 0.6

## 2024-11-05 ENCOUNTER — Encounter: Payer: Self-pay | Admitting: Oncology

## 2024-11-05 ENCOUNTER — Other Ambulatory Visit: Payer: Self-pay

## 2024-11-05 ENCOUNTER — Emergency Department

## 2024-11-05 ENCOUNTER — Emergency Department: Admission: EM | Admit: 2024-11-05 | Discharge: 2024-11-05 | Disposition: A | Discharge status: Home / Self Care

## 2024-11-05 DIAGNOSIS — Z853 Personal history of malignant neoplasm of breast: Secondary | ICD-10-CM | POA: Diagnosis not present

## 2024-11-05 DIAGNOSIS — R1084 Generalized abdominal pain: Secondary | ICD-10-CM | POA: Diagnosis not present

## 2024-11-05 DIAGNOSIS — R319 Hematuria, unspecified: Secondary | ICD-10-CM | POA: Diagnosis present

## 2024-11-05 DIAGNOSIS — E876 Hypokalemia: Secondary | ICD-10-CM | POA: Insufficient documentation

## 2024-11-05 LAB — COMPREHENSIVE METABOLIC PANEL WITH GFR
ALT: 13 U/L (ref 0–44)
AST: 20 U/L (ref 15–41)
Albumin: 3.7 g/dL (ref 3.5–5.0)
Alkaline Phosphatase: 65 U/L (ref 38–126)
Anion gap: 10 (ref 5–15)
BUN: 12 mg/dL (ref 8–23)
CO2: 27 mmol/L (ref 22–32)
Calcium: 8.8 mg/dL — ABNORMAL LOW (ref 8.9–10.3)
Chloride: 101 mmol/L (ref 98–111)
Creatinine, Ser: 0.57 mg/dL (ref 0.44–1.00)
GFR, Estimated: >60 mL/min
Glucose, Bld: 141 mg/dL — ABNORMAL HIGH (ref 70–99)
Potassium: 3 mmol/L — ABNORMAL LOW (ref 3.5–5.1)
Sodium: 138 mmol/L (ref 135–145)
Total Bilirubin: 1 mg/dL (ref 0.0–1.2)
Total Protein: 5.4 g/dL — ABNORMAL LOW (ref 6.5–8.1)

## 2024-11-05 LAB — URINALYSIS, ROUTINE W REFLEX MICROSCOPIC
Bacteria, UA: NONE SEEN
RBC / HPF: >50 RBC/hpf (ref 0–5)
Squamous Epithelial / HPF: 0 /HPF (ref 0–5)
WBC, UA: >50 WBC/hpf (ref 0–5)

## 2024-11-05 LAB — CBC
HCT: 30.9 % — ABNORMAL LOW (ref 36.0–46.0)
Hemoglobin: 9.9 g/dL — ABNORMAL LOW (ref 12.0–15.0)
MCH: 28.9 pg (ref 26.0–34.0)
MCHC: 32 g/dL (ref 30.0–36.0)
MCV: 90.4 fL (ref 80.0–100.0)
Platelets: 93 K/uL — ABNORMAL LOW (ref 150–400)
RBC: 3.42 MIL/uL — ABNORMAL LOW (ref 3.87–5.11)
RDW: 16.1 % — ABNORMAL HIGH (ref 11.5–15.5)
WBC: 14.6 K/uL — ABNORMAL HIGH (ref 4.0–10.5)
nRBC: 0 % (ref 0.0–0.2)

## 2024-11-05 LAB — LIPASE, BLOOD: Lipase: 15 U/L (ref 11–51)

## 2024-11-05 MED ORDER — POTASSIUM CHLORIDE CRYS ER 20 MEQ PO TBCR
40.0000 meq | EXTENDED_RELEASE_TABLET | Freq: Once | ORAL | Status: AC
  Administered 2024-11-05: 40 meq via ORAL
  Filled 2024-11-05: qty 2

## 2024-11-05 MED ORDER — CEFDINIR 300 MG PO CAPS: 300.0000 mg | ORAL_CAPSULE | Freq: Two times a day (BID) | ORAL | 0 refills | Status: AC

## 2024-11-05 MED ORDER — SODIUM CHLORIDE 0.9 % IV SOLN
1.0000 g | Freq: Once | INTRAVENOUS | Status: AC
  Administered 2024-11-05: 1 g via INTRAVENOUS
  Filled 2024-11-05: qty 10

## 2024-11-05 MED ORDER — CEFDINIR 300 MG PO CAPS: 300.0000 mg | ORAL_CAPSULE | Freq: Two times a day (BID) | ORAL | 0 refills | Status: DC

## 2024-11-05 MED ORDER — IOHEXOL 300 MG/ML  SOLN
100.0000 mL | Freq: Once | INTRAMUSCULAR | Status: AC | PRN
  Administered 2024-11-05: 100 mL via INTRAVENOUS

## 2024-11-05 NOTE — Discharge Instructions (Addendum)
 You were seen today due to concern of blood in your urine.  At this time we are starting you on some antibiotics to help cover for any infection.  I would recommend following up with the urologist, please call using the phone number provided.  If you have any worsening symptoms such as difficulty urinating, confusion, fevers, or any other symptoms you find concerning please return to the emergency department immediately for further medical management.  I written for some antibiotic for you to take, please take these as instructed.

## 2024-11-05 NOTE — ED Provider Notes (Signed)
 "  [facility] Provider Note    Event Date/Time   First MD Initiated Contact with Patient 11/05/24 0940     (approximate)   History   Hematuria   HPI  Teresa [name] is a 74 y.o. female presenting with concern of hematuria.  She has underlying history of breast cancer currently receiving chemotherapy for this with her last dose being on Tuesday.  She got a shot of some medication then on Friday and husband noticed that she started having some blood in her urine.  He describes this primarily as a clot, she was able to have 1 episode of urination on Friday and then 1 on Saturday and then 1 today all times she had small clots of blood in them.  Patient states that she has some mild lower abdominal discomfort, no other complaints or symptoms.  Denying any nausea vomiting fevers chills.  Apparently having runny nose and runny eyes for over a year now but this has not acutely changed recently.  I reviewed her recent oncology note in regards to current treatment and management plan.  Does not use any blood thinners.     Physical Exam   Triage Vital Signs: ED Triage Vitals  Encounter Vitals Group     BP 11/05/24 0906 121/66     Girls Systolic BP Percentile --      Girls Diastolic BP Percentile --      Boys Systolic BP Percentile --      Boys Diastolic BP Percentile --      Pulse Rate 11/05/24 0906 (!) 112     Resp 11/05/24 0906 18     Temp 11/05/24 0906 98 F (36.7 C)     Temp src --      SpO2 11/05/24 0906 100 %     Weight 11/05/24 0905 155 lb (70.3 kg)     Height 11/05/24 0905 5' 3 (1.6 m)     Head Circumference --      Peak Flow --      Pain Score 11/05/24 0905 0     Pain Loc --      Pain Education --      Exclude from Growth Chart --     Most recent vital signs: Vitals:   11/05/24 0906 11/05/24 1325  BP: 121/66 122/64  Pulse: (!) 112 98  Resp: 18 18  Temp: 98 F (36.7 C) 98 F (36.7 C)  SpO2: 100% 100%     General: Awake, no distress.   CV:  Good peripheral perfusion.  Resp:  Normal effort.  Abd:  No distention.  Generalized abdominal discomfort Other:     ED Results / Procedures / Treatments   Labs (all labs ordered are listed, but only abnormal results are displayed) Labs Reviewed  COMPREHENSIVE METABOLIC PANEL WITH GFR - Abnormal; Notable for the following components:      Result Value   Potassium 3.0 (*)    Glucose, Bld 141 (*)    Calcium 8.8 (*)    Total Protein 5.4 (*)    All other components within normal limits  CBC - Abnormal; Notable for the following components:   WBC 14.6 (*)    RBC 3.42 (*)    Hemoglobin 9.9 (*)    HCT 30.9 (*)    RDW 16.1 (*)    Platelets 93 (*)    All other components within normal limits  URINALYSIS, ROUTINE W REFLEX MICROSCOPIC - Abnormal; Notable for the following components:  Color, Urine RED (*)    APPearance TURBID (*)    Glucose, UA   (*)    Value: TEST NOT REPORTED DUE TO COLOR INTERFERENCE OF URINE PIGMENT   Hgb urine dipstick   (*)    Value: TEST NOT REPORTED DUE TO COLOR INTERFERENCE OF URINE PIGMENT   Bilirubin Urine   (*)    Value: TEST NOT REPORTED DUE TO COLOR INTERFERENCE OF URINE PIGMENT   Ketones, ur   (*)    Value: TEST NOT REPORTED DUE TO COLOR INTERFERENCE OF URINE PIGMENT   Protein, ur   (*)    Value: TEST NOT REPORTED DUE TO COLOR INTERFERENCE OF URINE PIGMENT   Nitrite   (*)    Value: TEST NOT REPORTED DUE TO COLOR INTERFERENCE OF URINE PIGMENT   Leukocytes,Ua   (*)    Value: TEST NOT REPORTED DUE TO COLOR INTERFERENCE OF URINE PIGMENT   All other components within normal limits  LIPASE, BLOOD     EKG     RADIOLOGY   PROCEDURES:  Critical Care performed: No  Procedures   MEDICATIONS ORDERED IN ED: Medications  potassium chloride  SA (KLOR-CON  M) CR tablet 40 mEq (40 mEq Oral Given 11/05/24 1006)  iohexol  (OMNIPAQUE ) 300 MG/ML solution 100 mL (100 mLs Intravenous Contrast Given 11/05/24 1055)     IMPRESSION / MDM / ASSESSMENT  AND PLAN / ED COURSE  I reviewed the triage vital signs and the nursing notes.                               Patient's presentation is most consistent with acute complicated illness / injury requiring diagnostic workup.  74 year old female history of underlying breast cancer presenting today with concern of hematuria.  Has some mild discomfort on exam of the lower abdomen.  She appears well she is not in any acute distress while at rest, initially slightly tachycardic we will continue to monitor this, she also has a degree of hypokalemia so we will go ahead and replete this.  Will follow-up urinalysis, given her underlying known cancer history and now the hematuria we will go ahead and obtain CT imaging and determine further workup accordingly.   Clinical Course as of 11/05/24 1532  Sun Nov 05, 2024  1029 Bladder scan with 0 in the bladder, pending imaging at this time. [SK]  1430 Patient husband mentions that she has had some dark stool recently, Hemoccult was performed, patient is Hemoccult negative here with yellowish-brown stool noted. [SK]  1506 S/o from Dr. Fernand 14F hx breast ca - largely painless hematuria for a few days, gross hematuria on exam - CT reassuring  [MM]  1531 Indeterminate urine.  Given the findings we will start the patient empirically on antibiotics, will recommend outpatient follow-up with urology as well as discussion with her oncologist.  Discussed return precautions will have her discharge at this time. [SK]    Clinical Course User Index [MM] Clarine Ozell LABOR, MD [SK] Fernand Rossie HERO, MD     FINAL CLINICAL IMPRESSION(S) / ED DIAGNOSES   Final diagnoses:  Hematuria, unspecified type  Hypokalemia     Rx / DC Orders   ED Discharge Orders     None        Note:  This document was prepared using Dragon voice recognition software and may include unintentional dictation errors.   Fernand Rossie HERO, MD 11/05/24 1545  "

## 2024-11-05 NOTE — ED Triage Notes (Signed)
 Pt comes with c/o hematuria. Pt states some pain last night in her belly. Pt states it isn't as bad today. Pt denies any thinners. Pt is cancer pt last chem last week.

## 2024-11-08 ENCOUNTER — Ambulatory Visit: Payer: Self-pay | Admitting: Surgery

## 2024-11-08 DIAGNOSIS — D696 Thrombocytopenia, unspecified: Secondary | ICD-10-CM

## 2024-11-08 NOTE — H&P (Signed)
 Subjective:   CC: Malignant neoplasm of upper-outer quadrant of left breast in female, estrogen receptor positive (CMS/HHS-HCC) [C50.412, Z17.0] HPI: Return for evaluation of above. S/p neoadjuvant chemo.  Ready to discuss surgery. History of Present Illness    Past Medical History:  has a past medical history of Allergic state, Diabetes mellitus without complication (CMS/HHS-HCC), and Hyperlipidemia.  Past Surgical History:  has a past surgical history that includes complex repair back; Small Intestine Endoscopy; and Hysteroscopy Uterus Unlisted (Right).  Family History: family history includes No Known Problems in her father and mother.  Social History:  reports that she has never smoked. She has never used smokeless tobacco. She reports current alcohol use. She reports that she does not use drugs.  Current Medications: has a current medication list which includes the following prescription(s): acetaminophen , cetirizine , docusate, lidocaine -prilocaine , multivitamin, ondansetron , prochlorperazine , simvastatin , and venlafaxine .  Allergies:  Allergies as of 11/08/2024 - Reviewed 11/08/2024  Allergen Reaction Noted   Aspirin Other (See Comments) 04/22/2015   Naproxen sodium Other (See Comments) 04/22/2015    ROS:  A 15 point review of systems was performed and was negative except as noted in HPI   Objective:     BP 106/64   Pulse 103   Ht 162.6 cm (5' 4)   Wt 70.3 kg (155 lb)   BMI 26.61 kg/m   Constitutional :  No distress, cooperative, alert  Lymphatics/Throat:  Supple with no lymphadenopathy  Respiratory:  Clear to auscultation bilaterally  Cardiovascular:  Regular rate and rhythm  Gastrointestinal: Soft, non-tender, non-distended, no organomegaly.  Musculoskeletal: Steady gait and movement  Skin: Cool and moist  Psychiatric: Normal affect, non-agitated, not confused  Breast: Normal appearance and no palpable abnormality in bilateral breasts and axilla.  Chaperone  present for exam.      LABS:  SURGICAL PATHOLOGY  [facility]  [address]  [place], [place] [address]  Telephone [phone] or [phone] Fax [phone]   REPORT OF SURGICAL PATHOLOGY    Accession #: [id number]  Patient Name: Teresa [name], Teresa [name]  Visit # : [id number]   MRN: [id number]  Physician: Correne Krabbe  DOB/Age February 01, 1951 (Age: 74) Gender: F  Collected Date: 06/20/2024  Received Date: 06/20/2024   FINAL DIAGNOSIS        1. Breast, left, needle core biopsy, 2 o'clock, 7cmfn, ribbon :       - INVASIVE DUCTAL CARCINOMA, SEE NOTE       - DUCTAL CARCINOMA IN SITU, SOLID AND CRIBRIFORM TYPES, HIGH NUCLEAR GRADE, WITH       NECROSIS       - TUBULE FORMATION: SCORE 3       - NUCLEAR PLEOMORPHISM: SCORE 3       - MITOTIC COUNT: SCORE 3       - TOTAL SCORE: 9       - OVERALL GRADE: 3       - LYMPHOVASCULAR INVASION: NOT IDENTIFIED       - CANCER LENGTH: 16 MM       - CALCIFICATIONS: PRESENT       - OTHER FINDINGS: NOT IDENTIFIED        2. Breast, left, needle core biopsy, 2 o'clock, 3cmfn, coil :       - INVASIVE DUCTAL CARCINOMA, SEE NOTE       - DUCTAL CARCINOMA IN SITU, SOLID, CRIBRIFORM AND MICROPAPILLARY TYPES, HIGH       NUCLEAR GRADE, WITH NECROSIS       -  TUBULE FORMATION: SCORE 3       - NUCLEAR PLEOMORPHISM: SCORE 3       - MITOTIC COUNT: SCORE 1       - TOTAL SCORE: 7       - OVERALL GRADE: 2       - LYMPHOVASCULAR INVASION: NOT IDENTIFIED       - CANCER LENGTH: 6 MM       - CALCIFICATIONS: PRESENT       - OTHER FINDINGS: NOT IDENTIFIED        Diagnosis Note : These results were communicated to Rock Hover, RN in the       [facility] on 06/21/2024.       ER, PR, and HER2 will be performed on block 1A and reported in an addendum.       This case underwent intradepartmental consultation and Dr. Janel concurs with       the interpretation.       ELECTRONIC SIGNATURE : Coronel Md, Misti,  Sports Administrator, International Aid/development Worker   MICROSCOPIC DESCRIPTION   CASE COMMENTS  STAINS USED IN DIAGNOSIS:  H&E-2  H&E-3  H&E-4  H&E  *RECUT 1 SLIDE  H&E-2  H&E-3  H&E-4  H&E  Stains used in diagnosis 1 Her2 by IHC, 1 ER-ACIS, 1 PR-ACIS  IHC scores are reported using ASCO/CAP scoring criteria.  An IHC Score of 0 or  1+  is NEGATIVE for HER2, 3+ is POSITIVE for HER2, and 2+ is EQUIVOCAL.  Equivocal results are reflexed to either FISH or IHC testing. Specimens are  fixed in 10% Neutral Buffered Formalin for at least 6 hours and up to 72 hours.  These tests have not be validated on decalcified tissue.  Results should be  interpreted with caution given the possibility of false negative results on  decalcified specimens. Antibody Clone for HER2 is 4B5 (PATHWAY). Some of these  immunohistochemical stains may have been developed and the performance  characteristics determined by [facility].  Some may not have been  cleared or approved by the U.S. Food and Drug Administration.  The FDA has  determined that such clearance or approval is not necessary.  This test is used  for clinical purposes.  It should not be regarded as investigational or for  research.  This laboratory is certified under the Clinical Laboratory  Improvement Amendments of 1988 (CLIA-88) as qualified to perform high complexity  clinical laboratory testing.  Estrogen receptor (6F11), immunohistochemical stains are performed on formalin  fixed, paraffin embedded tissue using a 3,3-diaminobenzidine (DAB) chromogen  and Leica Bond Autostainer System.  The staining intensity of the nucleus is  scored manually and is reported as the percentage of tumor cell nuclei  demonstrating specific nuclear staining.Specimens are fixed in 10% Neutral  Buffered Formalin for at least 6 hours and up to 72 hours.  These tests have not  be validated on decalcified tissue.  Results should be interpreted with caution  given the  possibility of false negative results on decalcified specimens.  PR progesterone receptor (16), immunohistochemical stains are performed on  formalin fixed, paraffin embedded tissue using a 3,3-diaminobenzidine (DAB)  chromogen and Leica Bond Autostainer System.  The staining intensity of the  nucleus is scored manually and is reported as the percentage of tumor cell  nuclei demonstrating specific nuclear staining.Specimens are fixed in 10%  Neutral Buffered Formalin for at least 6 hours and up to 72 hours. These tests  have not be validated on  decalcified tissue.  Results should be interpreted with  caution given the possibility of false negative results on decalcified  specimens.   ADDENDUM  1) Breast, left needle core biopsy, 2 o'clock, 7 cmfn, ribbon  PROGNOSTIC INDICATORS   Results:  IMMUNOHISTOCHEMICAL AND MORPHOMETRIC ANALYSIS PERFORMED MANUALLY  The tumor cells are POSITIVE for Her2 (3+).  Estrogen Receptor:  0%, NEGATIVE  Progesterone Receptor:  0%, NEGATIVE  COMMENT:  The negative hormone receptor study(ies) in this case has an internal positive control.   REFERENCE RANGE ESTROGEN RECEPTOR  NEGATIVE     0%  POSITIVE       =>1%  REFERENCE RANGE PROGESTERONE RECEPTOR  NEGATIVE     0%  POSITIVE        =>1%  All controls stained appropriately  Belvie Come, John, Pathologist, Electronic Signature  ( Signed [phone])    CLINICAL HISTORY   SPECIMEN(S) OBTAINED  1. Breast, left, needle core biopsy, 2 O'clock, 7cmfn, Ribbon  2. Breast, left, needle core biopsy, 2 O'clock, 3cmfn, Coil   SPECIMEN COMMENTS:  1. TIF: 8:22 AM, CIT less than 30 sec; palpable left breast masses and  calcifications in a segmental distribution  2. TIF: 8:27 AM, CIT less than 30 sec  SPECIMEN CLINICAL INFORMATION:  1. Malignancy  2. Malignancy     Gross Description  1. Received in formalin labeled with the patient's name and left breast 2  o'clock 7 cm fn are four cores of tan fibrofatty  tissue ranging from 1.1 to 1.7  cm in length x 0.2 cm in diameter.The specimen is entirely submitted in 1A.       Time in formalin is 8:22 a.m. on 06/20/24.  CIT is less than 30 seconds.  2. Received in formalin labeled with the patient's name and left breast 2  o'clock 3 cm fn are four cores of tan-pink fibrofatty tissue ranging from 1.3 to  1.8 cm in length x 0.2 cm in diameter.The specimen is entirely submitted in 2A.       Time in formalin is 8:27 a.m. on 06/20/24.  CIT is less than 30 seconds.  (WC:kh       06/20/24)         Report signed out from the following location(s)  [facility]  [address] [place], [place] [address] CLIA #: [id number]   [facility]  [address] [place], [place] [address] CLIA #: [id number]    RADS: CLINICAL DATA:  Status post 2 site ultrasound-guided biopsy   EXAM:  3D DIAGNOSTIC LEFT MAMMOGRAM POST ULTRASOUND BIOPSY   COMPARISON:  Previous exam(s).   ACR Breast Density Category b: There are scattered areas of  fibroglandular density.   FINDINGS:  3D Mammographic images were obtained following ultrasound guided  biopsy of a mass at 2 o'clock 7 cm from nipple. The RIBBON biopsy  marking clip is in expected position at the site of biopsy. This is  at the superior posterior site of segmental calcifications.   3D Mammographic images were obtained following ultrasound guided  biopsy of a mass at 2 o'clock 3 cm from the nipple. The COIL biopsy  marking clip is in expected position at the site of biopsy. This is  at the anterior site of the segmental calcifications.   Biopsy clips span approximately 5.5 cm.   IMPRESSION:  1. Appropriate positioning of the RIBBON shaped biopsy marking clip  at the site of biopsy in the upper outer breast  at posterior depth.  2. Appropriate positioning of the COIL shaped biopsy marking clip at  the site of biopsy in the upper outer breast at anterior depth.  3. Biopsy  clips are approximately 5.5 cm apart.   Final Assessment: Post Procedure Mammograms for Marker Placement    Electronically Signed    By: Corean Salter M.D.    On: 06/20/2024 08:59  CLINICAL DATA:  74 year old female presents with LEFT breast mass  identified on recent CT. Also for annual bilateral mammogram.   EXAM:  DIGITAL DIAGNOSTIC BILATERAL MAMMOGRAM WITH TOMOSYNTHESIS AND CAD;  ULTRASOUND LEFT BREAST LIMITED   TECHNIQUE:  Bilateral digital diagnostic mammography and breast tomosynthesis  was performed. The images were evaluated with computer-aided  detection. ; Targeted ultrasound examination of the left breast was  performed.   COMPARISON: Previous exam(s).   ACR Breast Density Category b: There are scattered areas of  fibroglandular density.   FINDINGS:  Full field views of both breasts and magnification and spot  compression views of the LEFT breast are performed.   An 8 cm group of pleomorphic calcifications is identified within the  UPPER OUTER LEFT breast. A 4.5 cm irregular mass identified with the  posterior aspect of these calcifications and a 2.3 cm masses  associated with the anterior aspect of these calcifications.   No other new or suspicious mammographic findings within either  breast identified.   Targeted ultrasound is performed, showing the following:   A 3.9 x 2.4 x 4.5 cm irregular hypoechoic mass containing  calcifications is centered at the 1-2 o'clock position of the LEFT  breast 5 cm from the nipple.   A 2.3 x 0.9 x 1 cm heterogeneous hypoechoic mass containing  calcifications is centered at the 2 o'clock position 3 cm from the  nipple.   These 2 masses span a distance of at least 6.8 cm and have a thin  linear hypoechoic connection.   No abnormal appearing LEFT axillary lymph nodes are noted.   IMPRESSION:  1. 8 cm group of highly suspicious UPPER-OUTER LEFT breast  calcifications with 4.5 cm mass identified sonographically  along the  posterior aspect of the calcifications and a 2.3 cm mass identified  sonographically along the anterior aspect of these calcifications.  Tissue sampling of the posterior aspect of the 1-2 o'clock position  mass 5 cm from the nipple in the anterior aspect of the 2.3 cm 2  o'clock position mass 3 cm from the nipple is recommended.  2. No abnormal appearing LEFT axillary lymph nodes.  3. No suspicious mammographic findings within the RIGHT breast.   RECOMMENDATION:  Ultrasound-guided biopsy of posterior aspect of 4.5 cm 1-2 o'clock  position LEFT breast mass 5 cm from the nipple and ultrasound-guided  biopsy of anterior aspect of 2.3 cm 2 o'clock position LEFT breast  mass 3 cm from the nipple.   I have discussed the findings and recommendations with the patient.  If applicable, a reminder letter will be sent to the patient  regarding the next appointment.   BI-RADS CATEGORY  5: Highly suggestive of malignancy.    Electronically Signed    By: Reyes Phi M.D.    On: 06/12/2024 14:05   Assessment:   Malignant neoplasm of upper-outer quadrant of left breast in female, estrogen receptor positive (CMS/HHS-HCC) [C50.412, Z17.0]  After extensive discussion of possibilities of lumpectomy, left mastectomy, bilateral mastectomy, will proceed with double mastectomy and LEFT SLNB to eliminate need for mammograms in the future, and  also due to known RAD51C mutation, understanding bilateral is not routinely recommended just for the mutation.  Plan:     1. Malignant neoplasm of upper-outer quadrant of left breast in female, estrogen receptor positive (CMS/HHS-HCC) [C50.412, Z17.0]  Discussed the risk of surgery including recurrence, chronic pain, post-op infxn, poor/delayed wound healing, poor cosmesis, seroma, hematoma formation, and possible re-operation to address said risks. The risks of general anesthetic, if used, includes MI, CVA, sudden death or even reaction to anesthetic  medications also discussed.  Typical post-op recovery time and possbility of activity restrictions were also discussed.  Alternatives include continued observation.  Benefits include possible symptom relief, pathologic evaluation, and/or curative excision.   The patient verbalized understanding and all questions were answered to the patient's satisfaction.  2. Patient has elected to proceed with surgical treatment. Procedure will be scheduled.  BILATERAL mastectomy, LEFT sentinel lymph node biopsy, NO PORT REMOVAL DUE TO NEED FOR ADJUVANT HER-2 TREATMENT.  labs/images/medications/previous chart entries reviewed personally and relevant changes/updates noted above.

## 2024-11-08 NOTE — H&P (View-Only) (Signed)
 Subjective:   CC: Malignant neoplasm of upper-outer quadrant of left breast in female, estrogen receptor positive (CMS/HHS-HCC) [C50.412, Z17.0] HPI: Return for evaluation of above. S/p neoadjuvant chemo.  Ready to discuss surgery. History of Present Illness    Past Medical History:  has a past medical history of Allergic state, Diabetes mellitus without complication (CMS/HHS-HCC), and Hyperlipidemia.  Past Surgical History:  has a past surgical history that includes complex repair back; Small Intestine Endoscopy; and Hysteroscopy Uterus Unlisted (Right).  Family History: family history includes No Known Problems in her father and mother.  Social History:  reports that she has never smoked. She has never used smokeless tobacco. She reports current alcohol use. She reports that she does not use drugs.  Current Medications: has a current medication list which includes the following prescription(s): acetaminophen , cetirizine , docusate, lidocaine -prilocaine , multivitamin, ondansetron , prochlorperazine , simvastatin , and venlafaxine .  Allergies:  Allergies as of 11/08/2024 - Reviewed 11/08/2024  Allergen Reaction Noted   Aspirin Other (See Comments) 04/22/2015   Naproxen sodium Other (See Comments) 04/22/2015    ROS:  A 15 point review of systems was performed and was negative except as noted in HPI   Objective:     BP 106/64   Pulse 103   Ht 162.6 cm (5' 4)   Wt 70.3 kg (155 lb)   BMI 26.61 kg/m   Constitutional :  No distress, cooperative, alert  Lymphatics/Throat:  Supple with no lymphadenopathy  Respiratory:  Clear to auscultation bilaterally  Cardiovascular:  Regular rate and rhythm  Gastrointestinal: Soft, non-tender, non-distended, no organomegaly.  Musculoskeletal: Steady gait and movement  Skin: Cool and moist  Psychiatric: Normal affect, non-agitated, not confused  Breast: Normal appearance and no palpable abnormality in bilateral breasts and axilla.  Chaperone  present for exam.      LABS:  SURGICAL PATHOLOGY  [facility]  [address]  [place], [place] [address]  Telephone [phone] or [phone] Fax [phone]   REPORT OF SURGICAL PATHOLOGY    Accession #: [id number]  Patient Name: Teresa, [name]  Visit # : [id number]   MRN: [id number]  Physician: Correne Krabbe  DOB/Age February 01, 1951 (Age: 74) Gender: F  Collected Date: 06/20/2024  Received Date: 06/20/2024   FINAL DIAGNOSIS        1. Breast, left, needle core biopsy, 2 o'clock, 7cmfn, ribbon :       - INVASIVE DUCTAL CARCINOMA, SEE NOTE       - DUCTAL CARCINOMA IN SITU, SOLID AND CRIBRIFORM TYPES, HIGH NUCLEAR GRADE, WITH       NECROSIS       - TUBULE FORMATION: SCORE 3       - NUCLEAR PLEOMORPHISM: SCORE 3       - MITOTIC COUNT: SCORE 3       - TOTAL SCORE: 9       - OVERALL GRADE: 3       - LYMPHOVASCULAR INVASION: NOT IDENTIFIED       - CANCER LENGTH: 16 MM       - CALCIFICATIONS: PRESENT       - OTHER FINDINGS: NOT IDENTIFIED        2. Breast, left, needle core biopsy, 2 o'clock, 3cmfn, coil :       - INVASIVE DUCTAL CARCINOMA, SEE NOTE       - DUCTAL CARCINOMA IN SITU, SOLID, CRIBRIFORM AND MICROPAPILLARY TYPES, HIGH       NUCLEAR GRADE, WITH NECROSIS       -  TUBULE FORMATION: SCORE 3       - NUCLEAR PLEOMORPHISM: SCORE 3       - MITOTIC COUNT: SCORE 1       - TOTAL SCORE: 7       - OVERALL GRADE: 2       - LYMPHOVASCULAR INVASION: NOT IDENTIFIED       - CANCER LENGTH: 6 MM       - CALCIFICATIONS: PRESENT       - OTHER FINDINGS: NOT IDENTIFIED        Diagnosis Note : These results were communicated to Rock Hover, RN in the       [facility] on 06/21/2024.       ER, PR, and HER2 will be performed on block 1A and reported in an addendum.       This case underwent intradepartmental consultation and Dr. Janel concurs with       the interpretation.       ELECTRONIC SIGNATURE : Coronel Md, Misti,  Sports Administrator, International Aid/development Worker   MICROSCOPIC DESCRIPTION   CASE COMMENTS  STAINS USED IN DIAGNOSIS:  H&E-2  H&E-3  H&E-4  H&E  *RECUT 1 SLIDE  H&E-2  H&E-3  H&E-4  H&E  Stains used in diagnosis 1 Her2 by IHC, 1 ER-ACIS, 1 PR-ACIS  IHC scores are reported using ASCO/CAP scoring criteria.  An IHC Score of 0 or  1+  is NEGATIVE for HER2, 3+ is POSITIVE for HER2, and 2+ is EQUIVOCAL.  Equivocal results are reflexed to either FISH or IHC testing. Specimens are  fixed in 10% Neutral Buffered Formalin for at least 6 hours and up to 72 hours.  These tests have not be validated on decalcified tissue.  Results should be  interpreted with caution given the possibility of false negative results on  decalcified specimens. Antibody Clone for HER2 is 4B5 (PATHWAY). Some of these  immunohistochemical stains may have been developed and the performance  characteristics determined by [facility].  Some may not have been  cleared or approved by the U.S. Food and Drug Administration.  The FDA has  determined that such clearance or approval is not necessary.  This test is used  for clinical purposes.  It should not be regarded as investigational or for  research.  This laboratory is certified under the Clinical Laboratory  Improvement Amendments of 1988 (CLIA-88) as qualified to perform high complexity  clinical laboratory testing.  Estrogen receptor (6F11), immunohistochemical stains are performed on formalin  fixed, paraffin embedded tissue using a 3,3-diaminobenzidine (DAB) chromogen  and Leica Bond Autostainer System.  The staining intensity of the nucleus is  scored manually and is reported as the percentage of tumor cell nuclei  demonstrating specific nuclear staining.Specimens are fixed in 10% Neutral  Buffered Formalin for at least 6 hours and up to 72 hours.  These tests have not  be validated on decalcified tissue.  Results should be interpreted with caution  given the  possibility of false negative results on decalcified specimens.  PR progesterone receptor (16), immunohistochemical stains are performed on  formalin fixed, paraffin embedded tissue using a 3,3-diaminobenzidine (DAB)  chromogen and Leica Bond Autostainer System.  The staining intensity of the  nucleus is scored manually and is reported as the percentage of tumor cell  nuclei demonstrating specific nuclear staining.Specimens are fixed in 10%  Neutral Buffered Formalin for at least 6 hours and up to 72 hours. These tests  have not be validated on  decalcified tissue.  Results should be interpreted with  caution given the possibility of false negative results on decalcified  specimens.   ADDENDUM  1) Breast, left needle core biopsy, 2 o'clock, 7 cmfn, ribbon  PROGNOSTIC INDICATORS   Results:  IMMUNOHISTOCHEMICAL AND MORPHOMETRIC ANALYSIS PERFORMED MANUALLY  The tumor cells are POSITIVE for Her2 (3+).  Estrogen Receptor:  0%, NEGATIVE  Progesterone Receptor:  0%, NEGATIVE  COMMENT:  The negative hormone receptor study(ies) in this case has an internal positive control.   REFERENCE RANGE ESTROGEN RECEPTOR  NEGATIVE     0%  POSITIVE       =>1%  REFERENCE RANGE PROGESTERONE RECEPTOR  NEGATIVE     0%  POSITIVE        =>1%  All controls stained appropriately  Belvie Come, John, Pathologist, Electronic Signature  ( Signed [phone])    CLINICAL HISTORY   SPECIMEN(S) OBTAINED  1. Breast, left, needle core biopsy, 2 O'clock, 7cmfn, Ribbon  2. Breast, left, needle core biopsy, 2 O'clock, 3cmfn, Coil   SPECIMEN COMMENTS:  1. TIF: 8:22 AM, CIT less than 30 sec; palpable left breast masses and  calcifications in a segmental distribution  2. TIF: 8:27 AM, CIT less than 30 sec  SPECIMEN CLINICAL INFORMATION:  1. Malignancy  2. Malignancy     Gross Description  1. Received in formalin labeled with the patient's name and left breast 2  o'clock 7 cm fn are four cores of tan fibrofatty  tissue ranging from 1.1 to 1.7  cm in length x 0.2 cm in diameter.The specimen is entirely submitted in 1A.       Time in formalin is 8:22 a.m. on 06/20/24.  CIT is less than 30 seconds.  2. Received in formalin labeled with the patient's name and left breast 2  o'clock 3 cm fn are four cores of tan-pink fibrofatty tissue ranging from 1.3 to  1.8 cm in length x 0.2 cm in diameter.The specimen is entirely submitted in 2A.       Time in formalin is 8:27 a.m. on 06/20/24.  CIT is less than 30 seconds.  (WC:kh       06/20/24)         Report signed out from the following location(s)  [facility]  [address] [place], [place] [address] CLIA #: [id number]   [facility]  [address] [place], [place] [address] CLIA #: [id number]    RADS: CLINICAL DATA:  Status post 2 site ultrasound-guided biopsy   EXAM:  3D DIAGNOSTIC LEFT MAMMOGRAM POST ULTRASOUND BIOPSY   COMPARISON:  Previous exam(s).   ACR Breast Density Category b: There are scattered areas of  fibroglandular density.   FINDINGS:  3D Mammographic images were obtained following ultrasound guided  biopsy of a mass at 2 o'clock 7 cm from nipple. The RIBBON biopsy  marking clip is in expected position at the site of biopsy. This is  at the superior posterior site of segmental calcifications.   3D Mammographic images were obtained following ultrasound guided  biopsy of a mass at 2 o'clock 3 cm from the nipple. The COIL biopsy  marking clip is in expected position at the site of biopsy. This is  at the anterior site of the segmental calcifications.   Biopsy clips span approximately 5.5 cm.   IMPRESSION:  1. Appropriate positioning of the RIBBON shaped biopsy marking clip  at the site of biopsy in the upper outer breast  at posterior depth.  2. Appropriate positioning of the COIL shaped biopsy marking clip at  the site of biopsy in the upper outer breast at anterior depth.  3. Biopsy  clips are approximately 5.5 cm apart.   Final Assessment: Post Procedure Mammograms for Marker Placement    Electronically Signed    By: Corean Salter M.D.    On: 06/20/2024 08:59  CLINICAL DATA:  74 year old female presents with LEFT breast mass  identified on recent CT. Also for annual bilateral mammogram.   EXAM:  DIGITAL DIAGNOSTIC BILATERAL MAMMOGRAM WITH TOMOSYNTHESIS AND CAD;  ULTRASOUND LEFT BREAST LIMITED   TECHNIQUE:  Bilateral digital diagnostic mammography and breast tomosynthesis  was performed. The images were evaluated with computer-aided  detection. ; Targeted ultrasound examination of the left breast was  performed.   COMPARISON: Previous exam(s).   ACR Breast Density Category b: There are scattered areas of  fibroglandular density.   FINDINGS:  Full field views of both breasts and magnification and spot  compression views of the LEFT breast are performed.   An 8 cm group of pleomorphic calcifications is identified within the  UPPER OUTER LEFT breast. A 4.5 cm irregular mass identified with the  posterior aspect of these calcifications and a 2.3 cm masses  associated with the anterior aspect of these calcifications.   No other new or suspicious mammographic findings within either  breast identified.   Targeted ultrasound is performed, showing the following:   A 3.9 x 2.4 x 4.5 cm irregular hypoechoic mass containing  calcifications is centered at the 1-2 o'clock position of the LEFT  breast 5 cm from the nipple.   A 2.3 x 0.9 x 1 cm heterogeneous hypoechoic mass containing  calcifications is centered at the 2 o'clock position 3 cm from the  nipple.   These 2 masses span a distance of at least 6.8 cm and have a thin  linear hypoechoic connection.   No abnormal appearing LEFT axillary lymph nodes are noted.   IMPRESSION:  1. 8 cm group of highly suspicious UPPER-OUTER LEFT breast  calcifications with 4.5 cm mass identified sonographically  along the  posterior aspect of the calcifications and a 2.3 cm mass identified  sonographically along the anterior aspect of these calcifications.  Tissue sampling of the posterior aspect of the 1-2 o'clock position  mass 5 cm from the nipple in the anterior aspect of the 2.3 cm 2  o'clock position mass 3 cm from the nipple is recommended.  2. No abnormal appearing LEFT axillary lymph nodes.  3. No suspicious mammographic findings within the RIGHT breast.   RECOMMENDATION:  Ultrasound-guided biopsy of posterior aspect of 4.5 cm 1-2 o'clock  position LEFT breast mass 5 cm from the nipple and ultrasound-guided  biopsy of anterior aspect of 2.3 cm 2 o'clock position LEFT breast  mass 3 cm from the nipple.   I have discussed the findings and recommendations with the patient.  If applicable, a reminder letter will be sent to the patient  regarding the next appointment.   BI-RADS CATEGORY  5: Highly suggestive of malignancy.    Electronically Signed    By: Reyes Phi M.D.    On: 06/12/2024 14:05   Assessment:   Malignant neoplasm of upper-outer quadrant of left breast in female, estrogen receptor positive (CMS/HHS-HCC) [C50.412, Z17.0]  After extensive discussion of possibilities of lumpectomy, left mastectomy, bilateral mastectomy, will proceed with double mastectomy and LEFT SLNB to eliminate need for mammograms in the future, and  also due to known RAD51C mutation, understanding bilateral is not routinely recommended just for the mutation.  Plan:     1. Malignant neoplasm of upper-outer quadrant of left breast in female, estrogen receptor positive (CMS/HHS-HCC) [C50.412, Z17.0]  Discussed the risk of surgery including recurrence, chronic pain, post-op infxn, poor/delayed wound healing, poor cosmesis, seroma, hematoma formation, and possible re-operation to address said risks. The risks of general anesthetic, if used, includes MI, CVA, sudden death or even reaction to anesthetic  medications also discussed.  Typical post-op recovery time and possbility of activity restrictions were also discussed.  Alternatives include continued observation.  Benefits include possible symptom relief, pathologic evaluation, and/or curative excision.   The patient verbalized understanding and all questions were answered to the patient's satisfaction.  2. Patient has elected to proceed with surgical treatment. Procedure will be scheduled.  BILATERAL mastectomy, LEFT sentinel lymph node biopsy, NO PORT REMOVAL DUE TO NEED FOR ADJUVANT HER-2 TREATMENT.  labs/images/medications/previous chart entries reviewed personally and relevant changes/updates noted above.

## 2024-11-14 ENCOUNTER — Other Ambulatory Visit: Payer: Self-pay | Admitting: Surgery

## 2024-11-14 DIAGNOSIS — C50412 Malignant neoplasm of upper-outer quadrant of left female breast: Secondary | ICD-10-CM

## 2024-11-17 ENCOUNTER — Other Ambulatory Visit: Payer: Self-pay

## 2024-11-17 ENCOUNTER — Encounter: Payer: Self-pay | Admitting: Oncology

## 2024-11-17 ENCOUNTER — Encounter
Admission: RE | Admit: 2024-11-17 | Discharge: 2024-11-17 | Disposition: A | Source: Ambulatory Visit | Attending: Surgery

## 2024-11-17 VITALS — Ht 63.0 in | Wt 155.0 lb

## 2024-11-17 DIAGNOSIS — E1142 Type 2 diabetes mellitus with diabetic polyneuropathy: Secondary | ICD-10-CM

## 2024-11-17 DIAGNOSIS — Z01812 Encounter for preprocedural laboratory examination: Secondary | ICD-10-CM

## 2024-11-17 NOTE — Patient Instructions (Addendum)
 Your procedure is scheduled on:   THURSDAY JANUARY 29  Report to the Registration Desk on the 1st floor of the [facility] To find out your arrival time, please call [phone] between 1PM - 3PM on:  [facility]  JANUARY 28  If your arrival time is 6:00 am, do not arrive before that time as the [facility] entrance doors do not open until 6:00 am.  REMEMBER: Instructions that are not followed completely may result in serious medical risk, up to and including death; or upon the discretion of your surgeon and anesthesiologist your surgery may need to be rescheduled.  Do not eat food after midnight the night before surgery.  No gum chewing or hard candies.  You may however, drink WATER up to 2 hours before you are scheduled to arrive for your surgery. Do not drink anything within 2 hours of your scheduled arrival time.  One week prior to surgery: Stop Anti-inflammatories (NSAIDS) such as Advil , Aleve, Ibuprofen , Motrin , Naproxen, Naprosyn and Aspirin based products such as Excedrin, Goody's Powder, BC Powder. Stop ANY OVER THE COUNTER supplements until after surgery. azelastine  (ASTELIN )  cetirizine  (ZYRTEC )  fluticasone  (FLONASE )  Multiple Vitamin (MULTIVITAMIN WITH MINERALS)   You may however, continue to take Tylenol  if needed for pain up until the day of surgery.  Continue taking all of your other prescription medications up until the day of surgery.  ON THE DAY OF SURGERY ONLY TAKE THESE MEDICATIONS WITH SIPS OF WATER:  venlafaxine  XR (EFFEXOR -XR)   No Alcohol for 24 hours before or after surgery.  Do not use any recreational drugs for at least a week (preferably 2 weeks) before your surgery.  Please be advised that the combination of cocaine and anesthesia may have negative outcomes, up to and including death. If you test positive for cocaine, your surgery will be cancelled.  On the morning of surgery brush your teeth with toothpaste and water, you may rinse your mouth  with mouthwash if you wish. Do not swallow any toothpaste or mouthwash.  Use antibacterial soap (e.g. Dial) the night before your surgery.   Do not wear jewelry, make-up, hairpins, clips or nail polish.  For welded (permanent) jewelry: bracelets, anklets, waist bands, etc.  Please have this removed prior to surgery.  If it is not removed, there is a chance that hospital personnel will need to cut it off on the day of surgery.  Do not wear lotions, powders, or perfumes.   Do not shave body hair from the neck down 48 hours before surgery.  Contact lenses, hearing aids and dentures may not be worn into surgery.  Do not bring valuables to the hospital. [facility] is not responsible for any missing/lost belongings or valuables.   Notify your doctor if there is any change in your medical condition (cold, fever, infection).  Wear comfortable clothing (specific to your surgery type) to the hospital.  After surgery, you can help prevent lung complications by doing breathing exercises.  Take deep breaths and cough every 1-2 hours.   If you are being discharged the day of surgery, you will not be allowed to drive home. You will need a responsible individual to drive you home and stay with you for 24 hours after surgery.   If you are taking public transportation, you will need to have a responsible individual with you.  Please call the Pre-admissions Testing Dept. at [phone] if you have any questions about these instructions.  Surgery Visitation Policy:  Patients having  surgery or a procedure may have two visitors.  Children under the age of 24 must have an adult with them who is not the patient.  Merchandiser, Retail to address health-related social needs:  [email or web address]

## 2024-11-23 ENCOUNTER — Ambulatory Visit
Admission: RE | Admit: 2024-11-23 | Discharge: 2024-11-23 | Disposition: A | Discharge status: Home / Self Care | Source: Ambulatory Visit | Attending: Surgery | Admitting: Surgery

## 2024-11-23 ENCOUNTER — Encounter: Payer: Self-pay | Admitting: Urgent Care

## 2024-11-23 ENCOUNTER — Encounter: Payer: Self-pay | Admitting: Surgery

## 2024-11-23 ENCOUNTER — Encounter
Admission: RE | Disposition: A | Discharge status: Home / Self Care | Payer: Self-pay | Source: Home / Self Care | Attending: Surgery

## 2024-11-23 ENCOUNTER — Other Ambulatory Visit: Payer: Self-pay

## 2024-11-23 ENCOUNTER — Observation Stay
Admission: RE | Admit: 2024-11-23 | Discharge: 2024-11-24 | Disposition: A | Discharge status: Home / Self Care | Attending: Surgery | Admitting: Surgery

## 2024-11-23 DIAGNOSIS — Z170 Estrogen receptor positive status [ER+]: Secondary | ICD-10-CM | POA: Diagnosis not present

## 2024-11-23 DIAGNOSIS — E1142 Type 2 diabetes mellitus with diabetic polyneuropathy: Secondary | ICD-10-CM | POA: Diagnosis not present

## 2024-11-23 DIAGNOSIS — C50919 Malignant neoplasm of unspecified site of unspecified female breast: Principal | ICD-10-CM | POA: Diagnosis present

## 2024-11-23 DIAGNOSIS — C50412 Malignant neoplasm of upper-outer quadrant of left female breast: Secondary | ICD-10-CM

## 2024-11-23 DIAGNOSIS — Z01812 Encounter for preprocedural laboratory examination: Secondary | ICD-10-CM

## 2024-11-23 LAB — GLUCOSE, CAPILLARY: Glucose-Capillary: 157 mg/dL — ABNORMAL HIGH (ref 70–99)

## 2024-11-23 LAB — POCT I-STAT, CHEM 8
BUN: 13 mg/dL (ref 8–23)
Calcium, Ion: 1.19 mmol/L (ref 1.15–1.40)
Chloride: 99 mmol/L (ref 98–111)
Creatinine, Ser: 0.6 mg/dL (ref 0.44–1.00)
Glucose, Bld: 119 mg/dL — ABNORMAL HIGH (ref 70–99)
HCT: 36 % (ref 36.0–46.0)
Hemoglobin: 12.2 g/dL (ref 12.0–15.0)
Potassium: 3.5 mmol/L (ref 3.5–5.1)
Sodium: 139 mmol/L (ref 135–145)
TCO2: 26 mmol/L (ref 22–32)

## 2024-11-23 MED ORDER — PHENYLEPHRINE 80 MCG/ML (10ML) SYRINGE FOR IV PUSH (FOR BLOOD PRESSURE SUPPORT)
PREFILLED_SYRINGE | INTRAVENOUS | Status: DC | PRN
  Administered 2024-11-23: 160 ug via INTRAVENOUS
  Administered 2024-11-23 (×2): 80 ug via INTRAVENOUS
  Administered 2024-11-23: 160 ug via INTRAVENOUS

## 2024-11-23 MED ORDER — FENTANYL CITRATE (PF) 100 MCG/2ML IJ SOLN
INTRAMUSCULAR | Status: AC
  Filled 2024-11-23: qty 2

## 2024-11-23 MED ORDER — ACETAMINOPHEN 10 MG/ML IV SOLN: 1000.0000 mg | Freq: Once | INTRAVENOUS | Status: DC | PRN

## 2024-11-23 MED ORDER — ONDANSETRON 4 MG PO TBDP: 4.0000 mg | ORAL_TABLET | Freq: Four times a day (QID) | ORAL | Status: DC | PRN

## 2024-11-23 MED ORDER — FENTANYL CITRATE (PF) 100 MCG/2ML IJ SOLN
25.0000 ug | INTRAMUSCULAR | Status: DC | PRN
  Administered 2024-11-23: 25 ug via INTRAVENOUS

## 2024-11-23 MED ORDER — KETAMINE HCL 50 MG/5ML IJ SOSY
PREFILLED_SYRINGE | INTRAMUSCULAR | Status: DC | PRN
  Administered 2024-11-23: 30 mg via INTRAVENOUS

## 2024-11-23 MED ORDER — ACETAMINOPHEN 500 MG PO TABS
1000.0000 mg | ORAL_TABLET | ORAL | Status: AC
  Administered 2024-11-23: 1000 mg via ORAL

## 2024-11-23 MED ORDER — ACETAMINOPHEN 500 MG PO TABS
ORAL_TABLET | ORAL | Status: AC
  Filled 2024-11-23: qty 2

## 2024-11-23 MED ORDER — ONDANSETRON HCL 4 MG/2ML IJ SOLN
INTRAMUSCULAR | Status: DC | PRN
  Administered 2024-11-23: 4 mg via INTRAVENOUS

## 2024-11-23 MED ORDER — PHENYLEPHRINE HCL-NACL 20-0.9 MG/250ML-% IV SOLN
INTRAVENOUS | Status: DC | PRN
  Administered 2024-11-23: 25 ug/min via INTRAVENOUS

## 2024-11-23 MED ORDER — FENTANYL CITRATE (PF) 100 MCG/2ML IJ SOLN
INTRAMUSCULAR | Status: DC | PRN
  Administered 2024-11-23 (×3): 50 ug via INTRAVENOUS

## 2024-11-23 MED ORDER — CHLORHEXIDINE GLUCONATE CLOTH 2 % EX PADS: 6.0000 | MEDICATED_PAD | Freq: Once | CUTANEOUS | Status: DC

## 2024-11-23 MED ORDER — ROCURONIUM BROMIDE 100 MG/10ML IV SOLN
INTRAVENOUS | Status: DC | PRN
  Administered 2024-11-23: 20 mg via INTRAVENOUS
  Administered 2024-11-23: 60 mg via INTRAVENOUS
  Administered 2024-11-23: 20 mg via INTRAVENOUS

## 2024-11-23 MED ORDER — CHLORHEXIDINE GLUCONATE 0.12 % MT SOLN
OROMUCOSAL | Status: AC
  Filled 2024-11-23: qty 15

## 2024-11-23 MED ORDER — ONDANSETRON HCL 4 MG/2ML IJ SOLN
INTRAMUSCULAR | Status: AC
  Filled 2024-11-23: qty 12

## 2024-11-23 MED ORDER — PHENYLEPHRINE 80 MCG/ML (10ML) SYRINGE FOR IV PUSH (FOR BLOOD PRESSURE SUPPORT)
PREFILLED_SYRINGE | INTRAVENOUS | Status: AC
  Filled 2024-11-23: qty 20

## 2024-11-23 MED ORDER — GABAPENTIN 300 MG PO CAPS
300.0000 mg | ORAL_CAPSULE | ORAL | Status: AC
  Administered 2024-11-23: 300 mg via ORAL

## 2024-11-23 MED ORDER — ONDANSETRON HCL 4 MG/2ML IJ SOLN: 4.0000 mg | Freq: Four times a day (QID) | INTRAMUSCULAR | Status: DC | PRN

## 2024-11-23 MED ORDER — MIDAZOLAM HCL (PF) 2 MG/2ML IJ SOLN
INTRAMUSCULAR | Status: DC | PRN
  Administered 2024-11-23: 1 mg via INTRAVENOUS

## 2024-11-23 MED ORDER — DEXAMETHASONE SOD PHOSPHATE PF 10 MG/ML IJ SOLN
INTRAMUSCULAR | Status: DC | PRN
  Administered 2024-11-23: 5 mg via INTRAVENOUS

## 2024-11-23 MED ORDER — HEMOSTATIC AGENTS (NO CHARGE) OPTIME
TOPICAL | Status: DC | PRN
  Administered 2024-11-23 (×2): 1 via TOPICAL

## 2024-11-23 MED ORDER — ORAL CARE MOUTH RINSE: 15.0000 mL | Freq: Once | OROMUCOSAL | Status: AC

## 2024-11-23 MED ORDER — EPHEDRINE SULFATE-NACL 50-0.9 MG/10ML-% IV SOSY
PREFILLED_SYRINGE | INTRAVENOUS | Status: DC | PRN
  Administered 2024-11-23 (×2): 5 mg via INTRAVENOUS

## 2024-11-23 MED ORDER — OXYCODONE HCL 5 MG PO TABS
ORAL_TABLET | ORAL | Status: AC
  Filled 2024-11-23: qty 1

## 2024-11-23 MED ORDER — ACETAMINOPHEN 325 MG PO TABS
650.0000 mg | ORAL_TABLET | Freq: Three times a day (TID) | ORAL | Status: DC | PRN
  Administered 2024-11-24: 650 mg via ORAL
  Filled 2024-11-23: qty 2

## 2024-11-23 MED ORDER — PROPOFOL 10 MG/ML IV BOLUS
INTRAVENOUS | Status: DC | PRN
  Administered 2024-11-23: 150 mg via INTRAVENOUS

## 2024-11-23 MED ORDER — STERILE WATER FOR IRRIGATION IR SOLN
Status: DC | PRN
  Administered 2024-11-23: 500 mL

## 2024-11-23 MED ORDER — FLUTICASONE PROPIONATE 50 MCG/ACT NA SUSP: 2.0000 | Freq: Every day | NASAL | Status: DC | PRN

## 2024-11-23 MED ORDER — OXYCODONE HCL 5 MG PO TABS
5.0000 mg | ORAL_TABLET | Freq: Once | ORAL | Status: AC | PRN
  Administered 2024-11-23: 5 mg via ORAL

## 2024-11-23 MED ORDER — GABAPENTIN 300 MG PO CAPS
ORAL_CAPSULE | ORAL | Status: AC
  Filled 2024-11-23: qty 1

## 2024-11-23 MED ORDER — SODIUM CHLORIDE 0.9 % IV SOLN: INTRAVENOUS | Status: DC

## 2024-11-23 MED ORDER — MIDAZOLAM HCL 2 MG/2ML IJ SOLN
INTRAMUSCULAR | Status: AC
  Filled 2024-11-23: qty 2

## 2024-11-23 MED ORDER — TECHNETIUM TC 99M TILMANOCEPT KIT
1.0400 | PACK | Freq: Once | INTRAVENOUS | Status: AC | PRN
  Administered 2024-11-23: 1.04 via INTRADERMAL

## 2024-11-23 MED ORDER — BUPIVACAINE-EPINEPHRINE (PF) 0.5% -1:200000 IJ SOLN
INTRAMUSCULAR | Status: AC
  Filled 2024-11-23: qty 30

## 2024-11-23 MED ORDER — OXYCODONE HCL 5 MG PO TABS: 5.0000 mg | ORAL_TABLET | ORAL | Status: DC | PRN

## 2024-11-23 MED ORDER — LORATADINE 10 MG PO TABS: 10.0000 mg | ORAL_TABLET | Freq: Every day | ORAL | Status: DC | PRN

## 2024-11-23 MED ORDER — VENLAFAXINE HCL ER 75 MG PO CP24
75.0000 mg | ORAL_CAPSULE | Freq: Every day | ORAL | Status: DC
  Filled 2024-11-23 (×2): qty 1

## 2024-11-23 MED ORDER — MORPHINE SULFATE (PF) 2 MG/ML IV SOLN: 1.0000 mg | INTRAVENOUS | Status: DC | PRN

## 2024-11-23 MED ORDER — GABAPENTIN 100 MG PO CAPS
300.0000 mg | ORAL_CAPSULE | Freq: Two times a day (BID) | ORAL | Status: DC
  Administered 2024-11-24: 300 mg via ORAL
  Filled 2024-11-23 (×2): qty 3

## 2024-11-23 MED ORDER — AZELASTINE HCL 0.1 % NA SOLN: 2.0000 | Freq: Two times a day (BID) | NASAL | Status: DC | PRN

## 2024-11-23 MED ORDER — EPHEDRINE 5 MG/ML INJ
INTRAVENOUS | Status: AC
  Filled 2024-11-23: qty 20

## 2024-11-23 MED ORDER — ONDANSETRON HCL 4 MG/2ML IJ SOLN: 4.0000 mg | Freq: Once | INTRAMUSCULAR | Status: DC | PRN

## 2024-11-23 MED ORDER — CEFAZOLIN SODIUM-DEXTROSE 2-4 GM/100ML-% IV SOLN
INTRAVENOUS | Status: AC
  Filled 2024-11-23: qty 100

## 2024-11-23 MED ORDER — BUPIVACAINE-EPINEPHRINE (PF) 0.5% -1:200000 IJ SOLN
INTRAMUSCULAR | Status: DC | PRN
  Administered 2024-11-23: 30 mL via PERINEURAL

## 2024-11-23 MED ORDER — LIDOCAINE HCL (PF) 2 % IJ SOLN
INTRAMUSCULAR | Status: AC
  Filled 2024-11-23: qty 15

## 2024-11-23 MED ORDER — DEXAMETHASONE SOD PHOSPHATE PF 10 MG/ML IJ SOLN
INTRAMUSCULAR | Status: AC
  Filled 2024-11-23: qty 1

## 2024-11-23 MED ORDER — ROCURONIUM BROMIDE 10 MG/ML (PF) SYRINGE
PREFILLED_SYRINGE | INTRAVENOUS | Status: AC
  Filled 2024-11-23: qty 10

## 2024-11-23 MED ORDER — DEXMEDETOMIDINE HCL IN NACL 80 MCG/20ML IV SOLN
INTRAVENOUS | Status: AC
  Filled 2024-11-23: qty 20

## 2024-11-23 MED ORDER — SIMVASTATIN 20 MG PO TABS
10.0000 mg | ORAL_TABLET | Freq: Every day | ORAL | Status: DC
  Filled 2024-11-23: qty 1

## 2024-11-23 MED ORDER — KETAMINE HCL 50 MG/5ML IJ SOSY
PREFILLED_SYRINGE | INTRAMUSCULAR | Status: AC
  Filled 2024-11-23: qty 5

## 2024-11-23 MED ORDER — PHENYLEPHRINE HCL-NACL 20-0.9 MG/250ML-% IV SOLN
INTRAVENOUS | Status: AC
  Filled 2024-11-23: qty 250

## 2024-11-23 MED ORDER — OXYCODONE HCL 5 MG/5ML PO SOLN: 5.0000 mg | Freq: Once | ORAL | Status: AC | PRN

## 2024-11-23 MED ORDER — ROCURONIUM BROMIDE 10 MG/ML (PF) SYRINGE
PREFILLED_SYRINGE | INTRAVENOUS | Status: AC
  Filled 2024-11-23: qty 20

## 2024-11-23 MED ORDER — CEFAZOLIN SODIUM-DEXTROSE 2-4 GM/100ML-% IV SOLN
2.0000 g | INTRAVENOUS | Status: AC
  Administered 2024-11-23: 2 g via INTRAVENOUS

## 2024-11-23 MED ORDER — CHLORHEXIDINE GLUCONATE 0.12 % MT SOLN
15.0000 mL | Freq: Once | OROMUCOSAL | Status: AC
  Administered 2024-11-23: 15 mL via OROMUCOSAL

## 2024-11-23 MED ORDER — TRAMADOL HCL 50 MG PO TABS: 50.0000 mg | ORAL_TABLET | Freq: Four times a day (QID) | ORAL | Status: DC | PRN

## 2024-11-23 MED ORDER — LIDOCAINE HCL (CARDIAC) PF 100 MG/5ML IV SOSY
PREFILLED_SYRINGE | INTRAVENOUS | Status: DC | PRN
  Administered 2024-11-23: 80 mg via INTRAVENOUS

## 2024-11-23 MED ORDER — SUGAMMADEX SODIUM 200 MG/2ML IV SOLN
INTRAVENOUS | Status: DC | PRN
  Administered 2024-11-23: 200 mg via INTRAVENOUS

## 2024-11-23 NOTE — Anesthesia Procedure Notes (Signed)
 Procedure Name: Intubation Date/Time: 11/23/2024 11:14 AM  Performed by: Lennie Lamarr HERO, CRNAPre-anesthesia Checklist: Patient identified, Emergency Drugs available, Suction available and Patient being monitored Patient Re-evaluated:Patient Re-evaluated prior to induction Oxygen Delivery Method: Circle System Utilized Preoxygenation: Pre-oxygenation with 100% oxygen Induction Type: IV induction Ventilation: Mask ventilation without difficulty Laryngoscope Size: McGrath and 3 Grade View: Grade I Tube type: Oral Tube size: 7.0 mm Number of attempts: 1 Airway Equipment and Method: Stylet and Oral airway Placement Confirmation: ETT inserted through vocal cords under direct vision, positive ETCO2 and breath sounds checked- equal and bilateral Secured at: 22 cm Tube secured with: Tape Dental Injury: Teeth and Oropharynx as per pre-operative assessment

## 2024-11-23 NOTE — Anesthesia Preprocedure Evaluation (Signed)
"                                    Anesthesia Evaluation  Patient identified by MRN, date of birth, ID band Patient awake    Reviewed: Allergy & Precautions, H&P , NPO status , Patient's Chart, lab work & pertinent test results  Airway Mallampati: II  TM Distance: >3 FB Neck ROM: Full    Dental no notable dental hx.    Pulmonary neg pulmonary ROS   Pulmonary exam normal breath sounds clear to auscultation       Cardiovascular negative cardio ROS Normal cardiovascular exam Rhythm:Regular Rate:Normal     Neuro/Psych negative neurological ROS  negative psych ROS   GI/Hepatic negative GI ROS, Neg liver ROS,,,  Endo/Other  negative endocrine ROSdiabetes    Renal/GU negative Renal ROS  negative genitourinary   Musculoskeletal negative musculoskeletal ROS (+)    Abdominal   Peds negative pediatric ROS (+)  Hematology negative hematology ROS (+)   Anesthesia Other Findings   Reproductive/Obstetrics negative OB ROS                              Anesthesia Physical Anesthesia Plan  ASA: 3  Anesthesia Plan: General   Post-op Pain Management:    Induction: Intravenous  PONV Risk Score and Plan:   Airway Management Planned: Oral ETT  Additional Equipment:   Intra-op Plan:   Post-operative Plan: Extubation in OR  Informed Consent: I have reviewed the patients History and Physical, chart, labs and discussed the procedure including the risks, benefits and alternatives for the proposed anesthesia with the patient or authorized representative who has indicated his/her understanding and acceptance.     Dental advisory given  Plan Discussed with: CRNA  Anesthesia Plan Comments:         Anesthesia Quick Evaluation  "

## 2024-11-23 NOTE — Interval H&P Note (Signed)
 No change. OK to proceed.

## 2024-11-23 NOTE — Op Note (Signed)
 Preoperative diagnosis: Left breast Cancer, RAD51C mutation  Postoperative diagnosis: same.   Procedure: Bilateral simple mastectomy.  Left SLNB  Anesthesia: GETA  Surgeon: Dr. Tye  Wound Classification: Clean  Specimen: Left and right breast, left sentinel lymph node x 1  Complications: None  Estimated Blood Loss: 25 mL   Indications: Patient is a 74 y.o. female who had an abnormal mammogram that on workup with core needle biopsy was found to be cancer.  Status post neoadjuvant treatment.  After discussion of options, the patient elected bilateral mastectomy, left sentinel lymph node biopsy.  Please see H&P for further details.   Operation performed with curative intent:Yes  Tracer(s) used to identify sentinel nodes in the neoadjuvant setting (select all that apply):Radioactive Tracer  All nodes (colored or non-colored) present at the end of a dye-filled lymphatic channel were removed:N/A  All significantly radioactive nodes were removed:Yes  All palpable suspicious nodes were removed:N/A  Biopsy-proven positive nodes marked with clips prior to chemotherapy were identified and removed:N/A   Description of procedure: The patient was brought to the operating room and general anesthesia was induced. A time-out was completed verifying correct patient, procedure, site, positioning, and implant(s) and/or special equipment prior to beginning this procedure. The breast, chest wall, axilla, and upper arm and neck were prepped and draped in the usual sterile fashion.  Left side addressed first. A skin incision was made that encompassed the nipple-areola complex and the previous biopsy scar and passed in an oblique direction across the breast.  Hand-held gamma probe was used to identify the location of the hottest spot in the axilla, thorough the lateral edge of the original incision.  Sharp and blunt Dissection was carried down to subdermal facias. The probe was placed within wound and  again, the point of maximal count was found. Dissection continue until nodule was identified. The probe was placed in contact with the node and 1500 counts were recorded. The node was excised in its entirety.  The bed of the node measured <100 counts.  No additional hot spot was detected. No clinically abnormal nodes were palpated.  Node sent off operative field pending pathology.  Skin flaps were raised in the avascular plane between subcutaneous tissue and breast tissue from the clavicle superiorly, the sternum medially, the anterior rectus sheath inferiorly, and past the lateral border of the pectoralis major muscle laterally. Hemostasis was achieved in the flaps. Next, the breast tissue and underlying pectoralis fascia were excised from the pectoralis major muscle, progressing from medially to laterally. At the lateral border of the pectoralis major muscle, the breast tissue was swung laterally and a lateral pedicle identified where breast tissue gave way to fat of axilla. The lateral pedicle was incised and the specimen removed.   Specimen marked long lateral, short superior and sent to pathology. The wound was irrigated and hemostasis was achieved.  Surgicel powder infused throughout operative field.  Closed suction drain brought into the operative field through a separate stab incision and sutured to the skin with a 3-0 nylon suture. The wound was closed with interrupted 3-0 Vicryl to the subcutaneous layer, followed by staples. The wound was dressed with honeycomb dressing.  Drain site covered with drain sponge and secured with paper tape.  Right side addressed next. Skin flaps were raised in the avascular plane between subcutaneous tissue and breast tissue from the clavicle superiorly, the sternum medially, the anterior rectus sheath inferiorly, and past the lateral border of the pectoralis major muscle laterally. Hemostasis was achieved  in the flaps. Next, the breast tissue and underlying pectoralis  fascia were excised from the pectoralis major muscle, progressing from medially to laterally. At the lateral border of the pectoralis major muscle, the breast tissue was swung laterally and a lateral pedicle identified where breast tissue gave way to fat of axilla. The lateral pedicle was incised and the specimen removed.   Specimen marked long lateral, short superior and sent to pathology. The wound was irrigated and hemostasis was achieved.  Surgicel powder infused through entire operative field.  Closed suction drain brought into the operative field through a separate stab incision and sutured to the skin with a 3-0 nylon suture. The wound was closed with interrupted 3-0 Vicryl to the subcutaneous layer, followed by staples. The wound was dressed with honeycomb dressing.  Drain site covered with drain sponge and secured with paper tape .  The patient tolerated the procedure well and was taken to the postanesthesia care unit in stable condition.  Sponge instrument count correct at end of procedure.

## 2024-11-23 NOTE — Anesthesia Postprocedure Evaluation (Signed)
"   Anesthesia Post Note  Patient: Saramarie T Mullinix  Procedure(s) Performed: MASTECTOMY WITH SENTINEL LYMPH NODE BIOPSY (Left: Breast) MASTECTOMY, SIMPLE (Right: Breast)  Patient location during evaluation: PACU Anesthesia Type: General Level of consciousness: awake and alert Pain management: pain level controlled Vital Signs Assessment: post-procedure vital signs reviewed and stable Respiratory status: spontaneous breathing, nonlabored ventilation, respiratory function stable and patient connected to nasal cannula oxygen Cardiovascular status: blood pressure returned to baseline and stable Postop Assessment: no apparent nausea or vomiting Anesthetic complications: no   No notable events documented.   Last Vitals:  Vitals:   11/23/24 0918 11/23/24 1420  BP: (!) 185/98 (!) 157/81  Pulse: 98 (!) 106  Resp: 15 16  Temp: (!) 36.3 C (!) 36.1 C  SpO2: 98% 100%    Last Pain:  Vitals:   11/23/24 1420  PainSc: 0-No pain                 Fairy A Brisha Mccabe      "

## 2024-11-23 NOTE — Transfer of Care (Signed)
 Immediate Anesthesia Transfer of Care Note  Patient: Teresa [name]  Procedure(s) Performed: MASTECTOMY WITH SENTINEL LYMPH NODE BIOPSY (Left: Breast) MASTECTOMY, SIMPLE (Right: Breast)  Patient Location: PACU  Anesthesia Type:General  Level of Consciousness: drowsy and patient cooperative  Airway & Oxygen Therapy: Patient Spontanous Breathing and Patient connected to face mask oxygen  Post-op Assessment: Report given to RN, Post -op Vital signs reviewed and stable, and Patient moving all extremities X 4  Post vital signs: Reviewed and stable  Last Vitals:  Vitals Value Taken Time  BP 157/81 11/23/24 14:20  Temp 36.1 C 11/23/24 14:20  Pulse 103 11/23/24 14:26  Resp 23 11/23/24 14:26  SpO2 100 % 11/23/24 14:26  Vitals shown include unfiled device data.  Last Pain:  Vitals:   11/23/24 0918  PainSc: 0-No pain         Complications: No notable events documented.

## 2024-11-24 ENCOUNTER — Observation Stay

## 2024-11-24 ENCOUNTER — Other Ambulatory Visit: Payer: Self-pay

## 2024-11-24 ENCOUNTER — Encounter: Payer: Self-pay | Admitting: Surgery

## 2024-11-24 DIAGNOSIS — C50412 Malignant neoplasm of upper-outer quadrant of left female breast: Secondary | ICD-10-CM | POA: Diagnosis not present

## 2024-11-24 LAB — CBC
HCT: 32.4 % — ABNORMAL LOW (ref 36.0–46.0)
Hemoglobin: 10.6 g/dL — ABNORMAL LOW (ref 12.0–15.0)
MCH: 29.6 pg (ref 26.0–34.0)
MCHC: 32.7 g/dL (ref 30.0–36.0)
MCV: 90.5 fL (ref 80.0–100.0)
Platelets: 233 10*3/uL (ref 150–400)
RBC: 3.58 MIL/uL — ABNORMAL LOW (ref 3.87–5.11)
RDW: 16 % — ABNORMAL HIGH (ref 11.5–15.5)
WBC: 13.6 10*3/uL — ABNORMAL HIGH (ref 4.0–10.5)
nRBC: 0 % (ref 0.0–0.2)

## 2024-11-24 LAB — BASIC METABOLIC PANEL WITH GFR
Anion gap: 10 (ref 5–15)
BUN: 7 mg/dL — ABNORMAL LOW (ref 8–23)
CO2: 26 mmol/L (ref 22–32)
Calcium: 8.6 mg/dL — ABNORMAL LOW (ref 8.9–10.3)
Chloride: 100 mmol/L (ref 98–111)
Creatinine, Ser: 0.48 mg/dL (ref 0.44–1.00)
GFR, Estimated: >60 mL/min
Glucose, Bld: 98 mg/dL (ref 70–99)
Potassium: 3.5 mmol/L (ref 3.5–5.1)
Sodium: 136 mmol/L (ref 135–145)

## 2024-11-24 MED ORDER — HYDROCODONE-ACETAMINOPHEN 5-325 MG PO TABS
1.0000 | ORAL_TABLET | Freq: Three times a day (TID) | ORAL | 0 refills | Status: AC | PRN
  Filled 2024-11-24: qty 10, 4d supply, fill #0

## 2024-11-24 NOTE — Discharge Summary (Signed)
 [facility]-General Surgery  SURGICAL DISCHARGE SUMMARY  Patient ID: Teresa [name] MRN: [id number] DOB/AGE: 25-Jan-1951 74 y.o.  Admit date: 11/23/2024 Discharge date: 11/24/2024  Discharge Diagnoses Patient Active Problem List   Diagnosis Date Noted   Breast cancer (HCC) 11/23/2024   Genetic testing 10/12/2024   RAD51C gene mutation positive 10/12/2024   Generalized weakness 08/13/2024   Leukocytosis 08/12/2024   Lactic acidosis 08/12/2024   Serum total bilirubin elevated 08/12/2024   Depression with anxiety 08/12/2024   Malignant neoplasm of upper-outer quadrant of left breast in female, estrogen receptor negative (HCC) 06/30/2024   Acute encephalopathy 05/13/2024   SIRS (systemic inflammatory response syndrome) (HCC) 05/13/2024   Type 2 diabetes mellitus with peripheral neuropathy (HCC) 05/13/2024   B12 deficiency 05/13/2024   Lumbar radiculopathy 02/20/2015   Headache 02/20/2015   Elevated CPK 02/20/2015   Cystitis, acute 04/02/2014   Breast cancer screening 01/22/2014   Preventative health care 12/12/2012   Status post lumbar laminectomy 10/06/2012   History of falling 10/06/2012   Head injury 11/29/2008   GERD 12/23/2006   NUMBNESS, leg 12/23/2006   Dyslipidemia 11/02/2006   Allergic rhinitis 09/28/2006   HOT FLASHES 09/28/2006   Backache 09/28/2006    Consultants None   Procedures Bilateral simple mastectomy with left SLNB    Hospital Course:  Patient was scheduled for a bilateral mastectomy with left sentinel lymph node biopsy for malignant neoplasm of left upper outer quadrant of left breast s/p neoadjuvant chemo. Patient was taken to the operating room on 11/23/2024 for surgery. Surgery went well. Patient tolerated procedure.  Patient is tolerating regular diet post-op. Reported some pain around left axilla but otherwise, doing well. Surgical incisions are clean and dry with skin staples intact and honeycomb dressing in place. Patient is clear from  surgical standpoint.  Patient will follow-up outpatient with Dr. Tye in one week.      Physical Examination:  Constitutional: alert, in no acute distress Pulmonary: CTA bilaterally, normal breath sounds Cardiac: regular rate and rhythm Skin: surgical incisions are clean and dry, honeycomb dressing in place with breast binder. No obvious hematoma or seroma. Minimal output in both drains, appear darker likely due to surgicel   Allergies as of 11/24/2024       Reactions   Aleve [naproxen Sodium] Swelling   Facial swelling   Aspirin Nausea Only   REACTION: nausea        Medication List     STOP taking these medications    traMADol  50 MG tablet Commonly known as: Ultram        TAKE these medications    acetaminophen  500 MG tablet Commonly known as: TYLENOL  Take 1 tablet (500 mg total) by mouth every 4 (four) hours as needed.   azelastine  0.1 % nasal spray Commonly known as: ASTELIN  Place 2 sprays into both nostrils 2 (two) times daily. Use in each nostril as directed What changed:  when to take this reasons to take this   cefdinir  300 MG capsule Commonly known as: OMNICEF  Take 1 capsule (300 mg total) by mouth 2 (two) times daily.   cetirizine  10 MG tablet Commonly known as: ZYRTEC  Take 1 tablet (10 mg total) by mouth daily. What changed:  when to take this reasons to take this   dexamethasone  4 MG tablet Commonly known as: DECADRON  Take 2 tabs by mouth 2 times daily starting day before chemo. Then take 2 tabs daily for 2 days starting day after chemo. Take with food.   fluticasone  50  MCG/ACT nasal spray Commonly known as: FLONASE  Place 2 sprays into both nostrils daily. What changed:  when to take this reasons to take this   HYDROcodone -acetaminophen  5-325 MG tablet Commonly known as: NORCO/VICODIN Take 1 tablet by mouth every 8 (eight) hours as needed for up to 3 days for moderate pain (pain score 4-6) or severe pain (pain score 7-10).    lidocaine -prilocaine  cream Commonly known as: EMLA  Apply to affected area once   LORazepam  0.5 MG tablet Commonly known as: ATIVAN  Take 1 tablet (0.5 mg total) by mouth every 8 (eight) hours as needed for anxiety.   multivitamin with minerals Tabs tablet Take 1 tablet by mouth daily.   ondansetron  8 MG tablet Commonly known as: Zofran  Take 1 tablet (8 mg total) by mouth every 8 (eight) hours as needed for nausea or vomiting. Start on the third day after chemotherapy.   potassium chloride  10 MEQ tablet Commonly known as: KLOR-CON  M Take 1 tablet (10 mEq total) by mouth daily.   prochlorperazine  10 MG tablet Commonly known as: COMPAZINE  Take 1 tablet (10 mg total) by mouth every 6 (six) hours as needed for nausea or vomiting.   simvastatin  10 MG tablet Commonly known as: ZOCOR  Take 10 mg by mouth daily.   venlafaxine  XR 75 MG 24 hr capsule Commonly known as: EFFEXOR -XR Take 75 mg by mouth daily.          Follow-up Information     Sakai, Isami, DO Follow up in 1 week(s).   Specialties: General Surgery, Surgery Why: 1 week s/p simple mastectomy with left SLNB Contact information: [address] [place] [address] [phone]                  Time spent on discharge management including discussion of hospital course, clinical condition, outpatient instructions, prescriptions, and follow up with the patient and members of the medical team: >30 minutes  Sanaya Gwilliam Barrientos PA-C

## 2024-11-24 NOTE — TOC Transition Note (Signed)
 Transition of Care [facility]) - Discharge Note   Patient Details  Name: Teresa [name] MRN: [id number] Date of Birth: 1951/02/14  [facility] CM/SW Contact:  Victory Jackquline RAMAN, RN Phone Number: 11/24/2024, 11:14 AM   Clinical Narrative:    RNCM met with patient and he husband at bedside. RNCM introduced myself and my role and explained that discharge planning would be discussed. Patient states that she doesn't need any DME. She already has equipment at home (RW, [employer], [facility] and Wheelchair).  Patient's husband will be taking her home at the time of discharge. Patient's husband voice concern and need for HH/RN and Aide to assist with patient. MD made aware, new orders put n and referral's sent via EPIC to [facility] agencies. Pt has discharge orders, no further concerns. RNCM signing off.   Final next level of care: Home/Self Care Barriers to Discharge: Barriers Resolved   Patient Goals and CMS Choice            Discharge Placement                Patient to be transferred to facility by: Husband Name of family member notified: SYLVESTER Patient and family notified of of transfer: 11/24/24  Discharge Plan and Services Additional resources added to the After Visit Summary for                                       Social Drivers of Health (SDOH) Interventions SDOH Screenings   Food Insecurity: No Food Insecurity (11/23/2024)  Housing: Low Risk (11/23/2024)  Transportation Needs: No Transportation Needs (11/23/2024)  Utilities: Not At Risk (11/23/2024)  Depression (PHQ2-9): Low Risk (10/31/2024)  Financial Resource Strain: Patient Declined (12/07/2023)   Received from [facility]  Social Connections: Socially Integrated (11/23/2024)  Tobacco Use: Unknown (11/23/2024)  Health Literacy: Adequate Health Literacy (07/05/2024)     Readmission Risk Interventions     No data to display

## 2024-11-24 NOTE — Discharge Instructions (Addendum)
" °  Diet: Resume home heart healthy regular diet.   Activity: No heavy lifting >20 pounds (children, pets, laundry, garbage) or strenuous activity until follow-up, but light activity and walking are encouraged. Do not drive or drink alcohol if taking narcotic pain medications.  Wound care: May shower with soapy water  and pat dry (do not rub incisions), but no baths or submerging incision underwater until follow-up. (no swimming) May remove honeycomb dressing in 48-72 hrs. Keep binder in place to prevent seroma or hematoma. Record drain output daily.  Medications: Resume all home medications. For mild to moderate pain: acetaminophen  (Tylenol ) or ibuprofen  (if no kidney disease). Combining Tylenol  with alcohol can substantially increase your risk of causing liver disease. Narcotic pain medications, if prescribed, can be used for severe pain, though may cause nausea, constipation, and drowsiness. Do not combine Tylenol  and Norco within a 6 hour period as Norco contains Tylenol . If you do not need the narcotic pain medication, you do not need to fill the prescription.  Call office [phone]) at any time if any questions, worsening pain, fevers/chills, bleeding, drainage from incision site, or other concerns.        JP Drain [employer] this sheet to all of your post-operative appointments while you have your drains. Please measure your drains by CC's or ML's. Make sure you drain and measure your JP Drains 3 times per day. At the end of each day, add up totals for the left side and add up totals for the right side.    ( 9 am )     ( 3 pm )        ( 9 pm )                Date L  R  L  R  L  R  Total L/R                                                                                                                                                                                       "

## 2024-11-24 NOTE — Plan of Care (Signed)
" °  Problem: Neurological: Goal: Will regain or maintain usual level of consciousness Outcome: Progressing   Problem: Respiratory: Goal: Respiratory status will improve Outcome: Progressing   "

## 2024-11-27 LAB — SURGICAL PATHOLOGY

## 2025-01-09 ENCOUNTER — Inpatient Hospital Stay

## 2025-01-09 ENCOUNTER — Inpatient Hospital Stay: Admitting: Oncology
